# Patient Record
Sex: Female | Born: 1965 | Race: White | Hispanic: No | Marital: Single | Smoking: Former smoker
Health system: Southern US, Community
[De-identification: ages and names within clinical notes are randomized; demographics above are authoritative.]

## PROBLEM LIST (undated history)

## (undated) DIAGNOSIS — I639 Cerebral infarction, unspecified: Secondary | ICD-10-CM

## (undated) DIAGNOSIS — I509 Heart failure, unspecified: Secondary | ICD-10-CM

## (undated) DIAGNOSIS — J449 Chronic obstructive pulmonary disease, unspecified: Secondary | ICD-10-CM

## (undated) DIAGNOSIS — I10 Essential (primary) hypertension: Secondary | ICD-10-CM

## (undated) DIAGNOSIS — N189 Chronic kidney disease, unspecified: Secondary | ICD-10-CM

## (undated) DIAGNOSIS — E119 Type 2 diabetes mellitus without complications: Secondary | ICD-10-CM

## (undated) DIAGNOSIS — F0390 Unspecified dementia without behavioral disturbance: Secondary | ICD-10-CM

## (undated) DIAGNOSIS — D649 Anemia, unspecified: Secondary | ICD-10-CM

## (undated) DIAGNOSIS — R4701 Aphasia: Secondary | ICD-10-CM

---

## 2019-05-28 ENCOUNTER — Emergency Department (HOSPITAL_COMMUNITY): Payer: Medicaid Other

## 2019-05-28 ENCOUNTER — Encounter (HOSPITAL_COMMUNITY): Payer: Self-pay | Admitting: Student

## 2019-05-28 ENCOUNTER — Emergency Department (HOSPITAL_COMMUNITY)
Admission: EM | Admit: 2019-05-28 | Discharge: 2019-05-29 | Disposition: A | Discharge status: Home / Self Care | Payer: Medicaid Other | Attending: Emergency Medicine | Admitting: Emergency Medicine

## 2019-05-28 ENCOUNTER — Other Ambulatory Visit: Payer: Self-pay

## 2019-05-28 DIAGNOSIS — J449 Chronic obstructive pulmonary disease, unspecified: Secondary | ICD-10-CM | POA: Insufficient documentation

## 2019-05-28 DIAGNOSIS — E1122 Type 2 diabetes mellitus with diabetic chronic kidney disease: Secondary | ICD-10-CM | POA: Insufficient documentation

## 2019-05-28 DIAGNOSIS — N189 Chronic kidney disease, unspecified: Secondary | ICD-10-CM | POA: Insufficient documentation

## 2019-05-28 DIAGNOSIS — R519 Headache, unspecified: Secondary | ICD-10-CM | POA: Insufficient documentation

## 2019-05-28 DIAGNOSIS — I69351 Hemiplegia and hemiparesis following cerebral infarction affecting right dominant side: Secondary | ICD-10-CM | POA: Diagnosis not present

## 2019-05-28 DIAGNOSIS — Z7401 Bed confinement status: Secondary | ICD-10-CM | POA: Diagnosis not present

## 2019-05-28 DIAGNOSIS — I509 Heart failure, unspecified: Secondary | ICD-10-CM | POA: Diagnosis not present

## 2019-05-28 HISTORY — DX: Cerebral infarction, unspecified: I63.9

## 2019-05-28 HISTORY — DX: Heart failure, unspecified: I50.9

## 2019-05-28 HISTORY — DX: Chronic kidney disease, unspecified: N18.9

## 2019-05-28 HISTORY — DX: Type 2 diabetes mellitus without complications: E11.9

## 2019-05-28 HISTORY — DX: Chronic obstructive pulmonary disease, unspecified: J44.9

## 2019-05-28 HISTORY — DX: Essential (primary) hypertension: I10

## 2019-05-28 LAB — BASIC METABOLIC PANEL
Anion gap: 9 (ref 5–15)
BUN: 24 mg/dL — ABNORMAL HIGH (ref 6–20)
CO2: 25 mmol/L (ref 22–32)
Calcium: 8.8 mg/dL — ABNORMAL LOW (ref 8.9–10.3)
Chloride: 106 mmol/L (ref 98–111)
Creatinine, Ser: 1.16 mg/dL — ABNORMAL HIGH (ref 0.44–1.00)
GFR calc Af Amer: >60 mL/min (ref >60)
GFR calc non Af Amer: 53 mL/min — ABNORMAL LOW (ref >60)
Glucose, Bld: 131 mg/dL — ABNORMAL HIGH (ref 70–99)
Potassium: 5 mmol/L (ref 3.5–5.1)
Sodium: 140 mmol/L (ref 135–145)

## 2019-05-28 LAB — CBC
HCT: 44.5 % (ref 36.0–46.0)
Hemoglobin: 14.7 g/dL (ref 12.0–15.0)
MCH: 30.4 pg (ref 26.0–34.0)
MCHC: 33 g/dL (ref 30.0–36.0)
MCV: 91.9 fL (ref 80.0–100.0)
Platelets: 314 10*3/uL (ref 150–400)
RBC: 4.84 MIL/uL (ref 3.87–5.11)
RDW: 12.3 % (ref 11.5–15.5)
WBC: 9.6 10*3/uL (ref 4.0–10.5)
nRBC: 0 % (ref 0.0–0.2)

## 2019-05-28 LAB — TROPONIN I (HIGH SENSITIVITY)
Troponin I (High Sensitivity): 7 ng/L (ref <18)
Troponin I (High Sensitivity): 9 ng/L (ref <18)

## 2019-05-28 MED ORDER — ACETAMINOPHEN 325 MG PO TABS
650.0000 mg | ORAL_TABLET | Freq: Once | ORAL | Status: AC
  Administered 2019-05-28: 650 mg via ORAL
  Filled 2019-05-28: qty 2

## 2019-05-28 MED ORDER — DIPHENHYDRAMINE HCL 50 MG/ML IJ SOLN
12.5000 mg | Freq: Once | INTRAMUSCULAR | Status: AC
  Administered 2019-05-28: 12.5 mg via INTRAVENOUS
  Filled 2019-05-28: qty 1

## 2019-05-28 MED ORDER — PROCHLORPERAZINE EDISYLATE 10 MG/2ML IJ SOLN
10.0000 mg | Freq: Once | INTRAMUSCULAR | Status: AC
  Administered 2019-05-28: 23:00:00 10 mg via INTRAVENOUS
  Filled 2019-05-28: qty 2

## 2019-05-28 NOTE — ED Provider Notes (Signed)
[facility] EMERGENCY DEPARTMENT Provider Note   CSN: [id number] Arrival date & time: 05/28/19  1729     History Chief Complaint  Patient presents with   Headache    Julie Donovan is a 54 y.o. 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 Patient describes the headache as a tightness/pressure to the frontal region, gradual onset, steady progression, currently an 8/10 in severity, worse with bright lights, no alleviating factors. No intervention PTA. She states she has not necessarily had a headache like this in the past. Reports no acute change in her baseline R sided deficits s/p prior CVA. Denies visual disturbance, acute numbness/weakness, dizziness, syncope, nausea, vomiting, fever, or chills. Triage note documents chest pain with aspirin & NTG prior to arrival by EMS, however when I ask patient she denies any chest pain currently or any prior to arrival, she is alert & oriented.   HPI     No past medical history on file.  There are no problems to display for this patient.   History reviewed. No pertinent surgical history.   OB History   No obstetric history on file.     No family history on file.  Social History   Tobacco Use   Smoking status: Not on file  Substance Use Topics   Alcohol use: Not on file   Drug use: Not on file    Home Medications Prior to Admission medications   Not on File    Allergies    Patient has no allergy information on record.  Review of Systems   Review of Systems  Constitutional: Negative for chills and fever.  Respiratory: Negative for shortness of breath.   Cardiovascular: Negative for chest pain, palpitations and leg swelling.  Gastrointestinal: Negative for abdominal pain,  nausea and vomiting.  Genitourinary: Negative for dysuria.  Neurological: Positive for headaches. Negative for dizziness, seizures, syncope, facial asymmetry and numbness.       Positive for chronic right sided deficits s/p CVA.   All other systems reviewed and are negative.   Physical Exam Updated Vital Signs BP 128/78 (BP Location: Right Arm)    Pulse 79    Temp 98.1 F (36.7 C) (Oral)    Resp (!) 21    SpO2 99%   Physical Exam Vitals and nursing note reviewed.  Constitutional:      General: She is not in acute distress.    Appearance: She is well-developed. She is not toxic-appearing.  HENT:     Head: Normocephalic and atraumatic.  Eyes:     General:        Right eye: No discharge.        Left eye: No discharge.     Extraocular Movements: Extraocular movements intact.     Conjunctiva/sclera: Conjunctivae normal.     Pupils: Pupils are equal, round, and reactive to light.     Comments: No proptosis.   Cardiovascular:     Rate and Rhythm: Normal rate and regular rhythm.     Comments: 2+ symmetric radial pulses Pulmonary:     Effort: Pulmonary effort is normal. No respiratory distress.     Breath sounds: Normal breath sounds. No wheezing, rhonchi or rales.  Abdominal:     General: There is no distension.  Palpations: Abdomen is soft.     Tenderness: There is no abdominal tenderness.  Musculoskeletal:     Cervical back: Neck supple. No rigidity.  Skin:    General: Skin is warm and dry.     Findings: No rash.  Neurological:     Mental Status: She is alert.     Comments: Clear speech. No obvious facial droop. R sided deficits present which patient states are baseline. Intact finger to nose with LUE, unable to perform with RUE with her residual deficits.   Psychiatric:        Behavior: Behavior normal.    ED Results / Procedures / Treatments   Labs (all labs ordered are listed, but only abnormal results are displayed) Labs Reviewed  BASIC METABOLIC PANEL -  Abnormal; Notable for the following components:      Result Value   Glucose, Bld 131 (*)    BUN 24 (*)    Creatinine, Ser 1.16 (*)    Calcium 8.8 (*)    GFR calc non Af Amer 53 (*)    All other components within normal limits  CBC  TROPONIN I (HIGH SENSITIVITY)  TROPONIN I (HIGH SENSITIVITY)    EKG EKG Interpretation  Date/Time:  Monday May 28 2019 17:37:44 EDT Ventricular Rate:  84 PR Interval:  222 QRS Duration: 82 QT Interval:  410 QTC Calculation: 484 R Axis:   -15 Text Interpretation: Sinus rhythm with 1st degree A-V block Low voltage QRS Inferior infarct , age undetermined Cannot rule out Anterior infarct , age undetermined Abnormal ECG No old tracing to compare Confirmed by Deno Etienne [id number]) on 05/28/2019 10:07:44 PM   Radiology DG Chest 2 View  Result Date: 05/28/2019 CLINICAL DATA:  54 year old female with chest pain. EXAM: CHEST - 2 VIEW COMPARISON:  None FINDINGS: No focal consolidation, pleural effusion or pneumothorax. Mild cardiomegaly. No acute osseous pathology. IMPRESSION: No active cardiopulmonary disease. Electronically Signed   By: Anner Crete M.D.   On: 05/28/2019 23:35   CT Head Wo Contrast  Result Date: 05/28/2019 CLINICAL DATA:  54 year old female with headache. EXAM: CT HEAD WITHOUT CONTRAST TECHNIQUE: Contiguous axial images were obtained from the base of the skull through the vertex without intravenous contrast. COMPARISON:  None. FINDINGS: Brain: There is mild age-related atrophy and chronic microvascular ischemic changes. Small bilateral basal ganglia old lacunar infarcts. There is no acute intracranial hemorrhage. No mass effect or midline shift. No extra-axial fluid collection. Vascular: No hyperdense vessel or unexpected calcification. Skull: Normal. Negative for fracture or focal lesion. Sinuses/Orbits: No acute finding. Other: None IMPRESSION: 1. No acute intracranial pathology. 2. Mild age-related atrophy and chronic microvascular ischemic  changes. Electronically Signed   By: Anner Crete M.D.   On: 05/28/2019 23:12    Procedures Procedures (including critical care time)  Medications Ordered in ED Medications  prochlorperazine (COMPAZINE) injection 10 mg (10 mg Intravenous Given 05/28/19 2314)  diphenhydrAMINE (BENADRYL) injection 12.5 mg (12.5 mg Intravenous Given 05/28/19 2314)  acetaminophen (TYLENOL) tablet 650 mg (650 mg Oral Given 05/28/19 2314)    ED Course  I have reviewed the triage vital signs and the nursing notes.  Pertinent labs & imaging results that were available during my care of the patient were reviewed by me and considered in my medical decision making (see chart for details).    MDM Rules/Calculators/A&P                     Patient presents to the ED with  chief complaint of headache on my assessment, per triage chest pain, but patient denies this to me. Patient is nontoxic, vitals without significant abnormality. Benign physical exam, R sided deficits from prior CVA noted.   Additional history obtained:  Additional history obtained from review of nursing notes as well as rehab facility notes brought by patient (medical history, med list, allergies)  EKG: No STEMI.  Lab Tests:  Reviewed and interpreted labs, which included:  CBC: No anemia or leukocytosis.  BMP: Mild elevation of BUN/creatinine - no prior on record but has hx of CKD. Mild hypocalcemia.  Troponin: 7,9 No significant elevation.    Imaging Studies ordered:  Triage ordered CXR and I have ordered CT head wo contrast, I independently visualized and interpreted imaging which showed:  CXR: No active cardiopulmonary disease CT head wo: 1. No acute intracranial pathology. 2. Mild age-related atrophy and chronic microvascular ischemic changes.  ED Course:  22:10: Initial assessment of patient, complaining of 8/10 severity headache- no hx of prior headache that is similar therefore CT head wo contrast ordered, will trial migraine cocktail.    00:30: RE-EVAL: Patient woken from sleep, states she is feeling much better and is ready to go home.   Headache:  No acute neuro deficits today (baseline s/p ischemic CVA) low suspicion for acute stroke No acute bleed or obvious masses on head CT No sudden onset headache, low suspicion for  [facility].  No fever or nuchal rigidity, low suspicion for meningitis.  No visual disturbance or proptosis with diffuse frontal headache therefore low suspicion for acute angle closure glaucoma, dural venous sinus thrombosis, or temporal arteritis.  Chest pain: Initially reported, but denied to me, EKG without STEMI, troponins without significant elevation, doubt ACS.  Symmetric pulses, no widened mediastinum on chest x-ray, doubt dissection.  She is not hypoxic or tachycardic, doubt PE.  Chest x-ray without pneumonia, pneumothorax, or fluid overload.  Overall reassuring work-up in the emergency department, patient feeling improved, she overall appears appropriate for discharge at this time.  Recommended Tylenol as needed for discomfort. I discussed results, treatment plan, need for follow-up, and return precautions with the patient. Provided opportunity for questions, patient confirmed understanding and is in agreement with plan.   Findings and plan of care discussed with supervising physician Dr. Adela Lank who is in agreement.   Portions of this note were generated with Scientist, clinical (histocompatibility and immunogenetics). Dictation errors may occur despite best attempts at proofreading.  Final Clinical Impression(s) / ED Diagnoses Final diagnoses:  Acute nonintractable headache, unspecified headache type    Rx / DC Orders ED Discharge Orders    None       Cherly Anderson, PA-C 05/29/19 0114    Melene Plan, DO 05/29/19 1502

## 2019-05-28 NOTE — ED Notes (Signed)
Pt transported to xray 

## 2019-05-28 NOTE — ED Notes (Signed)
Pt. returned from XR. 

## 2019-05-28 NOTE — ED Notes (Signed)
Pt states that labs can't be drawn from her hands

## 2019-05-28 NOTE — ED Triage Notes (Addendum)
Pt from [facility] [facility] with ems for chest pain that started about 30 mins prior to EMS arrival, pt bedbound due to hx of stroke. Pt alert, oriented at baseline with confusion. VSS. 18G LAC Pt given 324 ASA and 1 nitro with some relief of pain

## 2019-05-29 NOTE — Discharge Instructions (Signed)
You were seen in the emergency department this evening for a headache, there was also some concern that you are having chest pain prior to arrival.  Your work-up in the ER was overall reassuring.  Your head CT and chest x-ray did not show any acute abnormalities.  Your labs were overall reassuring, your kidney function was mildly elevated, please have this rechecked by your primary care provider within 1 to 2 weeks.  You were given a migraine cocktail in the ER with improvement.  Please take Tylenol as needed per over-the-counter dosing to help with discomfort should it return.  Please follow-up with your primary care provider within 3 days for reevaluation.  Return to the ER for new or worsening symptoms or any other concerns.

## 2019-05-29 NOTE — ED Notes (Signed)
[facility] called to transfer home

## 2020-04-25 DIAGNOSIS — R6521 Severe sepsis with septic shock: Secondary | ICD-10-CM

## 2020-04-25 DIAGNOSIS — N1330 Unspecified hydronephrosis: Secondary | ICD-10-CM

## 2020-04-25 DIAGNOSIS — A419 Sepsis, unspecified organism: Secondary | ICD-10-CM

## 2020-04-25 HISTORY — DX: Sepsis, unspecified organism: A41.9

## 2020-04-25 HISTORY — DX: Unspecified hydronephrosis: N13.30

## 2020-04-25 HISTORY — DX: Severe sepsis with septic shock: R65.21

## 2020-05-10 ENCOUNTER — Other Ambulatory Visit: Payer: Self-pay

## 2020-05-10 ENCOUNTER — Inpatient Hospital Stay (HOSPITAL_COMMUNITY)
Admission: EM | Admit: 2020-05-10 | Discharge: 2020-05-22 | DRG: 853 | Disposition: A | Discharge status: Skilled Nursing Facility | Payer: Medicaid Other | Source: Skilled Nursing Facility | Attending: Internal Medicine | Admitting: Internal Medicine

## 2020-05-10 ENCOUNTER — Emergency Department (HOSPITAL_COMMUNITY): Payer: Medicaid Other

## 2020-05-10 ENCOUNTER — Encounter (HOSPITAL_COMMUNITY): Payer: Self-pay

## 2020-05-10 DIAGNOSIS — Z20822 Contact with and (suspected) exposure to covid-19: Secondary | ICD-10-CM | POA: Diagnosis present

## 2020-05-10 DIAGNOSIS — Z79899 Other long term (current) drug therapy: Secondary | ICD-10-CM

## 2020-05-10 DIAGNOSIS — E1165 Type 2 diabetes mellitus with hyperglycemia: Secondary | ICD-10-CM | POA: Diagnosis present

## 2020-05-10 DIAGNOSIS — A4159 Other Gram-negative sepsis: Principal | ICD-10-CM | POA: Diagnosis present

## 2020-05-10 DIAGNOSIS — E8809 Other disorders of plasma-protein metabolism, not elsewhere classified: Secondary | ICD-10-CM

## 2020-05-10 DIAGNOSIS — G9341 Metabolic encephalopathy: Secondary | ICD-10-CM | POA: Diagnosis not present

## 2020-05-10 DIAGNOSIS — D696 Thrombocytopenia, unspecified: Secondary | ICD-10-CM | POA: Diagnosis present

## 2020-05-10 DIAGNOSIS — I69351 Hemiplegia and hemiparesis following cerebral infarction affecting right dominant side: Secondary | ICD-10-CM

## 2020-05-10 DIAGNOSIS — I69320 Aphasia following cerebral infarction: Secondary | ICD-10-CM

## 2020-05-10 DIAGNOSIS — Z794 Long term (current) use of insulin: Secondary | ICD-10-CM

## 2020-05-10 DIAGNOSIS — R1312 Dysphagia, oropharyngeal phase: Secondary | ICD-10-CM | POA: Diagnosis not present

## 2020-05-10 DIAGNOSIS — R739 Hyperglycemia, unspecified: Secondary | ICD-10-CM

## 2020-05-10 DIAGNOSIS — E872 Acidosis, unspecified: Secondary | ICD-10-CM

## 2020-05-10 DIAGNOSIS — I313 Pericardial effusion (noninflammatory): Secondary | ICD-10-CM | POA: Diagnosis present

## 2020-05-10 DIAGNOSIS — N1831 Chronic kidney disease, stage 3a: Secondary | ICD-10-CM | POA: Diagnosis present

## 2020-05-10 DIAGNOSIS — F32A Depression, unspecified: Secondary | ICD-10-CM | POA: Diagnosis present

## 2020-05-10 DIAGNOSIS — J9811 Atelectasis: Secondary | ICD-10-CM | POA: Diagnosis present

## 2020-05-10 DIAGNOSIS — Z87891 Personal history of nicotine dependence: Secondary | ICD-10-CM

## 2020-05-10 DIAGNOSIS — Z6841 Body Mass Index (BMI) 40.0 and over, adult: Secondary | ICD-10-CM

## 2020-05-10 DIAGNOSIS — G9349 Other encephalopathy: Secondary | ICD-10-CM | POA: Diagnosis not present

## 2020-05-10 DIAGNOSIS — E118 Type 2 diabetes mellitus with unspecified complications: Secondary | ICD-10-CM

## 2020-05-10 DIAGNOSIS — J449 Chronic obstructive pulmonary disease, unspecified: Secondary | ICD-10-CM | POA: Diagnosis present

## 2020-05-10 DIAGNOSIS — Z419 Encounter for procedure for purposes other than remedying health state, unspecified: Secondary | ICD-10-CM

## 2020-05-10 DIAGNOSIS — J9601 Acute respiratory failure with hypoxia: Secondary | ICD-10-CM | POA: Diagnosis not present

## 2020-05-10 DIAGNOSIS — Z960 Presence of urogenital implants: Secondary | ICD-10-CM

## 2020-05-10 DIAGNOSIS — F0390 Unspecified dementia without behavioral disturbance: Secondary | ICD-10-CM | POA: Diagnosis present

## 2020-05-10 DIAGNOSIS — D6489 Other specified anemias: Secondary | ICD-10-CM | POA: Diagnosis present

## 2020-05-10 DIAGNOSIS — I5032 Chronic diastolic (congestive) heart failure: Secondary | ICD-10-CM | POA: Diagnosis present

## 2020-05-10 DIAGNOSIS — Z7982 Long term (current) use of aspirin: Secondary | ICD-10-CM

## 2020-05-10 DIAGNOSIS — E785 Hyperlipidemia, unspecified: Secondary | ICD-10-CM | POA: Diagnosis present

## 2020-05-10 DIAGNOSIS — N189 Chronic kidney disease, unspecified: Secondary | ICD-10-CM

## 2020-05-10 DIAGNOSIS — A419 Sepsis, unspecified organism: Secondary | ICD-10-CM | POA: Diagnosis present

## 2020-05-10 DIAGNOSIS — E1122 Type 2 diabetes mellitus with diabetic chronic kidney disease: Secondary | ICD-10-CM | POA: Diagnosis present

## 2020-05-10 DIAGNOSIS — N201 Calculus of ureter: Secondary | ICD-10-CM

## 2020-05-10 DIAGNOSIS — G40909 Epilepsy, unspecified, not intractable, without status epilepticus: Secondary | ICD-10-CM | POA: Diagnosis present

## 2020-05-10 DIAGNOSIS — I69391 Dysphagia following cerebral infarction: Secondary | ICD-10-CM

## 2020-05-10 DIAGNOSIS — R6521 Severe sepsis with septic shock: Secondary | ICD-10-CM | POA: Diagnosis present

## 2020-05-10 DIAGNOSIS — E876 Hypokalemia: Secondary | ICD-10-CM | POA: Diagnosis present

## 2020-05-10 DIAGNOSIS — Z91040 Latex allergy status: Secondary | ICD-10-CM

## 2020-05-10 DIAGNOSIS — I130 Hypertensive heart and chronic kidney disease with heart failure and stage 1 through stage 4 chronic kidney disease, or unspecified chronic kidney disease: Secondary | ICD-10-CM | POA: Diagnosis present

## 2020-05-10 DIAGNOSIS — R4182 Altered mental status, unspecified: Secondary | ICD-10-CM

## 2020-05-10 DIAGNOSIS — N132 Hydronephrosis with renal and ureteral calculous obstruction: Secondary | ICD-10-CM | POA: Diagnosis present

## 2020-05-10 DIAGNOSIS — R6520 Severe sepsis without septic shock: Secondary | ICD-10-CM

## 2020-05-10 DIAGNOSIS — D72829 Elevated white blood cell count, unspecified: Secondary | ICD-10-CM

## 2020-05-10 DIAGNOSIS — N136 Pyonephrosis: Secondary | ICD-10-CM | POA: Diagnosis present

## 2020-05-10 DIAGNOSIS — N179 Acute kidney failure, unspecified: Secondary | ICD-10-CM | POA: Diagnosis present

## 2020-05-10 DIAGNOSIS — E46 Unspecified protein-calorie malnutrition: Secondary | ICD-10-CM | POA: Diagnosis present

## 2020-05-10 LAB — CBC WITH DIFFERENTIAL/PLATELET
Abs Immature Granulocytes: 0.36 10*3/uL — ABNORMAL HIGH (ref 0.00–0.07)
Basophils Absolute: 0.1 10*3/uL (ref 0.0–0.1)
Basophils Relative: 0 %
Eosinophils Absolute: 0 10*3/uL (ref 0.0–0.5)
Eosinophils Relative: 0 %
HCT: 45.1 % (ref 36.0–46.0)
Hemoglobin: 15 g/dL (ref 12.0–15.0)
Immature Granulocytes: 2 %
Lymphocytes Relative: 4 %
Lymphs Abs: 0.8 10*3/uL (ref 0.7–4.0)
MCH: 30.7 pg (ref 26.0–34.0)
MCHC: 33.3 g/dL (ref 30.0–36.0)
MCV: 92.2 fL (ref 80.0–100.0)
Monocytes Absolute: 1.2 10*3/uL — ABNORMAL HIGH (ref 0.1–1.0)
Monocytes Relative: 6 %
Neutro Abs: 18.6 10*3/uL — ABNORMAL HIGH (ref 1.7–7.7)
Neutrophils Relative %: 88 %
Platelets: 237 10*3/uL (ref 150–400)
RBC: 4.89 MIL/uL (ref 3.87–5.11)
RDW: 12.8 % (ref 11.5–15.5)
WBC: 21 10*3/uL — ABNORMAL HIGH (ref 4.0–10.5)
nRBC: 0.1 % (ref 0.0–0.2)

## 2020-05-10 LAB — CBG MONITORING, ED: Glucose-Capillary: 135 mg/dL — ABNORMAL HIGH (ref 70–99)

## 2020-05-10 LAB — PROTIME-INR
INR: 1.4 — ABNORMAL HIGH (ref 0.8–1.2)
Prothrombin Time: 16.9 seconds — ABNORMAL HIGH (ref 11.4–15.2)

## 2020-05-10 LAB — COMPREHENSIVE METABOLIC PANEL
ALT: 37 U/L (ref 0–44)
AST: 29 U/L (ref 15–41)
Albumin: 3.1 g/dL — ABNORMAL LOW (ref 3.5–5.0)
Alkaline Phosphatase: 96 U/L (ref 38–126)
Anion gap: 11 (ref 5–15)
BUN: 32 mg/dL — ABNORMAL HIGH (ref 6–20)
CO2: 20 mmol/L — ABNORMAL LOW (ref 22–32)
Calcium: 8.5 mg/dL — ABNORMAL LOW (ref 8.9–10.3)
Chloride: 105 mmol/L (ref 98–111)
Creatinine, Ser: 2.5 mg/dL — ABNORMAL HIGH (ref 0.44–1.00)
GFR, Estimated: 22 mL/min — ABNORMAL LOW (ref >60)
Glucose, Bld: 145 mg/dL — ABNORMAL HIGH (ref 70–99)
Potassium: 3.7 mmol/L (ref 3.5–5.1)
Sodium: 136 mmol/L (ref 135–145)
Total Bilirubin: 0.5 mg/dL (ref 0.3–1.2)
Total Protein: 6.4 g/dL — ABNORMAL LOW (ref 6.5–8.1)

## 2020-05-10 LAB — RESP PANEL BY RT-PCR (FLU A&B, COVID) ARPGX2
Influenza A by PCR: NEGATIVE
Influenza B by PCR: NEGATIVE
SARS Coronavirus 2 by RT PCR: NEGATIVE

## 2020-05-10 LAB — LACTIC ACID, PLASMA: Lactic Acid, Venous: 2.5 mmol/L (ref 0.5–1.9)

## 2020-05-10 MED ORDER — VANCOMYCIN HCL 2000 MG/400ML IV SOLN
2000.0000 mg | Freq: Once | INTRAVENOUS | Status: AC
  Administered 2020-05-10: 2000 mg via INTRAVENOUS
  Filled 2020-05-10: qty 400

## 2020-05-10 MED ORDER — METRONIDAZOLE IN NACL 5-0.79 MG/ML-% IV SOLN
500.0000 mg | Freq: Once | INTRAVENOUS | Status: AC
  Administered 2020-05-10: 500 mg via INTRAVENOUS
  Filled 2020-05-10: qty 100

## 2020-05-10 MED ORDER — SODIUM CHLORIDE 0.9 % IV SOLN: 2.0000 g | INTRAVENOUS | Status: DC

## 2020-05-10 MED ORDER — MIDAZOLAM HCL 2 MG/2ML IJ SOLN
INTRAMUSCULAR | Status: AC
  Filled 2020-05-10: qty 2

## 2020-05-10 MED ORDER — VANCOMYCIN VARIABLE DOSE PER UNSTABLE RENAL FUNCTION (PHARMACIST DOSING): Status: DC

## 2020-05-10 MED ORDER — LACTATED RINGERS IV BOLUS (SEPSIS)
1000.0000 mL | Freq: Once | INTRAVENOUS | Status: AC
  Administered 2020-05-10: 1000 mL via INTRAVENOUS

## 2020-05-10 MED ORDER — FENTANYL CITRATE (PF) 250 MCG/5ML IJ SOLN
INTRAMUSCULAR | Status: AC
  Filled 2020-05-10: qty 5

## 2020-05-10 MED ORDER — VANCOMYCIN HCL 1000 MG/200ML IV SOLN
1000.0000 mg | Freq: Once | INTRAVENOUS | Status: DC
  Filled 2020-05-10: qty 200

## 2020-05-10 MED ORDER — PROPOFOL 10 MG/ML IV BOLUS
INTRAVENOUS | Status: AC
  Filled 2020-05-10: qty 20

## 2020-05-10 MED ORDER — LACTATED RINGERS IV SOLN: INTRAVENOUS | Status: AC

## 2020-05-10 MED ORDER — SODIUM CHLORIDE 0.9 % IV SOLN
2.0000 g | Freq: Once | INTRAVENOUS | Status: DC
  Filled 2020-05-10: qty 2

## 2020-05-10 NOTE — ED Provider Notes (Signed)
[facility] Note   CSN: [id number] Arrival date & time: 05/10/20  1853     History Chief Complaint  Patient presents with  . Altered Mental Status    Julie AcheBarbara Donovan is a 55 y.o. female with a past medical history of COPD, CKD, CHF, CVA, hypertension, DM 2, right-sided chronic deficits who presents today for evaluation of altered mental status.  Level 5 caveat altered mental status.  Patient reportedly has been having worsening mental status since yesterday.  On paper sent with her from the facility it appears that she had a 1 view KUB performed today due to vomiting dark material which showed concern for a possible early ileus. EMS reports that initially patient's blood pressure was in the 70s systolic.  She was given 500 cc IV fluids after which her blood pressure improved into the 90s.  She additionally was febrile with EMS and was reportedly given 650 of Tylenol. At baseline patient is alert and oriented to person, place, and time.  HPI     Past Medical History:  Diagnosis Date  . CHF (congestive heart failure) (HCC)   . CKD (chronic kidney disease)   . COPD (chronic obstructive pulmonary disease) (HCC)   . CVA (cerebral vascular accident) (HCC)   . Hypertension   . Type 2 diabetes mellitus (HCC)     There are no problems to display for this patient.   History reviewed. No pertinent surgical history.   OB History   No obstetric history on file.     No family history on file.  Social History   Tobacco Use  . Smoking status: Former Games developermoker  . Smokeless tobacco: Former Engineer, waterUser  Substance Use Topics  . Alcohol use: Not Currently  . Drug use: Not Currently    Home Medications Prior to Admission medications   Medication Sig Start Date End Date Taking? Authorizing Provider  acetaminophen (TYLENOL) 500 MG tablet Take 1,000 mg by mouth in the morning and at bedtime.   Yes [provider]  amLODipine (NORVASC)  10 MG tablet Take 10 mg by mouth daily.   Yes [provider]  ARIPiprazole (ABILIFY) 2 MG tablet Take 2 mg by mouth at bedtime.   Yes [provider]  aspirin 325 MG EC tablet Take 325 mg by mouth daily.   Yes [provider]  atorvastatin (LIPITOR) 80 MG tablet Take 80 mg by mouth at bedtime.   Yes [provider]  bisacodyl (DULCOLAX) 10 MG suppository Place 10 mg rectally as needed for moderate constipation.   Yes [provider]  calcium carbonate (TUMS - DOSED IN MG ELEMENTAL CALCIUM) 500 MG chewable tablet Chew 2 tablets by mouth 2 (two) times daily.   Yes [provider]  carvedilol (COREG) 12.5 MG tablet Take 12.5 mg by mouth 2 (two) times daily with a meal.   Yes [provider]  cholecalciferol (VITAMIN D3) 25 MCG (1000 UNIT) tablet Take 1,000 Units by mouth daily.   Yes [provider]  cyclobenzaprine (FLEXERIL) 10 MG tablet Take 10 mg by mouth at bedtime.   Yes [provider]  dicyclomine (BENTYL) 10 MG capsule Take 10 mg by mouth every 8 (eight) hours as needed for spasms.   Yes [provider]  FLUoxetine (PROZAC) 20 MG capsule Take 60 mg by mouth at bedtime.   Yes [provider]  hydrALAZINE (APRESOLINE) 25 MG tablet Take 75 mg by mouth 3 (three) times daily as needed (  for Hypertenstion (Hold for systolic less than 140 and diastolic less than 95)).   Yes [provider]  hyoscyamine (LEVSIN) 0.125 MG tablet Take 0.125 mg by mouth every 8 (eight) hours as needed (for IBS).   Yes [provider]  insulin glargine (LANTUS) 100 UNIT/ML injection Inject 41 Units into the skin daily.   Yes [provider]  insulin lispro (HUMALOG) 100 UNIT/ML injection Inject 2-12 Units into the skin 4 (four) times daily -  before meals and at bedtime. If 201-250=2 units 251-300=4 units 301-350=6 units 351-400=8 units 401-450=10 units 451-500=12 units If Glucose Reads High=14  units and recheck   Yes [provider]  levETIRAcetam (KEPPRA) 500 MG tablet Take 500 mg by mouth 2 (two) times daily.   Yes [provider]  linagliptin (TRADJENTA) 5 MG TABS tablet Take 5 mg by mouth daily.   Yes [provider]  lisinopril (ZESTRIL) 5 MG tablet Take 5 mg by mouth daily.   Yes [provider]  polyethylene glycol powder (GLYCOLAX/MIRALAX) 17 GM/SCOOP powder Take 17 g by mouth daily.   Yes [provider]  promethazine (PHENERGAN) 25 MG tablet Take 25 mg by mouth every 6 (six) hours as needed for nausea or vomiting.   Yes [provider]  senna-docusate (SENOKOT-S) 8.6-50 MG tablet Take 2 tablets by mouth 2 (two) times daily.   Yes [provider]  aspirin 81 MG chewable tablet Chew 81 mg by mouth daily. Patient not taking: Reported on 05/10/2020    [provider]  calcium-vitamin D (OSCAL WITH D) 500-200 MG-UNIT tablet Take 1 tablet by mouth daily with breakfast. Patient not taking: Reported on 05/10/2020    [provider]  vitamin B-12 (CYANOCOBALAMIN) 500 MCG tablet Take 500 mcg by mouth daily. Patient not taking: Reported on 05/10/2020    [provider]    Allergies    Latex  Review of Systems   Review of Systems  Unable to perform ROS: Mental status change    Physical Exam Updated Vital Signs BP 116/86   Pulse (!) 115   Temp 100.1 F (37.8 C) (Rectal)   Resp 20   Ht 5\' 4"  (1.626 m)   Wt 106.6 kg   SpO2 97%   BMI 40.34 kg/m   Physical Exam Vitals and nursing note reviewed.  Constitutional:      Appearance: She is obese. She is ill-appearing and diaphoretic.     Comments: Patient is cool to the touch and diaphoretic.  Her eyes are open and she tracks.    HENT:     Head: Normocephalic and atraumatic.  Eyes:     General: No scleral icterus.       Right eye: No discharge.        Left eye: No discharge.     Conjunctiva/sclera: Conjunctivae normal.   Cardiovascular:     Rate and Rhythm: Regular rhythm. Tachycardia present.  Pulmonary:     Effort: Pulmonary effort is normal. No respiratory distress.     Breath sounds: No stridor.  Abdominal:     General: There is no distension.     Tenderness: There is no abdominal tenderness. There is no guarding.  Musculoskeletal:        General: No deformity.     Cervical back: Normal range of motion and neck supple.  Skin:    General: Skin is cool and moist.     Coloration: Skin is pale.  Neurological:     GCS:  GCS eye subscore is 4. GCS verbal subscore is 2. GCS motor subscore is 6.     Motor: Abnormal muscle tone present.     Comments: Patient is awake.  She is able to follow commands including wiggling fingers and toes on the left side (chronic right-sided deficits from prior CVA) and is able to close her eyes when instructed to.  She quietly mumble something incomprehensible when asked questions.  Unable to clearly tell me her name.  Psychiatric:     Comments: AMS, unable to assess     ED Results / Procedures / Treatments   Labs (all labs ordered are listed, but only abnormal results are displayed) Labs Reviewed  COMPREHENSIVE METABOLIC PANEL - Abnormal; Notable for the following components:      Result Value   CO2 20 (*)    Glucose, Bld 145 (*)    BUN 32 (*)    Creatinine, Ser 2.50 (*)    Calcium 8.5 (*)    Total Protein 6.4 (*)    Albumin 3.1 (*)    GFR, Estimated 22 (*)    All other components within normal limits  LACTIC ACID, PLASMA - Abnormal; Notable for the following components:   Lactic Acid, Venous 2.5 (*)    All other components within normal limits  CBC WITH DIFFERENTIAL/PLATELET - Abnormal; Notable for the following components:   WBC 21.0 (*)    Neutro Abs 18.6 (*)    Monocytes Absolute 1.2 (*)    Abs Immature Granulocytes 0.36 (*)    All other components within normal limits  PROTIME-INR - Abnormal; Notable for the following components:   Prothrombin Time  16.9 (*)    INR 1.4 (*)    All other components within normal limits  CBG MONITORING, ED - Abnormal; Notable for the following components:   Glucose-Capillary 135 (*)    All other components within normal limits  RESP PANEL BY RT-PCR (FLU A&B, COVID) ARPGX2  CULTURE, BLOOD (ROUTINE X 2)  CULTURE, BLOOD (ROUTINE X 2)  URINE CULTURE  LACTIC ACID, PLASMA  URINALYSIS, ROUTINE W REFLEX MICROSCOPIC  I-STAT CHEM 8, ED    EKG None  Radiology CT Abdomen Pelvis Wo Contrast  Result Date: 05/10/2020 CLINICAL DATA:  Sepsis, nausea, vomiting, altered mental status EXAM: CT CHEST, ABDOMEN AND PELVIS WITHOUT CONTRAST TECHNIQUE: Multidetector CT imaging of the chest, abdomen and pelvis was performed following the standard protocol without IV contrast. COMPARISON:  None. FINDINGS: CT CHEST FINDINGS Cardiovascular: Cardiomegaly. Moderate pericardial effusion. Probable stents in the left anterior descending coronary artery. Aorta normal caliber. Mediastinum/Nodes: No mediastinal, hilar, or axillary adenopathy. Trachea and esophagus are unremarkable. Thyroid unremarkable. Lungs/Pleura: Bibasilar atelectasis. No effusions or other confluent opacities. Musculoskeletal: Chest wall soft tissues are unremarkable. No acute bony abnormality. CT ABDOMEN PELVIS FINDINGS Hepatobiliary: No focal liver abnormality is seen. Status post cholecystectomy. No biliary dilatation. Pancreas: No focal abnormality or ductal dilatation. Spleen: No focal abnormality.  Normal size. Adrenals/Urinary Tract: Adrenal glands are unremarkable. Numerous bilateral renal stones the large stone on the right layering dependently in the renal pelvis measuring 9 mm and the largest on the left layering dependently in the renal pelvis measuring 7 mm. Proximal right ureteral stone measures 8 mm. Mild right hydronephrosis. 2 mm distal left ureteral stone. Mild pelvicaliectasis on the left. Urinary bladder decompressed, unremarkable. Stomach/Bowel: Stomach,  large and small bowel grossly unremarkable. Vascular/Lymphatic: No evidence of aneurysm or adenopathy. Scattered aortoiliac atherosclerosis. Reproductive: Uterus and adnexa unremarkable.  No mass. Other: Trace  free fluid in the pelvis.  No free air. Musculoskeletal: No acute bony abnormality. IMPRESSION: Cardiomegaly.  Moderate pericardial effusion. Bibasilar atelectasis. Bilateral nephrolithiasis. Mild right hydronephrosis due to 8 mm proximal right ureteral stone. Mild left pelvicaliectasis due to 2 mm left UVJ stone. Electronically Signed   By: Charlett Nose M.D.   On: 05/10/2020 22:04   CT Head Wo Contrast  Result Date: 05/10/2020 CLINICAL DATA:  Altered mental status, sepsis EXAM: CT HEAD WITHOUT CONTRAST TECHNIQUE: Contiguous axial images were obtained from the base of the skull through the vertex without intravenous contrast. COMPARISON:  05/28/2019 FINDINGS: Brain: Bilateral basal ganglia and periventricular white matter lacunar infarcts, stable. There is atrophy and chronic small vessel disease changes. No acute intracranial abnormality. Specifically, no hemorrhage, hydrocephalus, mass lesion, acute infarction, or significant intracranial injury. Vascular: No hyperdense vessel or unexpected calcification. Skull: No acute calvarial abnormality. Sinuses/Orbits: Visualized paranasal sinuses and mastoids clear. Orbital soft tissues unremarkable. Other: None IMPRESSION: Bilateral lacunar infarcts as above, chronic. Atrophy, chronic microvascular disease. No acute intracranial abnormality. Electronically Signed   By: Charlett Nose M.D.   On: 05/10/2020 21:48   CT Chest Wo Contrast  Result Date: 05/10/2020 CLINICAL DATA:  Sepsis, nausea, vomiting, altered mental status EXAM: CT CHEST, ABDOMEN AND PELVIS WITHOUT CONTRAST TECHNIQUE: Multidetector CT imaging of the chest, abdomen and pelvis was performed following the standard protocol without IV contrast. COMPARISON:  None. FINDINGS: CT CHEST FINDINGS  Cardiovascular: Cardiomegaly. Moderate pericardial effusion. Probable stents in the left anterior descending coronary artery. Aorta normal caliber. Mediastinum/Nodes: No mediastinal, hilar, or axillary adenopathy. Trachea and esophagus are unremarkable. Thyroid unremarkable. Lungs/Pleura: Bibasilar atelectasis. No effusions or other confluent opacities. Musculoskeletal: Chest wall soft tissues are unremarkable. No acute bony abnormality. CT ABDOMEN PELVIS FINDINGS Hepatobiliary: No focal liver abnormality is seen. Status post cholecystectomy. No biliary dilatation. Pancreas: No focal abnormality or ductal dilatation. Spleen: No focal abnormality.  Normal size. Adrenals/Urinary Tract: Adrenal glands are unremarkable. Numerous bilateral renal stones the large stone on the right layering dependently in the renal pelvis measuring 9 mm and the largest on the left layering dependently in the renal pelvis measuring 7 mm. Proximal right ureteral stone measures 8 mm. Mild right hydronephrosis. 2 mm distal left ureteral stone. Mild pelvicaliectasis on the left. Urinary bladder decompressed, unremarkable. Stomach/Bowel: Stomach, large and small bowel grossly unremarkable. Vascular/Lymphatic: No evidence of aneurysm or adenopathy. Scattered aortoiliac atherosclerosis. Reproductive: Uterus and adnexa unremarkable.  No mass. Other: Trace free fluid in the pelvis.  No free air. Musculoskeletal: No acute bony abnormality. IMPRESSION: Cardiomegaly.  Moderate pericardial effusion. Bibasilar atelectasis. Bilateral nephrolithiasis. Mild right hydronephrosis due to 8 mm proximal right ureteral stone. Mild left pelvicaliectasis due to 2 mm left UVJ stone. Electronically Signed   By: Charlett Nose M.D.   On: 05/10/2020 22:04   DG Chest Portable 1 View  Result Date: 05/10/2020 CLINICAL DATA:  Sepsis EXAM: PORTABLE CHEST 1 VIEW COMPARISON:  05/28/2019 FINDINGS: Cardiomegaly. Low lung volumes with bibasilar atelectasis. No effusions or  overt edema. No acute bony abnormality. IMPRESSION: Low lung volumes.  Cardiomegaly, bibasilar atelectasis. Electronically Signed   By: Charlett Nose M.D.   On: 05/10/2020 20:00    Procedures .Critical Care Performed by: Cristina Gong, PA-C Authorized by: Cristina Gong, PA-C   Critical care provider statement:    Critical care time (minutes):  45   Critical care was time spent personally by me on the following activities:  Discussions with consultants, evaluation of patient's response to  treatment, examination of patient, ordering and performing treatments and interventions, ordering and review of laboratory studies, ordering and review of radiographic studies, pulse oximetry, re-evaluation of patient's condition, obtaining history from patient or surrogate and review of old charts     Medications Ordered in ED Medications  lactated ringers infusion ( Intravenous New Bag/Given 05/10/20 2202)  ceFEPIme (MAXIPIME) 2 g in sodium chloride 0.9 % 100 mL IVPB (has no administration in time range)  vancomycin (VANCOREADY) IVPB 2000 mg/400 mL (2,000 mg Intravenous Incomplete 05/10/20 2211)  vancomycin variable dose per unstable renal function (pharmacist dosing) (has no administration in time range)  ceFEPIme (MAXIPIME) 2 g in sodium chloride 0.9 % 100 mL IVPB (has no administration in time range)  lactated ringers bolus 1,000 mL (0 mLs Intravenous Stopped 05/10/20 2010)  metroNIDAZOLE (FLAGYL) IVPB 500 mg (0 mg Intravenous Stopped 05/10/20 2111)    ED Course  I have reviewed the triage vital signs and the nursing notes.  Pertinent labs & imaging results that were available during my care of the patient were reviewed by me and considered in my medical decision making (see chart for details).  Clinical Course as of 05/10/20 2343  Sat May 10, 2020  1932 Patient's mental status is already starting to improve.  Initially she could not tell me her name, now she is able to tell me her name  and that she is at the hospital. [EH]  1951 WBC(!): 21.0 [EH]  2021 I went to reevaluate patient, she had pulled out her IV.  Nursing was able to get a 22 in her left hand.  IV fluids infusing. [EH]  2240 CT Abdomen Pelvis Wo Contrast CT scan shows bilateral obstructing stones.  [EH]  2255 I spoke with Dr. Mena Goes of urology who requested patient be kept n.p.o. and admitted.  PCCM consult is placed. [EH]  2315 I spoke with Dr. Gaynell Face of PCCM, they do not feel like at this time patient requires ICU.  [EH]    Clinical Course User Index [EH] Norman Clay   MDM Rules/Calculators/A&P                         Patient is a 55 year old woman who presents today for evaluation of altered mental status since yesterday that has reportedly been worsening.  On initial exam she has decreased GCS however is protecting her airway.  Her mental status improved with IV fluids.  EKG was ordered on arrival.  She is called a code sepsis in the field which I confirmed on arrival.  She did not not receive 30 mL/kg of LR given that while in my care she was not hypotensive and her lactic was not over 4. CT head is obtained without acute abnormalities. CBC shows significant leukocytosis at 21 with neutrophils of 18.6.  CMP shows glucose is elevated at 145.  She has a AKI with her creatinine at 2.5 and GFR of 22, 11 months ago her creatinine was 1.16.  Her lactic acid is slightly elevated at 2.5.  CT chest, abdomen, and pelvis is obtained due to altered mental status and concern for sepsis as she was febrile, hypotensive, and tachycardic prior to arrival.  CT chest shows concern for a moderate pericardial effusion.  CT abdomen shows concern for bilateral ureteral stones, question full obstruction.  She has not produced urine   I spoke with Dr. Mena Goes of urology who will see the patient in consult, requested that she be  kept n.p.o.  I did speak with Dr. Gaynell Face PCCM who feels like at this point  patient can be safely managed by medicine.  I spoke with hosptialist who will see the patient for admission.  I did speak with the patient's healthcare power of attorney as listed on her documents from the SNF. The actual healthcare power of attorney document is not here as far as I am aware.  She is updated on plan and results.  The patient appears reasonably stabilized for admission considering the current resources, flow, and capabilities available in the ED at this time, and I doubt any other [facility] requiring further screening and/or treatment in the ED prior to admission assuming timely admission and bed placement.  Note: Portions of this report may have been transcribed using voice recognition software. Every effort was made to ensure accuracy; however, inadvertent computerized transcription errors may be present    Final Clinical Impression(s) / ED Diagnoses Final diagnoses:  Altered mental status, unspecified altered mental status type  Bilateral ureteral calculi  Sepsis with encephalopathy without septic shock, due to unspecified organism [facility])  AKI (acute kidney injury) [facility])    Rx / DC Orders ED Discharge Orders    None       Cristina Gong, PA-C 05/11/20 0024    Margarita Grizzle, MD 05/11/20 1445

## 2020-05-10 NOTE — Consult Note (Addendum)
Consult: bilateral ureteral stones, hydronephrosis, sepsis Requested by: Dr. Margarita Grizzle   History of Present Illness: 55 yo female presented from facility with worsening mental status since 05/09/2020. On paper sent with her from the facility it appears that she had a 1 view KUB performed due to vomiting dark material which showed concern for a possible early ileus. EMS reports that initially patient's blood pressure was in the 70s systolic. CT A/P 05/10/2020 revealed an 8 mm right proximal stone and a 2 mm left distal stone with R>L hydroureteronephrosis. She also had bilateral renal stones of about 9 mm. Cr 2.5 with prior Cr 1.16. Code sepsis initiated.   Past medical history of COPD, CKD, CHF, CVA, hypertension, DM 2, right-sided chronic deficits. Her POA is her sister Jeanine Luz at [phone].   Level 5 caveat altered mental status.   Past Medical History:  Diagnosis Date  . CHF (congestive heart failure) (HCC)   . CKD (chronic kidney disease)   . COPD (chronic obstructive pulmonary disease) (HCC)   . CVA (cerebral vascular accident) (HCC)   . Hypertension   . Type 2 diabetes mellitus (HCC)    History reviewed. No pertinent surgical history.  Home Medications:  (Not in a hospital admission)  Allergies:  Allergies  Allergen Reactions  . Latex Other (See Comments)    On MAR    History reviewed. No pertinent family history. Social History:  reports that she has quit smoking. She has quit using smokeless tobacco. She reports previous alcohol use. She reports previous drug use.  ROS: A complete review of systems was performed.  All systems are negative except for pertinent findings as noted. Review of Systems  Unable to perform ROS: Mental acuity     Physical Exam:  Vital signs in last 24 hours: Temp:  [99.3 F (37.4 C)-100.1 F (37.8 C)] 100.1 F (37.8 C) (04/16 2208) Pulse Rate:  [115-124] 115 (04/16 2000) Resp:  [20] 20 (04/16 2000) BP: (101-116)/(79-86) 116/86  (04/16 1945) SpO2:  [94 %-97 %] 97 % (04/16 2000) Weight:  [106.6 kg] 106.6 kg (04/16 2207) General:  Her eyes are open but she does not respond. Tepid.  HEENT: Normocephalic, atraumatic Cardiovascular: Tachycardic Lungs: Tachypneic but no distress, reg effort.  Abdomen: Soft, nontender, nondistended, no abdominal masses Extremities: + edema Neurologic: Grossly intact  Laboratory Data:  Results for orders placed or performed during the hospital encounter of 05/10/20 (from the past 24 hour(s))  Comprehensive metabolic panel     Status: Abnormal   Collection Time: 05/10/20  7:18 PM  Result Value Ref Range   Sodium 136 135 - 145 mmol/L   Potassium 3.7 3.5 - 5.1 mmol/L   Chloride 105 98 - 111 mmol/L   CO2 20 (L) 22 - 32 mmol/L   Glucose, Bld 145 (H) 70 - 99 mg/dL   BUN 32 (H) 6 - 20 mg/dL   Creatinine, Ser 4.17 (H) 0.44 - 1.00 mg/dL   Calcium 8.5 (L) 8.9 - 10.3 mg/dL   Total Protein 6.4 (L) 6.5 - 8.1 g/dL   Albumin 3.1 (L) 3.5 - 5.0 g/dL   AST 29 15 - 41 U/L   ALT 37 0 - 44 U/L   Alkaline Phosphatase 96 38 - 126 U/L   Total Bilirubin 0.5 0.3 - 1.2 mg/dL   GFR, Estimated 22 (L) >60 mL/min   Anion gap 11 5 - 15  Lactic acid, plasma     Status: Abnormal   Collection Time: 05/10/20  7:18 PM  Result Value Ref Range   Lactic Acid, Venous 2.5 (HH) 0.5 - 1.9 mmol/L  CBC with Differential     Status: Abnormal   Collection Time: 05/10/20  7:18 PM  Result Value Ref Range   WBC 21.0 (H) 4.0 - 10.5 K/uL   RBC 4.89 3.87 - 5.11 MIL/uL   Hemoglobin 15.0 12.0 - 15.0 g/dL   HCT 12.8 78.6 - 76.7 %   MCV 92.2 80.0 - 100.0 fL   MCH 30.7 26.0 - 34.0 pg   MCHC 33.3 30.0 - 36.0 g/dL   RDW 20.9 47.0 - 96.2 %   Platelets 237 150 - 400 K/uL   nRBC 0.1 0.0 - 0.2 %   Neutrophils Relative % 88 %   Neutro Abs 18.6 (H) 1.7 - 7.7 K/uL   Lymphocytes Relative 4 %   Lymphs Abs 0.8 0.7 - 4.0 K/uL   Monocytes Relative 6 %   Monocytes Absolute 1.2 (H) 0.1 - 1.0 K/uL   Eosinophils Relative 0 %    Eosinophils Absolute 0.0 0.0 - 0.5 K/uL   Basophils Relative 0 %   Basophils Absolute 0.1 0.0 - 0.1 K/uL   Immature Granulocytes 2 %   Abs Immature Granulocytes 0.36 (H) 0.00 - 0.07 K/uL  Protime-INR     Status: Abnormal   Collection Time: 05/10/20  7:18 PM  Result Value Ref Range   Prothrombin Time 16.9 (H) 11.4 - 15.2 seconds   INR 1.4 (H) 0.8 - 1.2  CBG monitoring, ED     Status: Abnormal   Collection Time: 05/10/20  7:29 PM  Result Value Ref Range   Glucose-Capillary 135 (H) 70 - 99 mg/dL  Resp Panel by RT-PCR (Flu A&B, Covid) Nasopharyngeal Swab     Status: None   Collection Time: 05/10/20  7:45 PM   Specimen: Nasopharyngeal Swab; Nasopharyngeal(NP) swabs in vial transport medium  Result Value Ref Range   SARS Coronavirus 2 by RT PCR NEGATIVE NEGATIVE   Influenza A by PCR NEGATIVE NEGATIVE   Influenza B by PCR NEGATIVE NEGATIVE  Urinalysis, Routine w reflex microscopic     Status: Abnormal   Collection Time: 05/10/20 11:41 PM  Result Value Ref Range   Color, Urine YELLOW YELLOW   APPearance CLOUDY (A) CLEAR   Specific Gravity, Urine 1.020 1.005 - 1.030   pH 7.5 5.0 - 8.0   Glucose, UA NEGATIVE NEGATIVE mg/dL   Hgb urine dipstick LARGE (A) NEGATIVE   Bilirubin Urine NEGATIVE NEGATIVE   Ketones, ur NEGATIVE NEGATIVE mg/dL   Protein, ur 836 (A) NEGATIVE mg/dL   Nitrite NEGATIVE NEGATIVE   Leukocytes,Ua SMALL (A) NEGATIVE  Urinalysis, Microscopic (reflex)     Status: Abnormal   Collection Time: 05/10/20 11:41 PM  Result Value Ref Range   RBC / HPF 21-50 0 - 5 RBC/hpf   WBC, UA 21-50 0 - 5 WBC/hpf   Bacteria, UA MANY (A) NONE SEEN   Squamous Epithelial / LPF 0-5 0 - 5   Recent Results (from the past 240 hour(s))  Resp Panel by RT-PCR (Flu A&B, Covid) Nasopharyngeal Swab     Status: None   Collection Time: 05/10/20  7:45 PM   Specimen: Nasopharyngeal Swab; Nasopharyngeal(NP) swabs in vial transport medium  Result Value Ref Range Status   SARS Coronavirus 2 by RT PCR  NEGATIVE NEGATIVE Final    Comment: (NOTE) SARS-CoV-2 target nucleic acids are NOT DETECTED.  The SARS-CoV-2 RNA is generally detectable in upper respiratory specimens during the acute phase of  infection. The lowest concentration of SARS-CoV-2 viral copies this assay can detect is 138 copies/mL. A negative result does not preclude SARS-Cov-2 infection and should not be used as the sole basis for treatment or other patient management decisions. A negative result may occur with  improper specimen collection/handling, submission of specimen other than nasopharyngeal swab, presence of viral mutation(s) within the areas targeted by this assay, and inadequate number of viral copies(<138 copies/mL). A negative result must be combined with clinical observations, patient history, and epidemiological information. The expected result is Negative.  Fact Sheet for Patients:  BloggerCourse.com  Fact Sheet for Healthcare Providers:  SeriousBroker.it  This test is no t yet approved or cleared by the [place] FDA and  has been authorized for detection and/or diagnosis of SARS-CoV-2 by FDA under an Emergency Use Authorization (EUA). This EUA will remain  in effect (meaning this test can be used) for the duration of the COVID-19 declaration under Section 564(b)(1) of the Act, 21 U.S.C.section 360bbb-3(b)(1), unless the authorization is terminated  or revoked sooner.       Influenza A by PCR NEGATIVE NEGATIVE Final   Influenza B by PCR NEGATIVE NEGATIVE Final    Comment: (NOTE) The Xpert Xpress SARS-CoV-2/FLU/RSV plus assay is intended as an aid in the diagnosis of influenza from Nasopharyngeal swab specimens and should not be used as a sole basis for treatment. Nasal washings and aspirates are unacceptable for Xpert Xpress SARS-CoV-2/FLU/RSV testing.  Fact Sheet for Patients: BloggerCourse.com  Fact Sheet for  Healthcare Providers: SeriousBroker.it  This test is not yet approved or cleared by the [place] FDA and has been authorized for detection and/or diagnosis of SARS-CoV-2 by FDA under an Emergency Use Authorization (EUA). This EUA will remain in effect (meaning this test can be used) for the duration of the COVID-19 declaration under Section 564(b)(1) of the Act, 21 U.S.C. section 360bbb-3(b)(1), unless the authorization is terminated or revoked.  Performed at [facility], [address], [place], [place] [address]    Creatinine: Recent Labs    05/10/20 1918  CREATININE 2.50*    Impression/Assessment:  Bilateral ureteral stones, bilateral renal stones, sepsis -   Plan:  Urgency bilateral ureteral stents and foley placement - I spoke to her sister Steward Drone [facility]) and we discussed the nature, potential benefits, risks and alternatives to cystoscopy, bilateral RGP, bilateral ureteral stent placement, including side effects of the proposed treatment, the likelihood of the patient achieving the goals of the procedure, and any potential problems that might occur during the procedure or recuperation. We discussed the rationale for stents and a staged approach to URS. All questions answered. She elects for her sister to proceed.   Jerilee Field 05/11/2020, 1:17 AM

## 2020-05-10 NOTE — ED Notes (Signed)
Jeanine Luz POA [phone] would like an update when possible

## 2020-05-10 NOTE — ED Notes (Signed)
Idabelle Mcpeters called [employer] her number is [phone]

## 2020-05-10 NOTE — Progress Notes (Signed)
Pharmacy Antibiotic Note  Julie Donovan is a 55 y.o. female admitted on 05/10/2020 with sepsis.  Pharmacy has been consulted for vancomycin and cefepime dosing.  Plan: Cefepime 2g IV every 24 hours Vancomycin 2000 mg IV x 1, then variable dosing due to unstable renal function Monitor renal function, Cx and clinical progression to narrow Vancomycin levels as needed     Temp (24hrs), Avg:99.3 F (37.4 C), Min:99.3 F (37.4 C), Max:99.3 F (37.4 C)  Recent Labs  Lab 05/10/20 1918  WBC 21.0*  CREATININE 2.50*    CrCl cannot be calculated (Unknown ideal weight.).    Allergies  Allergen Reactions  . Latex Other (See Comments)    On MAR    Daylene Posey, PharmD Clinical Pharmacist ED Pharmacist Phone # [phone] 05/10/2020 8:41 PM

## 2020-05-10 NOTE — ED Triage Notes (Signed)
From facility where staff unfamiliar with pt. States last night and into today she has become disoriented. baseliine oriented to place and person pt is diaphoretic and non verbal at this time

## 2020-05-10 NOTE — H&P (View-Only) (Signed)
Consult: bilateral ureteral stones, hydronephrosis, sepsis Requested by: Dr. Margarita Grizzle   History of Present Illness: 55 yo female presented from facility with worsening mental status since 05/09/2020. On paper sent with her from the facility it appears that she had a 1 view KUB performed due to vomiting dark material which showed concern for a possible early ileus. EMS reports that initially patient's blood pressure was in the 70s systolic. CT A/P 05/10/2020 revealed an 8 mm right proximal stone and a 2 mm left distal stone with R>L hydroureteronephrosis. She also had bilateral renal stones of about 9 mm. Cr 2.5 with prior Cr 1.16. Code sepsis initiated.   Past medical history of COPD, CKD, CHF, CVA, hypertension, DM 2, right-sided chronic deficits. Her POA is her sister Jeanine Luz at [phone].   Level 5 caveat altered mental status.   Past Medical History:  Diagnosis Date  . CHF (congestive heart failure) (HCC)   . CKD (chronic kidney disease)   . COPD (chronic obstructive pulmonary disease) (HCC)   . CVA (cerebral vascular accident) (HCC)   . Hypertension   . Type 2 diabetes mellitus (HCC)    History reviewed. No pertinent surgical history.  Home Medications:  (Not in a hospital admission)  Allergies:  Allergies  Allergen Reactions  . Latex Other (See Comments)    On MAR    History reviewed. No pertinent family history. Social History:  reports that she has quit smoking. She has quit using smokeless tobacco. She reports previous alcohol use. She reports previous drug use.  ROS: A complete review of systems was performed.  All systems are negative except for pertinent findings as noted. Review of Systems  Unable to perform ROS: Mental acuity     Physical Exam:  Vital signs in last 24 hours: Temp:  [99.3 F (37.4 C)-100.1 F (37.8 C)] 100.1 F (37.8 C) (04/16 2208) Pulse Rate:  [115-124] 115 (04/16 2000) Resp:  [20] 20 (04/16 2000) BP: (101-116)/(79-86) 116/86  (04/16 1945) SpO2:  [94 %-97 %] 97 % (04/16 2000) Weight:  [106.6 kg] 106.6 kg (04/16 2207) General:  Her eyes are open but she does not respond. Tepid.  HEENT: Normocephalic, atraumatic Cardiovascular: Tachycardic Lungs: Tachypneic but no distress, reg effort.  Abdomen: Soft, nontender, nondistended, no abdominal masses Extremities: + edema Neurologic: Grossly intact  Laboratory Data:  Results for orders placed or performed during the hospital encounter of 05/10/20 (from the past 24 hour(s))  Comprehensive metabolic panel     Status: Abnormal   Collection Time: 05/10/20  7:18 PM  Result Value Ref Range   Sodium 136 135 - 145 mmol/L   Potassium 3.7 3.5 - 5.1 mmol/L   Chloride 105 98 - 111 mmol/L   CO2 20 (L) 22 - 32 mmol/L   Glucose, Bld 145 (H) 70 - 99 mg/dL   BUN 32 (H) 6 - 20 mg/dL   Creatinine, Ser 4.17 (H) 0.44 - 1.00 mg/dL   Calcium 8.5 (L) 8.9 - 10.3 mg/dL   Total Protein 6.4 (L) 6.5 - 8.1 g/dL   Albumin 3.1 (L) 3.5 - 5.0 g/dL   AST 29 15 - 41 U/L   ALT 37 0 - 44 U/L   Alkaline Phosphatase 96 38 - 126 U/L   Total Bilirubin 0.5 0.3 - 1.2 mg/dL   GFR, Estimated 22 (L) >60 mL/min   Anion gap 11 5 - 15  Lactic acid, plasma     Status: Abnormal   Collection Time: 05/10/20  7:18 PM  Result Value Ref Range   Lactic Acid, Venous 2.5 (HH) 0.5 - 1.9 mmol/L  CBC with Differential     Status: Abnormal   Collection Time: 05/10/20  7:18 PM  Result Value Ref Range   WBC 21.0 (H) 4.0 - 10.5 K/uL   RBC 4.89 3.87 - 5.11 MIL/uL   Hemoglobin 15.0 12.0 - 15.0 g/dL   HCT 45.1 36.0 - 46.0 %   MCV 92.2 80.0 - 100.0 fL   MCH 30.7 26.0 - 34.0 pg   MCHC 33.3 30.0 - 36.0 g/dL   RDW 12.8 11.5 - 15.5 %   Platelets 237 150 - 400 K/uL   nRBC 0.1 0.0 - 0.2 %   Neutrophils Relative % 88 %   Neutro Abs 18.6 (H) 1.7 - 7.7 K/uL   Lymphocytes Relative 4 %   Lymphs Abs 0.8 0.7 - 4.0 K/uL   Monocytes Relative 6 %   Monocytes Absolute 1.2 (H) 0.1 - 1.0 K/uL   Eosinophils Relative 0 %    Eosinophils Absolute 0.0 0.0 - 0.5 K/uL   Basophils Relative 0 %   Basophils Absolute 0.1 0.0 - 0.1 K/uL   Immature Granulocytes 2 %   Abs Immature Granulocytes 0.36 (H) 0.00 - 0.07 K/uL  Protime-INR     Status: Abnormal   Collection Time: 05/10/20  7:18 PM  Result Value Ref Range   Prothrombin Time 16.9 (H) 11.4 - 15.2 seconds   INR 1.4 (H) 0.8 - 1.2  CBG monitoring, ED     Status: Abnormal   Collection Time: 05/10/20  7:29 PM  Result Value Ref Range   Glucose-Capillary 135 (H) 70 - 99 mg/dL  Resp Panel by RT-PCR (Flu A&B, Covid) Nasopharyngeal Swab     Status: None   Collection Time: 05/10/20  7:45 PM   Specimen: Nasopharyngeal Swab; Nasopharyngeal(NP) swabs in vial transport medium  Result Value Ref Range   SARS Coronavirus 2 by RT PCR NEGATIVE NEGATIVE   Influenza A by PCR NEGATIVE NEGATIVE   Influenza B by PCR NEGATIVE NEGATIVE  Urinalysis, Routine w reflex microscopic     Status: Abnormal   Collection Time: 05/10/20 11:41 PM  Result Value Ref Range   Color, Urine YELLOW YELLOW   APPearance CLOUDY (A) CLEAR   Specific Gravity, Urine 1.020 1.005 - 1.030   pH 7.5 5.0 - 8.0   Glucose, UA NEGATIVE NEGATIVE mg/dL   Hgb urine dipstick LARGE (A) NEGATIVE   Bilirubin Urine NEGATIVE NEGATIVE   Ketones, ur NEGATIVE NEGATIVE mg/dL   Protein, ur 100 (A) NEGATIVE mg/dL   Nitrite NEGATIVE NEGATIVE   Leukocytes,Ua SMALL (A) NEGATIVE  Urinalysis, Microscopic (reflex)     Status: Abnormal   Collection Time: 05/10/20 11:41 PM  Result Value Ref Range   RBC / HPF 21-50 0 - 5 RBC/hpf   WBC, UA 21-50 0 - 5 WBC/hpf   Bacteria, UA MANY (A) NONE SEEN   Squamous Epithelial / LPF 0-5 0 - 5   Recent Results (from the past 240 hour(s))  Resp Panel by RT-PCR (Flu A&B, Covid) Nasopharyngeal Swab     Status: None   Collection Time: 05/10/20  7:45 PM   Specimen: Nasopharyngeal Swab; Nasopharyngeal(NP) swabs in vial transport medium  Result Value Ref Range Status   SARS Coronavirus 2 by RT PCR  NEGATIVE NEGATIVE Final    Comment: (NOTE) SARS-CoV-2 target nucleic acids are NOT DETECTED.  The SARS-CoV-2 RNA is generally detectable in upper respiratory specimens during the acute phase of   infection. The lowest concentration of SARS-CoV-2 viral copies this assay can detect is 138 copies/mL. A negative result does not preclude SARS-Cov-2 infection and should not be used as the sole basis for treatment or other patient management decisions. A negative result may occur with  improper specimen collection/handling, submission of specimen other than nasopharyngeal swab, presence of viral mutation(s) within the areas targeted by this assay, and inadequate number of viral copies(<138 copies/mL). A negative result must be combined with clinical observations, patient history, and epidemiological information. The expected result is Negative.  Fact Sheet for Patients:  BloggerCourse.com  Fact Sheet for Healthcare Providers:  SeriousBroker.it  This test is no t yet approved or cleared by the [place] FDA and  has been authorized for detection and/or diagnosis of SARS-CoV-2 by FDA under an Emergency Use Authorization (EUA). This EUA will remain  in effect (meaning this test can be used) for the duration of the COVID-19 declaration under Section 564(b)(1) of the Act, 21 U.S.C.section 360bbb-3(b)(1), unless the authorization is terminated  or revoked sooner.       Influenza A by PCR NEGATIVE NEGATIVE Final   Influenza B by PCR NEGATIVE NEGATIVE Final    Comment: (NOTE) The Xpert Xpress SARS-CoV-2/FLU/RSV plus assay is intended as an aid in the diagnosis of influenza from Nasopharyngeal swab specimens and should not be used as a sole basis for treatment. Nasal washings and aspirates are unacceptable for Xpert Xpress SARS-CoV-2/FLU/RSV testing.  Fact Sheet for Patients: BloggerCourse.com  Fact Sheet for  Healthcare Providers: SeriousBroker.it  This test is not yet approved or cleared by the [place] FDA and has been authorized for detection and/or diagnosis of SARS-CoV-2 by FDA under an Emergency Use Authorization (EUA). This EUA will remain in effect (meaning this test can be used) for the duration of the COVID-19 declaration under Section 564(b)(1) of the Act, 21 U.S.C. section 360bbb-3(b)(1), unless the authorization is terminated or revoked.  Performed at [facility], [address], [place], [place] [address]    Creatinine: Recent Labs    05/10/20 1918  CREATININE 2.50*    Impression/Assessment:  Bilateral ureteral stones, bilateral renal stones, sepsis -   Plan:  Urgency bilateral ureteral stents and foley placement - I spoke to her sister Steward Drone [facility]) and we discussed the nature, potential benefits, risks and alternatives to cystoscopy, bilateral RGP, bilateral ureteral stent placement, including side effects of the proposed treatment, the likelihood of the patient achieving the goals of the procedure, and any potential problems that might occur during the procedure or recuperation. We discussed the rationale for stents and a staged approach to URS. All questions answered. She elects for her sister to proceed.   Jerilee Field 05/11/2020, 1:17 AM

## 2020-05-11 ENCOUNTER — Inpatient Hospital Stay (HOSPITAL_COMMUNITY): Payer: Medicaid Other

## 2020-05-11 ENCOUNTER — Encounter (HOSPITAL_COMMUNITY): Payer: Self-pay | Admitting: Internal Medicine

## 2020-05-11 ENCOUNTER — Inpatient Hospital Stay (HOSPITAL_COMMUNITY): Payer: Medicaid Other | Admitting: Certified Registered"

## 2020-05-11 ENCOUNTER — Encounter (HOSPITAL_COMMUNITY)
Admission: EM | Disposition: A | Discharge status: Skilled Nursing Facility | Payer: Self-pay | Source: Skilled Nursing Facility | Attending: Internal Medicine

## 2020-05-11 DIAGNOSIS — I69320 Aphasia following cerebral infarction: Secondary | ICD-10-CM | POA: Diagnosis not present

## 2020-05-11 DIAGNOSIS — Z960 Presence of urogenital implants: Secondary | ICD-10-CM | POA: Diagnosis not present

## 2020-05-11 DIAGNOSIS — E46 Unspecified protein-calorie malnutrition: Secondary | ICD-10-CM | POA: Diagnosis present

## 2020-05-11 DIAGNOSIS — B964 Proteus (mirabilis) (morganii) as the cause of diseases classified elsewhere: Secondary | ICD-10-CM | POA: Diagnosis not present

## 2020-05-11 DIAGNOSIS — D696 Thrombocytopenia, unspecified: Secondary | ICD-10-CM | POA: Diagnosis present

## 2020-05-11 DIAGNOSIS — N136 Pyonephrosis: Secondary | ICD-10-CM | POA: Diagnosis present

## 2020-05-11 DIAGNOSIS — N201 Calculus of ureter: Secondary | ICD-10-CM | POA: Diagnosis not present

## 2020-05-11 DIAGNOSIS — G9349 Other encephalopathy: Secondary | ICD-10-CM | POA: Diagnosis not present

## 2020-05-11 DIAGNOSIS — E785 Hyperlipidemia, unspecified: Secondary | ICD-10-CM | POA: Diagnosis present

## 2020-05-11 DIAGNOSIS — N179 Acute kidney failure, unspecified: Secondary | ICD-10-CM

## 2020-05-11 DIAGNOSIS — E8809 Other disorders of plasma-protein metabolism, not elsewhere classified: Secondary | ICD-10-CM | POA: Diagnosis not present

## 2020-05-11 DIAGNOSIS — R7881 Bacteremia: Secondary | ICD-10-CM | POA: Diagnosis not present

## 2020-05-11 DIAGNOSIS — E872 Acidosis, unspecified: Secondary | ICD-10-CM

## 2020-05-11 DIAGNOSIS — N200 Calculus of kidney: Secondary | ICD-10-CM | POA: Insufficient documentation

## 2020-05-11 DIAGNOSIS — A419 Sepsis, unspecified organism: Secondary | ICD-10-CM | POA: Diagnosis present

## 2020-05-11 DIAGNOSIS — I959 Hypotension, unspecified: Secondary | ICD-10-CM | POA: Diagnosis not present

## 2020-05-11 DIAGNOSIS — R609 Edema, unspecified: Secondary | ICD-10-CM | POA: Diagnosis not present

## 2020-05-11 DIAGNOSIS — Z6841 Body Mass Index (BMI) 40.0 and over, adult: Secondary | ICD-10-CM | POA: Diagnosis not present

## 2020-05-11 DIAGNOSIS — A4159 Other Gram-negative sepsis: Secondary | ICD-10-CM | POA: Diagnosis present

## 2020-05-11 DIAGNOSIS — I69351 Hemiplegia and hemiparesis following cerebral infarction affecting right dominant side: Secondary | ICD-10-CM | POA: Diagnosis not present

## 2020-05-11 DIAGNOSIS — E669 Obesity, unspecified: Secondary | ICD-10-CM | POA: Diagnosis not present

## 2020-05-11 DIAGNOSIS — Z739 Problem related to life management difficulty, unspecified: Secondary | ICD-10-CM | POA: Diagnosis not present

## 2020-05-11 DIAGNOSIS — R6520 Severe sepsis without septic shock: Secondary | ICD-10-CM | POA: Diagnosis not present

## 2020-05-11 DIAGNOSIS — R739 Hyperglycemia, unspecified: Secondary | ICD-10-CM

## 2020-05-11 DIAGNOSIS — R6521 Severe sepsis with septic shock: Secondary | ICD-10-CM | POA: Diagnosis present

## 2020-05-11 DIAGNOSIS — E1165 Type 2 diabetes mellitus with hyperglycemia: Secondary | ICD-10-CM | POA: Diagnosis present

## 2020-05-11 DIAGNOSIS — I130 Hypertensive heart and chronic kidney disease with heart failure and stage 1 through stage 4 chronic kidney disease, or unspecified chronic kidney disease: Secondary | ICD-10-CM | POA: Diagnosis present

## 2020-05-11 DIAGNOSIS — J9601 Acute respiratory failure with hypoxia: Secondary | ICD-10-CM | POA: Diagnosis not present

## 2020-05-11 DIAGNOSIS — N132 Hydronephrosis with renal and ureteral calculous obstruction: Secondary | ICD-10-CM | POA: Diagnosis present

## 2020-05-11 DIAGNOSIS — A498 Other bacterial infections of unspecified site: Secondary | ICD-10-CM | POA: Diagnosis not present

## 2020-05-11 DIAGNOSIS — I313 Pericardial effusion (noninflammatory): Secondary | ICD-10-CM | POA: Diagnosis present

## 2020-05-11 DIAGNOSIS — D72829 Elevated white blood cell count, unspecified: Secondary | ICD-10-CM | POA: Diagnosis not present

## 2020-05-11 DIAGNOSIS — G9341 Metabolic encephalopathy: Secondary | ICD-10-CM | POA: Diagnosis not present

## 2020-05-11 DIAGNOSIS — J9811 Atelectasis: Secondary | ICD-10-CM | POA: Diagnosis present

## 2020-05-11 DIAGNOSIS — I5032 Chronic diastolic (congestive) heart failure: Secondary | ICD-10-CM | POA: Diagnosis present

## 2020-05-11 DIAGNOSIS — Z20822 Contact with and (suspected) exposure to covid-19: Secondary | ICD-10-CM | POA: Diagnosis present

## 2020-05-11 DIAGNOSIS — E118 Type 2 diabetes mellitus with unspecified complications: Secondary | ICD-10-CM | POA: Diagnosis not present

## 2020-05-11 DIAGNOSIS — R4182 Altered mental status, unspecified: Secondary | ICD-10-CM

## 2020-05-11 DIAGNOSIS — E1122 Type 2 diabetes mellitus with diabetic chronic kidney disease: Secondary | ICD-10-CM | POA: Diagnosis present

## 2020-05-11 DIAGNOSIS — N189 Chronic kidney disease, unspecified: Secondary | ICD-10-CM | POA: Diagnosis not present

## 2020-05-11 DIAGNOSIS — I129 Hypertensive chronic kidney disease with stage 1 through stage 4 chronic kidney disease, or unspecified chronic kidney disease: Secondary | ICD-10-CM | POA: Diagnosis not present

## 2020-05-11 DIAGNOSIS — G9340 Encephalopathy, unspecified: Secondary | ICD-10-CM | POA: Diagnosis not present

## 2020-05-11 DIAGNOSIS — D6489 Other specified anemias: Secondary | ICD-10-CM | POA: Diagnosis present

## 2020-05-11 HISTORY — PX: CYSTOSCOPY W/ URETERAL STENT PLACEMENT: SHX1429

## 2020-05-11 LAB — CBC
HCT: 36.8 % (ref 36.0–46.0)
HCT: 36 % (ref 36.0–46.0)
Hemoglobin: 11.8 g/dL — ABNORMAL LOW (ref 12.0–15.0)
Hemoglobin: 11.4 g/dL — ABNORMAL LOW (ref 12.0–15.0)
MCH: 30.3 pg (ref 26.0–34.0)
MCH: 31.1 pg (ref 26.0–34.0)
MCHC: 32.1 g/dL (ref 30.0–36.0)
MCHC: 31.7 g/dL (ref 30.0–36.0)
MCV: 94.6 fL (ref 80.0–100.0)
MCV: 98.4 fL (ref 80.0–100.0)
Platelets: 127 10*3/uL — ABNORMAL LOW (ref 150–400)
Platelets: 110 10*3/uL — ABNORMAL LOW (ref 150–400)
RBC: 3.89 MIL/uL (ref 3.87–5.11)
RBC: 3.66 MIL/uL — ABNORMAL LOW (ref 3.87–5.11)
RDW: 13.2 % (ref 11.5–15.5)
RDW: 13.3 % (ref 11.5–15.5)
WBC: 17.6 10*3/uL — ABNORMAL HIGH (ref 4.0–10.5)
WBC: 22.5 10*3/uL — ABNORMAL HIGH (ref 4.0–10.5)
nRBC: 0 % (ref 0.0–0.2)
nRBC: 0 % (ref 0.0–0.2)

## 2020-05-11 LAB — BLOOD CULTURE ID PANEL (REFLEXED) - BCID2

## 2020-05-11 LAB — LACTIC ACID, PLASMA
Lactic Acid, Venous: 4.5 mmol/L (ref 0.5–1.9)
Lactic Acid, Venous: 4.7 mmol/L (ref 0.5–1.9)
Lactic Acid, Venous: 2.6 mmol/L (ref 0.5–1.9)

## 2020-05-11 LAB — POCT I-STAT 7, (LYTES, BLD GAS, ICA,H+H)
Acid-base deficit: 8 mmol/L — ABNORMAL HIGH (ref 0.0–2.0)
Acid-base deficit: 5 mmol/L — ABNORMAL HIGH (ref 0.0–2.0)
Bicarbonate: 18.3 mmol/L — ABNORMAL LOW (ref 20.0–28.0)
Bicarbonate: 17.4 mmol/L — ABNORMAL LOW (ref 20.0–28.0)
Calcium, Ion: 1.12 mmol/L — ABNORMAL LOW (ref 1.15–1.40)
Calcium, Ion: 1.11 mmol/L — ABNORMAL LOW (ref 1.15–1.40)
HCT: 34 % — ABNORMAL LOW (ref 36.0–46.0)
HCT: 33 % — ABNORMAL LOW (ref 36.0–46.0)
Hemoglobin: 11.6 g/dL — ABNORMAL LOW (ref 12.0–15.0)
Hemoglobin: 11.2 g/dL — ABNORMAL LOW (ref 12.0–15.0)
O2 Saturation: 100 %
O2 Saturation: 92 %
Patient temperature: 38.9
Patient temperature: 99.8
Potassium: 3.8 mmol/L (ref 3.5–5.1)
Potassium: 4.1 mmol/L (ref 3.5–5.1)
Sodium: 141 mmol/L (ref 135–145)
Sodium: 139 mmol/L (ref 135–145)
TCO2: 19 mmol/L — ABNORMAL LOW (ref 22–32)
TCO2: 18 mmol/L — ABNORMAL LOW (ref 22–32)
pCO2 arterial: 42.5 mmHg (ref 32.0–48.0)
pCO2 arterial: 26.6 mmHg — ABNORMAL LOW (ref 32.0–48.0)
pH, Arterial: 7.251 — ABNORMAL LOW (ref 7.350–7.450)
pH, Arterial: 7.427 (ref 7.350–7.450)
pO2, Arterial: 373 mmHg — ABNORMAL HIGH (ref 83.0–108.0)
pO2, Arterial: 62 mmHg — ABNORMAL LOW (ref 83.0–108.0)

## 2020-05-11 LAB — URINALYSIS, MICROSCOPIC (REFLEX)

## 2020-05-11 LAB — GLUCOSE, CAPILLARY
Glucose-Capillary: 92 mg/dL (ref 70–99)
Glucose-Capillary: 134 mg/dL — ABNORMAL HIGH (ref 70–99)
Glucose-Capillary: 122 mg/dL — ABNORMAL HIGH (ref 70–99)
Glucose-Capillary: 79 mg/dL (ref 70–99)
Glucose-Capillary: 88 mg/dL (ref 70–99)
Glucose-Capillary: 82 mg/dL (ref 70–99)

## 2020-05-11 LAB — URINALYSIS, ROUTINE W REFLEX MICROSCOPIC
Bilirubin Urine: NEGATIVE
Glucose, UA: NEGATIVE mg/dL
Ketones, ur: NEGATIVE mg/dL
Nitrite: NEGATIVE
Protein, ur: 100 mg/dL — AB
Specific Gravity, Urine: 1.02 (ref 1.005–1.030)
pH: 7.5 (ref 5.0–8.0)

## 2020-05-11 LAB — MAGNESIUM: Magnesium: 1.2 mg/dL — ABNORMAL LOW (ref 1.7–2.4)

## 2020-05-11 LAB — BASIC METABOLIC PANEL
Anion gap: 14 (ref 5–15)
Anion gap: 9 (ref 5–15)
BUN: 37 mg/dL — ABNORMAL HIGH (ref 6–20)
BUN: 32 mg/dL — ABNORMAL HIGH (ref 6–20)
CO2: 17 mmol/L — ABNORMAL LOW (ref 22–32)
CO2: 21 mmol/L — ABNORMAL LOW (ref 22–32)
Calcium: 7.8 mg/dL — ABNORMAL LOW (ref 8.9–10.3)
Calcium: 7.9 mg/dL — ABNORMAL LOW (ref 8.9–10.3)
Chloride: 108 mmol/L (ref 98–111)
Chloride: 111 mmol/L (ref 98–111)
Creatinine, Ser: 2.51 mg/dL — ABNORMAL HIGH (ref 0.44–1.00)
Creatinine, Ser: 1.67 mg/dL — ABNORMAL HIGH (ref 0.44–1.00)
GFR, Estimated: 22 mL/min — ABNORMAL LOW (ref >60)
GFR, Estimated: 36 mL/min — ABNORMAL LOW (ref >60)
Glucose, Bld: 129 mg/dL — ABNORMAL HIGH (ref 70–99)
Glucose, Bld: 114 mg/dL — ABNORMAL HIGH (ref 70–99)
Potassium: 3.1 mmol/L — ABNORMAL LOW (ref 3.5–5.1)
Potassium: 4.3 mmol/L (ref 3.5–5.1)
Sodium: 139 mmol/L (ref 135–145)
Sodium: 141 mmol/L (ref 135–145)

## 2020-05-11 LAB — APTT: aPTT: 34 seconds (ref 24–36)

## 2020-05-11 LAB — HIV ANTIBODY (ROUTINE TESTING W REFLEX): HIV Screen 4th Generation wRfx: NONREACTIVE

## 2020-05-11 LAB — MRSA PCR SCREENING: MRSA by PCR: NEGATIVE

## 2020-05-11 LAB — PROTIME-INR
INR: 1.8 — ABNORMAL HIGH (ref 0.8–1.2)
Prothrombin Time: 20.8 seconds — ABNORMAL HIGH (ref 11.4–15.2)

## 2020-05-11 LAB — PHOSPHORUS: Phosphorus: 3 mg/dL (ref 2.5–4.6)

## 2020-05-11 SURGERY — CYSTOSCOPY, WITH RETROGRADE PYELOGRAM AND URETERAL STENT INSERTION
Anesthesia: General | Laterality: Bilateral

## 2020-05-11 MED ORDER — LACTATED RINGERS IV BOLUS
1000.0000 mL | Freq: Once | INTRAVENOUS | Status: AC
  Administered 2020-05-11: 1000 mL via INTRAVENOUS

## 2020-05-11 MED ORDER — ACETAMINOPHEN 650 MG RE SUPP
650.0000 mg | RECTAL | Status: AC | PRN
  Administered 2020-05-11 – 2020-05-13 (×3): 650 mg via RECTAL
  Filled 2020-05-11 (×3): qty 1

## 2020-05-11 MED ORDER — MAGNESIUM SULFATE 2 GM/50ML IV SOLN
2.0000 g | Freq: Once | INTRAVENOUS | Status: AC
  Administered 2020-05-11: 2 g via INTRAVENOUS
  Filled 2020-05-11: qty 50

## 2020-05-11 MED ORDER — ACETAMINOPHEN 325 MG PO TABS: 650.0000 mg | ORAL_TABLET | Freq: Four times a day (QID) | ORAL | Status: DC | PRN

## 2020-05-11 MED ORDER — ETOMIDATE 2 MG/ML IV SOLN
INTRAVENOUS | Status: DC | PRN
  Administered 2020-05-11: 10 mg via INTRAVENOUS

## 2020-05-11 MED ORDER — CHLORHEXIDINE GLUCONATE CLOTH 2 % EX PADS
6.0000 | MEDICATED_PAD | Freq: Every day | CUTANEOUS | Status: DC
  Administered 2020-05-11 – 2020-05-22 (×12): 6 via TOPICAL

## 2020-05-11 MED ORDER — LACTATED RINGERS IV BOLUS
250.0000 mL | Freq: Once | INTRAVENOUS | Status: AC
  Administered 2020-05-12: 250 mL via INTRAVENOUS

## 2020-05-11 MED ORDER — CALCIUM GLUCONATE-NACL 2-0.675 GM/100ML-% IV SOLN
2.0000 g | Freq: Once | INTRAVENOUS | Status: AC
  Administered 2020-05-12: 2000 mg via INTRAVENOUS
  Filled 2020-05-11: qty 100

## 2020-05-11 MED ORDER — LACTATED RINGERS IV SOLN: INTRAVENOUS | Status: DC | PRN

## 2020-05-11 MED ORDER — NOREPINEPHRINE 4 MG/250ML-% IV SOLN
0.0000 ug/min | INTRAVENOUS | Status: DC
  Administered 2020-05-11: 5 ug/min via INTRAVENOUS
  Filled 2020-05-11 (×2): qty 250

## 2020-05-11 MED ORDER — PHENYLEPHRINE HCL-NACL 10-0.9 MG/250ML-% IV SOLN
INTRAVENOUS | Status: DC | PRN
  Administered 2020-05-11: 50 ug/min via INTRAVENOUS

## 2020-05-11 MED ORDER — NOREPINEPHRINE 4 MG/250ML-% IV SOLN
INTRAVENOUS | Status: DC | PRN
  Administered 2020-05-11: 3 ug/min via INTRAVENOUS

## 2020-05-11 MED ORDER — SUGAMMADEX SODIUM 200 MG/2ML IV SOLN
INTRAVENOUS | Status: DC | PRN
  Administered 2020-05-11: 400 mg via INTRAVENOUS

## 2020-05-11 MED ORDER — SODIUM CHLORIDE 0.9 % IV SOLN: INTRAVENOUS | Status: DC | PRN

## 2020-05-11 MED ORDER — SODIUM CHLORIDE 0.9 % IV SOLN
250.0000 mg | Freq: Two times a day (BID) | INTRAVENOUS | Status: DC
  Administered 2020-05-11 – 2020-05-12 (×2): 250 mg via INTRAVENOUS
  Filled 2020-05-11 (×3): qty 2.5

## 2020-05-11 MED ORDER — IOHEXOL 300 MG/ML  SOLN
INTRAMUSCULAR | Status: DC | PRN
  Administered 2020-05-11: 10 mL via URETHRAL

## 2020-05-11 MED ORDER — 0.9 % SODIUM CHLORIDE (POUR BTL) OPTIME
TOPICAL | Status: DC | PRN
  Administered 2020-05-11: 1000 mL

## 2020-05-11 MED ORDER — SODIUM CHLORIDE 0.9 % IV SOLN
2.0000 g | Freq: Once | INTRAVENOUS | Status: AC
  Administered 2020-05-11: 2 g via INTRAVENOUS
  Filled 2020-05-11 (×2): qty 2

## 2020-05-11 MED ORDER — FENTANYL CITRATE (PF) 100 MCG/2ML IJ SOLN
INTRAMUSCULAR | Status: DC | PRN
  Administered 2020-05-11: 50 ug via INTRAVENOUS

## 2020-05-11 MED ORDER — ONDANSETRON HCL 4 MG/2ML IJ SOLN
INTRAMUSCULAR | Status: DC | PRN
  Administered 2020-05-11: 4 mg via INTRAVENOUS

## 2020-05-11 MED ORDER — SODIUM CHLORIDE 0.9 % IV SOLN
2.0000 g | Freq: Two times a day (BID) | INTRAVENOUS | Status: DC
  Administered 2020-05-11 – 2020-05-12 (×2): 2 g via INTRAVENOUS
  Filled 2020-05-11 (×2): qty 2

## 2020-05-11 MED ORDER — PHENYLEPHRINE HCL (PRESSORS) 10 MG/ML IV SOLN
INTRAVENOUS | Status: DC | PRN
  Administered 2020-05-11: 80 ug via INTRAVENOUS
  Administered 2020-05-11: 120 ug via INTRAVENOUS

## 2020-05-11 MED ORDER — ALBUMIN HUMAN 5 % IV SOLN: INTRAVENOUS | Status: DC | PRN

## 2020-05-11 MED ORDER — ROCURONIUM BROMIDE 10 MG/ML (PF) SYRINGE
PREFILLED_SYRINGE | INTRAVENOUS | Status: DC | PRN
  Administered 2020-05-11: 50 mg via INTRAVENOUS

## 2020-05-11 MED ORDER — ACETAMINOPHEN 10 MG/ML IV SOLN
INTRAVENOUS | Status: AC
  Filled 2020-05-11: qty 100

## 2020-05-11 MED ORDER — STERILE WATER FOR IRRIGATION IR SOLN
Status: DC | PRN
  Administered 2020-05-11: 3000 mL

## 2020-05-11 MED ORDER — ACETAMINOPHEN 10 MG/ML IV SOLN
INTRAVENOUS | Status: DC | PRN
  Administered 2020-05-11: 1000 mg via INTRAVENOUS

## 2020-05-11 MED ORDER — HYDROMORPHONE HCL 1 MG/ML IJ SOLN
0.2500 mg | INTRAMUSCULAR | Status: DC | PRN
Start: 2020-05-11 — End: 2020-05-11

## 2020-05-11 MED ORDER — SUCCINYLCHOLINE CHLORIDE 200 MG/10ML IV SOSY
PREFILLED_SYRINGE | INTRAVENOUS | Status: DC | PRN
  Administered 2020-05-11: 120 mg via INTRAVENOUS

## 2020-05-11 MED ORDER — NOREPINEPHRINE 4 MG/250ML-% IV SOLN
INTRAVENOUS | Status: AC
  Filled 2020-05-11: qty 250

## 2020-05-11 MED ORDER — POTASSIUM CHLORIDE 10 MEQ/50ML IV SOLN
10.0000 meq | INTRAVENOUS | Status: AC
  Administered 2020-05-11 (×6): 10 meq via INTRAVENOUS
  Filled 2020-05-11 (×6): qty 50

## 2020-05-11 SURGICAL SUPPLY — 24 items
BAG URINE DRAIN 2000ML AR STRL (UROLOGICAL SUPPLIES) ×2 IMPLANT
BAG URO CATCHER STRL LF (MISCELLANEOUS) ×2 IMPLANT
CATH FOLEY 2WAY SLVR  5CC 16FR (CATHETERS)
CATH FOLEY 2WAY SLVR 5CC 16FR (CATHETERS) IMPLANT
CATH INTERMIT  6FR 70CM (CATHETERS) ×2 IMPLANT
CATH URET 5FR 28IN OPEN ENDED (CATHETERS) IMPLANT
GLOVE BIO SURGEON STRL SZ7.5 (GLOVE) ×2 IMPLANT
GOWN STRL REUS W/ TWL LRG LVL3 (GOWN DISPOSABLE) ×1 IMPLANT
GOWN STRL REUS W/ TWL XL LVL3 (GOWN DISPOSABLE) ×2 IMPLANT
GOWN STRL REUS W/TWL LRG LVL3 (GOWN DISPOSABLE) ×1
GOWN STRL REUS W/TWL XL LVL3 (GOWN DISPOSABLE) ×2
GUIDEWIRE ANG ZIPWIRE 038X150 (WIRE) IMPLANT
GUIDEWIRE STR DUAL SENSOR (WIRE) IMPLANT
KIT TURNOVER KIT B (KITS) ×2 IMPLANT
MANIFOLD NEPTUNE II (INSTRUMENTS) ×2 IMPLANT
NS IRRIG 1000ML POUR BTL (IV SOLUTION) ×2 IMPLANT
PACK CYSTO (CUSTOM PROCEDURE TRAY) ×2 IMPLANT
PAD ARMBOARD 7.5X6 YLW CONV (MISCELLANEOUS) ×4 IMPLANT
STENT URET 6FRX24 CONTOUR (STENTS) ×4 IMPLANT
STENT URET 6FRX26 CONTOUR (STENTS) IMPLANT
SYPHON OMNI JUG (MISCELLANEOUS) IMPLANT
TOWEL GREEN STERILE FF (TOWEL DISPOSABLE) ×2 IMPLANT
TUBE CONNECTING 12X1/4 (SUCTIONS) ×2 IMPLANT
UNDERPAD 30X36 HEAVY ABSORB (UNDERPADS AND DIAPERS) ×4 IMPLANT

## 2020-05-11 NOTE — Progress Notes (Signed)
eLink Physician-Brief Progress Note Patient Name: Julie Donovan DOB: 02-Nov-1965 MRN: [id number]   Date of Service  05/11/2020  HPI/Events of Note  Lactic acid 2.6, ionized calcium 1.11, PO2 on last gas was 2 but saturation is 100 % on current venturi mask.   eICU Interventions  Calcium gluconate 2 gm iv bolus x 1, LR 250  ml fluid bolus.        Julie Donovan 05/11/2020, 11:40 PM

## 2020-05-11 NOTE — Progress Notes (Signed)
eLink Physician-Brief Progress Note Patient Name: Julie Donovan DOB: Jan 27, 1965 MRN: [id number]   Date of Service  05/11/2020  HPI/Events of Note  Patient with weak cough and desaturation requiring switch from nasal cannula to Venti-mask with improvement in saturation from 83 % to 96 %. Patient needs oral Tylenol switched to suppository.  eICU Interventions  Tylenol switched from PO to PR.        Thomasene Lot Naelani Lafrance 05/11/2020, 9:25 PM

## 2020-05-11 NOTE — Consult Note (Signed)
NAME:  Julie Donovan, MRN:  [id number], DOB:  1965/06/15, LOS: 0 ADMISSION DATE:  05/10/2020, CONSULTATION DATE:  05/11/20 REFERRING MD:  Dr Thomes Dinning, CHIEF COMPLAINT:  shock  History of Present Illness:  55 yo with h/o COPD, HTN, DM2, cva and residual R sided weakness, reportedly non verbal at baseline who presented with ams found to have obstructing nephrolithiasis with bilateral hydronephrosis. Pt was taken to OR for stent placement and subsequently has become hypotensive for which ccm has been consulted to take to ICU for pressor support.   Pertinent  Medical History  R sided deficits Nonverbal baseline  Significant Hospital Events: Including procedures, antibiotic start and stop dates in addition to other pertinent events   . 4/17: bilateral ureteral stents . 4/16: flagyl/vanc/cefoxitin  Interim History / Subjective:  As above  Objective   Blood pressure 116/86, pulse (!) 115, temperature 100.1 F (37.8 C), temperature source Rectal, resp. rate 20, height 5\' 4"  (1.626 m), weight 106.6 kg, SpO2 97 %.        Intake/Output Summary (Last 24 hours) at 05/11/2020 0246 Last data filed at 05/11/2020 0245 Gross per 24 hour  Intake 2600 ml  Output --  Net 2600 ml   Filed Weights   05/10/20 2207  Weight: 106.6 kg    Examination: General: somnolent, post anesthesia, hirsut HENT: ncat, eomi, mmdry but pink Lungs: cta b Cardiovascular: rrr Abdomen: obese, nt nd  Extremities: no clubbing, cyanosis + edema in all extremities with R UE contraction Neuro: somnolent but does open eyes to verbal and tactile stim. Not following commands. (non verbal at baseline) GU: deferred  Labs/imaging that I havepersonally reviewed  (right click and "Reselect all SmartList Selections" daily)  WBC 21 Cr 2.5  Resolved Hospital Problem list     Assessment & Plan:  Septic shock 2/2 acute obstructing nephrolithiasis:  -s/p stent with urology -titrate vasopressors to map >65 -follow cx -cont  abx.  Bilateral hydronephrosis B/l obstructing nephrolithiasis -as above Arf:  -follow uop -follow indices -suspect will improve now after stenting Lactic acidosis:  -trend  Acute infectious encephalopathy:  -2/2 sepsis -cth negative -follow mental status -reportedly non verbal at baseline   T2dm:  -monitor  H/o cva:  -hronic R deficits.    Best practice (right click and "Reselect all SmartList Selections" daily)  Diet:  NPO Pain/Anxiety/Delirium protocol (if indicated): No VAP protocol (if indicated): Not indicated DVT prophylaxis: SCD GI prophylaxis: N/A Glucose control:  SSI Yes Central venous access:  Yes, and it is still needed Arterial line:  Yes, and it is still needed Foley:  Yes, and it is still needed Mobility:  bed rest  PT consulted: N/A Last date of multidisciplinary goals of care discussion [pending] Code Status:  full code Disposition: ICU  Labs   CBC: Recent Labs  Lab 05/10/20 1918 05/11/20 0149  WBC 21.0*  --   NEUTROABS 18.6*  --   HGB 15.0 11.6*  HCT 45.1 34.0*  MCV 92.2  --   PLT 237  --     Basic Metabolic Panel: Recent Labs  Lab 05/10/20 1918 05/11/20 0149  NA 136 141  K 3.7 3.8  CL 105  --   CO2 20*  --   GLUCOSE 145*  --   BUN 32*  --   CREATININE 2.50*  --   CALCIUM 8.5*  --    GFR: Estimated Creatinine Clearance: 30.3 mL/min (A) (by C-G formula based on SCr of 2.5 mg/dL (H)). Recent Labs  Lab  05/10/20 1918  WBC 21.0*  LATICACIDVEN 2.5*    Liver Function Tests: Recent Labs  Lab 05/10/20 1918  AST 29  ALT 37  ALKPHOS 96  BILITOT 0.5  PROT 6.4*  ALBUMIN 3.1*   No results for input(s): LIPASE, AMYLASE in the last 168 hours. No results for input(s): AMMONIA in the last 168 hours.  ABG    Component Value Date/Time   PHART 7.251 (L) 05/11/2020 0149   PCO2ART 42.5 05/11/2020 0149   PO2ART 373 (H) 05/11/2020 0149   HCO3 18.3 (L) 05/11/2020 0149   TCO2 19 (L) 05/11/2020 0149   ACIDBASEDEF 8.0 (H)  05/11/2020 0149   O2SAT 100.0 05/11/2020 0149     Coagulation Profile: Recent Labs  Lab 05/10/20 1918  INR 1.4*    Cardiac Enzymes: No results for input(s): CKTOTAL, CKMB, CKMBINDEX, TROPONINI in the last 168 hours.  HbA1C: No results found for: HGBA1C  CBG: Recent Labs  Lab 05/10/20 1929  GLUCAP 135*    Review of Systems:   Unobtainable due to non verbal status  Past Medical History:  She,  has a past medical history of CHF (congestive heart failure) (HCC), CKD (chronic kidney disease), COPD (chronic obstructive pulmonary disease) (HCC), CVA (cerebral vascular accident) (HCC), Hypertension, and Type 2 diabetes mellitus (HCC).   Surgical History:  History reviewed. No pertinent surgical history.   Social History:   reports that she has quit smoking. She has quit using smokeless tobacco. She reports previous alcohol use. She reports previous drug use.   Family History:  Her family history is not on file.   Allergies Allergies  Allergen Reactions  . Latex Other (See Comments)    On MAR     Home Medications  Prior to Admission medications   Medication Sig Start Date End Date Taking? Authorizing Provider  acetaminophen (TYLENOL) 500 MG tablet Take 1,000 mg by mouth in the morning and at bedtime.   Yes [provider]  amLODipine (NORVASC) 10 MG tablet Take 10 mg by mouth daily.   Yes [provider]  ARIPiprazole (ABILIFY) 2 MG tablet Take 2 mg by mouth at bedtime.   Yes [provider]  aspirin 325 MG EC tablet Take 325 mg by mouth daily.   Yes [provider]  atorvastatin (LIPITOR) 80 MG tablet Take 80 mg by mouth at bedtime.   Yes [provider]  bisacodyl (DULCOLAX) 10 MG suppository Place 10 mg rectally as needed for moderate constipation.   Yes [provider]  calcium carbonate (TUMS - DOSED IN MG ELEMENTAL CALCIUM) 500 MG chewable tablet Chew 2 tablets by mouth 2 (two) times daily.   Yes [provider]  carvedilol (COREG) 12.5 MG tablet Take 12.5 mg by mouth 2 (two) times daily with a meal.   Yes [provider]  cholecalciferol (VITAMIN D3) 25 MCG (1000 UNIT) tablet Take 1,000 Units by mouth daily.   Yes [provider]  cyclobenzaprine (FLEXERIL) 10 MG tablet Take 10 mg by mouth at bedtime.   Yes [provider]  dicyclomine (BENTYL) 10 MG capsule Take 10 mg by mouth every 8 (eight) hours as needed for spasms.   Yes [provider]  FLUoxetine (PROZAC) 20 MG capsule Take 60 mg by mouth at bedtime.   Yes [provider]  hydrALAZINE (APRESOLINE) 25 MG tablet Take 75 mg by mouth 3 (three) times daily as needed (for Hypertenstion (Hold for systolic less than 140 and diastolic less than 95)).  Yes [provider]  hyoscyamine (LEVSIN) 0.125 MG tablet Take 0.125 mg by mouth every 8 (eight) hours as needed (for IBS).   Yes [provider]  insulin glargine (LANTUS) 100 UNIT/ML injection Inject 41 Units into the skin daily.   Yes [provider]  insulin lispro (HUMALOG) 100 UNIT/ML injection Inject 2-12 Units into the skin 4 (four) times daily -  before meals and at bedtime. If 201-250=2 units 251-300=4 units 301-350=6 units 351-400=8 units 401-450=10 units 451-500=12 units If Glucose Reads High=14 units and recheck   Yes [provider]  levETIRAcetam (KEPPRA) 500 MG tablet Take 500 mg by mouth 2 (two) times daily.   Yes [provider]  linagliptin (TRADJENTA) 5 MG TABS tablet Take 5 mg by mouth daily.   Yes [provider]  lisinopril (ZESTRIL) 5 MG tablet Take 5 mg by mouth daily.   Yes [provider]  polyethylene glycol powder (GLYCOLAX/MIRALAX) 17 GM/SCOOP powder Take 17 g by mouth daily.   Yes [provider]  promethazine (PHENERGAN) 25 MG tablet Take 25 mg by mouth every 6 (six) hours as needed for nausea or vomiting.   Yes [provider]   senna-docusate (SENOKOT-S) 8.6-50 MG tablet Take 2 tablets by mouth 2 (two) times daily.   Yes [provider]  aspirin 81 MG chewable tablet Chew 81 mg by mouth daily. Patient not taking: Reported on 05/10/2020    [provider]  calcium-vitamin D (OSCAL WITH D) 500-200 MG-UNIT tablet Take 1 tablet by mouth daily with breakfast. Patient not taking: Reported on 05/10/2020    [provider]  vitamin B-12 (CYANOCOBALAMIN) 500 MCG tablet Take 500 mcg by mouth daily. Patient not taking: Reported on 05/10/2020    [provider]     Critical care time: The patient is critically ill with multiple organ systems failure and requires high complexity decision making for assessment and support, frequent evaluation and titration of therapies, application of advanced monitoring technologies and extensive interpretation of multiple databases.  Critical care time 35 mins. This represents my time independent of the NPs time taking care of the pt. This is excluding procedures.    Briant Sites DO [facility] 05/11/2020, 2:47 AM See Amion for pager If no response to pager, please call [phone] until 1900 After 1900 please call [facility] [phone]

## 2020-05-11 NOTE — Transfer of Care (Signed)
Immediate Anesthesia Transfer of Care Note  Patient: Julie Donovan  Procedure(s) Performed: CYSTOSCOPY WITH RETROGRADE PYELOGRAM/URETERAL STENT PLACEMENT (Bilateral )  Patient Location: PACU  Anesthesia Type:General  Level of Consciousness: responds to stimulation  Airway & Oxygen Therapy: Patient Spontanous Breathing and Patient connected to face mask oxygen  Post-op Assessment: Report given to RN and Post -op Vital signs reviewed and stable  Post vital signs: Reviewed and stable  Last Vitals:  Vitals Value Taken Time  BP 81/60 05/11/20 0247  Temp    Pulse 102 05/11/20 0248  Resp 24 05/11/20 0248  SpO2 98 % 05/11/20 0248  Vitals shown include unvalidated device data.  Last Pain:  Vitals:   05/10/20 2208  TempSrc: Rectal         Complications: No complications documented.

## 2020-05-11 NOTE — Op Note (Signed)
Preoperative diagnosis: Bilateral ureteral stones, bilateral renal stones, sepsis Postoperative diagnosis: Same  Procedure: Cystoscopy with bilateral retrograde pyelogram, bilateral ureteral stent placement, Foley catheter placement  Surgeon: Orlandis Sanden  Anesthesia: General  Indication for procedure: [name] is a 55 year old female with history of stroke who was noticed to have worsening mental status over the past couple of days.  She presented to emergency department and code sepsis was called.  CT scan of the abdomen and pelvis revealed an 8 mm right proximal stone and a 2 to 3 mm left distal stone with bilateral hydroureteronephrosis and bilateral renal stones of about 9 mm.  She was brought for urgent stent placement.  Findings: On cystoscopy the urethra and bladder were unremarkable.  No stone or foreign body in the bladder.  No mucosal lesions noted.  Right retrograde pyelogram-this outlined a single ureter single collecting system unit with a filling defect in the right proximal ureter consistent with the stone and proximal to this Hydro ureteral nephrosis of the pelvis and collecting system.  Left retrograde pyelogram-this outlined a single ureter single collecting system unit with a filling defect in the left distal ureter consistent with the stone and proximal hydroureteronephrosis.  Description of procedure: After consent was obtained patient brought to the operating room.  After adequate anesthesia she is placed in lithotomy position and prepped and draped in the usual sterile fashion.  A timeout was performed to confirm the patient and procedure.  We could not tell if she had been given the Maxipime based on the [employer] and the Pyxis was not allowing another 2 g to be dispensed.  We called down and talk to the ED nurse who confirmed the Maxipime was not given.  Therefore Maxipime 2 g was obtained and the infusion began.  The cystoscope was passed per urethra the bladder inspected.   The right ureteral orifice cannulated with 6 [place] open-ended catheter and retrograde injection of contrast was performed.  A straight Glidewire was advanced and coiled in the collecting system.  After wire placement copious purulent urine drained from the right side.  A 6 x 24 cm stent was advanced.  The wire was removed with a good coil seen in the right renal pelvis and a good coil in the bladder.  Attention was turned to the left side where the left UO was cannulated with a 6 [place] open-ended catheter left retrograde injection of contrast was performed.  The zip wire was advanced into the left collecting system effflux on this side was clear.  A 6 x 24 cm stent was advanced.  The wire was removed with a good coil seen in the upper pole infundibulum and a good coil in the bladder.  The stents appear to be in good position and the scope was removed.  A 16 French Silastic catheter, nonlatex was advanced into the bladder with the balloon inflated and seated at the bladder neck.  When palpating the balloon to ensure proper placement through the anterior vaginal wall a large stool ball was palpable through the posterior vaginal wall.  CRNA was kind enough to disimpact this large stool ball which should greatly help the patient.  The patient was then awakened and taken to the recovery room in guarded position.  Complications: None  Blood loss: Minimal  Specimens: None  Drains: Bilateral 6 x 24 cm ureteral stents, Foley catheter  Disposition: Patient guarded to PACU-I spoke to [employer] and went over the procedure, postop care and ultimate follow-up for ureteroscopy.

## 2020-05-11 NOTE — H&P (Addendum)
History and Physical  Julie Donovan MCN:[id number] DOB: 11-May-1965 DOA: 05/10/2020  Referring physician:  PCP: Raymondo Band, MD  Patient coming from: SNF  Chief Complaint: Altered mental status  HPI: Julie Donovan is a 55 y.o. female with medical history significant for COPD, CHF, CVA with right residual deficit, hypertension, T2DM, CKD 3A and morbid obesity who presents to the emergency department via EMS due to altered mental status.  Patient cannot provide history, history was obtained from ED physician and ED medical record.  Per report, patient started to have worsening of mental status since yesterday (patient was reported to be alert, oriented to person, place and time, though she is nonverbal at baseline).  EMS was activated and it was reported that patient's initial BP was in the 62H systolic, IV hydration 476 ml was given with improvement in SBP into the 90s, she was reported to be febrile and Tylenol 650 mg was given.  ED Course:  In the emergency department, patient was tachycardic, tachypneic, temperature was 100.2F, BP 116/86, O2 sat 97% on room air.  Work-up in the ED showed leukocytosis, BUN to creatinine 32/2.50 (was 1.16 about 11 months ago), CBG 145, lactic acid 2.5, albumin 3.1.  Influenza A, B and SARS coronavirus 2 was negative. CT chest, abdomen and pelvis without contrast showed moderate pericardial effusion, bilateral nephrolithiasis, Mild right hydronephrosis due to 8 mm proximal right ureteral stone. Mild left pelvicaliectasis due to 2 mm left UVJ stone.  CT without contrast showed no acute intracranial abnormality Chest x-ray showed cardiomegaly and bibasilar atelectasis. IV hydration per sepsis protocol was provided.  Patient was started on cefoxitin, metronidazole and vancomycin. Urologist was consulted and patient was emergently taken to the OR for bilateral ureteral stents and Foley placement.  Hospitalist was asked to admit patient for further evaluation and  management.  Review of Systems: This cannot be obtained at this time due to patient's altered mental status  Past Medical History:  Diagnosis Date  . CHF (congestive heart failure) ([place])   . CKD (chronic kidney disease)   . COPD (chronic obstructive pulmonary disease) ([place])   . CVA (cerebral vascular accident) ([place])   . Hypertension   . Type 2 diabetes mellitus ([place])    History reviewed. No pertinent surgical history.  Social History:  reports that she has quit smoking. She has quit using smokeless tobacco. She reports previous alcohol use. She reports previous drug use.   Allergies  Allergen Reactions  . Latex Other (See Comments)    On MAR    History reviewed. No pertinent family history.  Prior to Admission medications   Medication Sig Start Date End Date Taking? Authorizing Provider  acetaminophen (TYLENOL) 500 MG tablet Take 1,000 mg by mouth in the morning and at bedtime.   Yes [provider]  amLODipine (NORVASC) 10 MG tablet Take 10 mg by mouth daily.   Yes [provider]  ARIPiprazole (ABILIFY) 2 MG tablet Take 2 mg by mouth at bedtime.   Yes [provider]  aspirin 325 MG EC tablet Take 325 mg by mouth daily.   Yes [provider]  atorvastatin (LIPITOR) 80 MG tablet Take 80 mg by mouth at bedtime.   Yes [provider]  bisacodyl (DULCOLAX) 10 MG suppository Place 10 mg rectally as needed for moderate constipation.   Yes [provider]  calcium carbonate (TUMS - DOSED IN MG ELEMENTAL CALCIUM) 500 MG chewable tablet Chew 2 tablets by mouth 2 (two) times daily.  Yes [provider]  carvedilol (COREG) 12.5 MG tablet Take 12.5 mg by mouth 2 (two) times daily with a meal.   Yes [provider]  cholecalciferol (VITAMIN D3) 25 MCG (1000 UNIT) tablet Take 1,000 Units by mouth daily.   Yes [provider]  cyclobenzaprine (FLEXERIL) 10 MG tablet Take 10 mg by mouth at bedtime.   Yes  [provider]  dicyclomine (BENTYL) 10 MG capsule Take 10 mg by mouth every 8 (eight) hours as needed for spasms.   Yes [provider]  FLUoxetine (PROZAC) 20 MG capsule Take 60 mg by mouth at bedtime.   Yes [provider]  hydrALAZINE (APRESOLINE) 25 MG tablet Take 75 mg by mouth 3 (three) times daily as needed (for Hypertenstion (Hold for systolic less than 456 and diastolic less than 95)).   Yes [provider]  hyoscyamine (LEVSIN) 0.125 MG tablet Take 0.125 mg by mouth every 8 (eight) hours as needed (for IBS).   Yes [provider]  insulin glargine (LANTUS) 100 UNIT/ML injection Inject 41 Units into the skin daily.   Yes [provider]  insulin lispro (HUMALOG) 100 UNIT/ML injection Inject 2-12 Units into the skin 4 (four) times daily -  before meals and at bedtime. If 201-250=2 units 251-300=4 units 301-350=6 units 351-400=8 units 401-450=10 units 451-500=12 units If Glucose Reads High=14 units and recheck   Yes [provider]  levETIRAcetam (KEPPRA) 500 MG tablet Take 500 mg by mouth 2 (two) times daily.   Yes [provider]  linagliptin (TRADJENTA) 5 MG TABS tablet Take 5 mg by mouth daily.   Yes [provider]  lisinopril (ZESTRIL) 5 MG tablet Take 5 mg by mouth daily.   Yes [provider]  polyethylene glycol powder (GLYCOLAX/MIRALAX) 17 GM/SCOOP powder Take 17 g by mouth daily.   Yes [provider]  promethazine (PHENERGAN) 25 MG tablet Take 25 mg by mouth every 6 (six) hours as needed for nausea or vomiting.   Yes [provider]  senna-docusate (SENOKOT-S) 8.6-50 MG tablet Take 2 tablets by mouth 2 (two) times daily.   Yes [provider]  aspirin 81 MG chewable tablet Chew 81 mg by mouth daily. Patient not taking: Reported on 05/10/2020    [provider]  calcium-vitamin D (OSCAL WITH D) 500-200 MG-UNIT tablet Take 1 tablet by mouth daily with  breakfast. Patient not taking: Reported on 05/10/2020    [provider]  vitamin B-12 (CYANOCOBALAMIN) 500 MCG tablet Take 500 mcg by mouth daily. Patient not taking: Reported on 05/10/2020    [provider]    Physical Exam: BP (!) 80/51 (BP Location: Right Arm)   Pulse (!) 115   Temp (!) 97 F (36.1 C)   Resp 20   Ht _0  (1.626 m)   Wt 106.6 kg   SpO2 100%   BMI 40.34 kg/m   . General: 55 y.o. year-old female obese, ill appearing, but in no acute distress.   Marland Kitchen HEENT: NCAT, EOMI . Neck: Supple, trachea medial . Cardiovascular: Tachycardia.  Regular rate and rhythm with no rubs or gallops.  No thyromegaly or JVD noted.  2/4 pulses in all 4 extremities. Marland Kitchen Respiratory: Clear to auscultation with no wheezes or rales. Good inspiratory effort. . Abdomen: Soft nontender nondistended with normal bowel sounds x4 quadrants. . Muskuloskeletal: RUE contracted.   . Neuro: Patient was awake and able to follow simple commands . Skin: No ulcerative lesions noted or  rashes . Psychiatry: Mood is appropriate for condition and setting          Labs on Admission:  Basic Metabolic Panel: Recent Labs  Lab 05/10/20 1918 05/11/20 0149  NA 136 141  K 3.7 3.8  CL 105  --   CO2 20*  --   GLUCOSE 145*  --   BUN 32*  --   CREATININE 2.50*  --   CALCIUM 8.5*  --    Liver Function Tests: Recent Labs  Lab 05/10/20 1918  AST 29  ALT 37  ALKPHOS 96  BILITOT 0.5  PROT 6.4*  ALBUMIN 3.1*   No results for input(s): LIPASE, AMYLASE in the last 168 hours. No results for input(s): AMMONIA in the last 168 hours. CBC: Recent Labs  Lab 05/10/20 1918 05/11/20 0149  WBC 21.0*  --   NEUTROABS 18.6*  --   HGB 15.0 11.6*  HCT 45.1 34.0*  MCV 92.2  --   PLT 237  --    Cardiac Enzymes: No results for input(s): CKTOTAL, CKMB, CKMBINDEX, TROPONINI in the last 168 hours.  BNP (last 3 results) No results for input(s): BNP in the last 8760 hours.  ProBNP (last 3 results) No  results for input(s): PROBNP in the last 8760 hours.  CBG: Recent Labs  Lab 05/10/20 1929  GLUCAP 135*    Radiological Exams on Admission: CT Abdomen Pelvis Wo Contrast  Result Date: 05/10/2020 CLINICAL DATA:  Sepsis, nausea, vomiting, altered mental status EXAM: CT CHEST, ABDOMEN AND PELVIS WITHOUT CONTRAST TECHNIQUE: Multidetector CT imaging of the chest, abdomen and pelvis was performed following the standard protocol without IV contrast. COMPARISON:  None. FINDINGS: CT CHEST FINDINGS Cardiovascular: Cardiomegaly. Moderate pericardial effusion. Probable stents in the left anterior descending coronary artery. Aorta normal caliber. Mediastinum/Nodes: No mediastinal, hilar, or axillary adenopathy. Trachea and esophagus are unremarkable. Thyroid unremarkable. Lungs/Pleura: Bibasilar atelectasis. No effusions or other confluent opacities. Musculoskeletal: Chest wall soft tissues are unremarkable. No acute bony abnormality. CT ABDOMEN PELVIS FINDINGS Hepatobiliary: No focal liver abnormality is seen. Status post cholecystectomy. No biliary dilatation. Pancreas: No focal abnormality or ductal dilatation. Spleen: No focal abnormality.  Normal size. Adrenals/Urinary Tract: Adrenal glands are unremarkable. Numerous bilateral renal stones the large stone on the right layering dependently in the renal pelvis measuring 9 mm and the largest on the left layering dependently in the renal pelvis measuring 7 mm. Proximal right ureteral stone measures 8 mm. Mild right hydronephrosis. 2 mm distal left ureteral stone. Mild pelvicaliectasis on the left. Urinary bladder decompressed, unremarkable. Stomach/Bowel: Stomach, large and small bowel grossly unremarkable. Vascular/Lymphatic: No evidence of aneurysm or adenopathy. Scattered aortoiliac atherosclerosis. Reproductive: Uterus and adnexa unremarkable.  No mass. Other: Trace free fluid in the pelvis.  No free air. Musculoskeletal: No acute bony abnormality. IMPRESSION:  Cardiomegaly.  Moderate pericardial effusion. Bibasilar atelectasis. Bilateral nephrolithiasis. Mild right hydronephrosis due to 8 mm proximal right ureteral stone. Mild left pelvicaliectasis due to 2 mm left UVJ stone. Electronically Signed   By: Rolm Baptise M.D.   On: 05/10/2020 22:04   CT Head Wo Contrast  Result Date: 05/10/2020 CLINICAL DATA:  Altered mental status, sepsis EXAM: CT HEAD WITHOUT CONTRAST TECHNIQUE: Contiguous axial images were obtained from the base of the skull through the vertex without intravenous contrast. COMPARISON:  05/28/2019 FINDINGS: Brain: Bilateral basal ganglia and periventricular white matter lacunar infarcts, stable. There is atrophy and chronic small vessel disease changes. No acute intracranial abnormality. Specifically, no hemorrhage, hydrocephalus, mass lesion, acute infarction,  or significant intracranial injury. Vascular: No hyperdense vessel or unexpected calcification. Skull: No acute calvarial abnormality. Sinuses/Orbits: Visualized paranasal sinuses and mastoids clear. Orbital soft tissues unremarkable. Other: None IMPRESSION: Bilateral lacunar infarcts as above, chronic. Atrophy, chronic microvascular disease. No acute intracranial abnormality. Electronically Signed   By: Rolm Baptise M.D.   On: 05/10/2020 21:48   CT Chest Wo Contrast  Result Date: 05/10/2020 CLINICAL DATA:  Sepsis, nausea, vomiting, altered mental status EXAM: CT CHEST, ABDOMEN AND PELVIS WITHOUT CONTRAST TECHNIQUE: Multidetector CT imaging of the chest, abdomen and pelvis was performed following the standard protocol without IV contrast. COMPARISON:  None. FINDINGS: CT CHEST FINDINGS Cardiovascular: Cardiomegaly. Moderate pericardial effusion. Probable stents in the left anterior descending coronary artery. Aorta normal caliber. Mediastinum/Nodes: No mediastinal, hilar, or axillary adenopathy. Trachea and esophagus are unremarkable. Thyroid unremarkable. Lungs/Pleura: Bibasilar atelectasis.  No effusions or other confluent opacities. Musculoskeletal: Chest wall soft tissues are unremarkable. No acute bony abnormality. CT ABDOMEN PELVIS FINDINGS Hepatobiliary: No focal liver abnormality is seen. Status post cholecystectomy. No biliary dilatation. Pancreas: No focal abnormality or ductal dilatation. Spleen: No focal abnormality.  Normal size. Adrenals/Urinary Tract: Adrenal glands are unremarkable. Numerous bilateral renal stones the large stone on the right layering dependently in the renal pelvis measuring 9 mm and the largest on the left layering dependently in the renal pelvis measuring 7 mm. Proximal right ureteral stone measures 8 mm. Mild right hydronephrosis. 2 mm distal left ureteral stone. Mild pelvicaliectasis on the left. Urinary bladder decompressed, unremarkable. Stomach/Bowel: Stomach, large and small bowel grossly unremarkable. Vascular/Lymphatic: No evidence of aneurysm or adenopathy. Scattered aortoiliac atherosclerosis. Reproductive: Uterus and adnexa unremarkable.  No mass. Other: Trace free fluid in the pelvis.  No free air. Musculoskeletal: No acute bony abnormality. IMPRESSION: Cardiomegaly.  Moderate pericardial effusion. Bibasilar atelectasis. Bilateral nephrolithiasis. Mild right hydronephrosis due to 8 mm proximal right ureteral stone. Mild left pelvicaliectasis due to 2 mm left UVJ stone. Electronically Signed   By: Rolm Baptise M.D.   On: 05/10/2020 22:04   DG Chest Portable 1 View  Result Date: 05/10/2020 CLINICAL DATA:  Sepsis EXAM: PORTABLE CHEST 1 VIEW COMPARISON:  05/28/2019 FINDINGS: Cardiomegaly. Low lung volumes with bibasilar atelectasis. No effusions or overt edema. No acute bony abnormality. IMPRESSION: Low lung volumes.  Cardiomegaly, bibasilar atelectasis. Electronically Signed   By: Rolm Baptise M.D.   On: 05/10/2020 20:00    EKG: I independently viewed the EKG done and my findings are as followed: No EKG was done in the ED  Assessment/Plan Present on  Admission: . Sepsis ([place])  Active Problems:   Sepsis ([place])   Hydronephrosis with renal and ureteral calculus obstruction   Leukocytosis   Lactic acidosis   Hypoalbuminemia   Hyperglycemia   Acute kidney injury superimposed on CKD ([place])  1.Severe sepsis secondary to hydronephrosis with renal and ureteral calculus obstruction S/p cystoscopy with bilateral retrograde pyelogram, bilateral ureteral stent placement and Foley catheter placement POD 0 Patient met sepsis criteria with tachypnea, tachycardia and leukocytosis (WBC 21.0), source of infection is the kidney with bilateral renal and bilateral ureteral stones.  Lactic acid was 2.5. IV hydration per sepsis protocol was provided, patient was started on IV antibiotics with cefoxitin, Flagyl and vancomycin.  She was taken to the OR where bilateral stents were placed and Foley catheter also placed.  Unfortunately, patient's BP continues to remain soft despite IV hydration, and patient was started on IV Levophed (per RN).  Continue Tylenol as needed for fever PCCM was consulted (  Dr. Ruthann Cancer) and she graciously accepted the patient to ICU for further evaluation and management.  2.Leukocytosis in the setting of above Continue management as described above  3. Lactic acidosis Lactic acid 2.5, continue to trend lactic acid  4. Hypoalbuminemia possibly secondary to mild protein calorie malnutrition Protein supplement will be provided when patient is more stable  5. Hyperglycemia possibly secondary to T2DM Continue insulin sliding scale and hypoglycemia protocol  6. Acute kidney injury superimposed on CKD IV This is possibly secondary to #1, continue management as described in #1  DVT prophylaxis: SCDs  Code Status: Full code  Family Communication: None at bedside  Disposition Plan:  Patient is from:                        SNF Anticipated DC to:                   SNF  Anticipated DC date:               2-3 days Anticipated DC  barriers:           Patient is unstable to be discharged at this time due to sepsis secondary to bilateral renal and ureteral stones s/p bilateral stent placement POD 0    Consults called: PCCM  Admission status: Inpatient  Bernadette Hoit MD [facility]  05/11/2020, 2:59 AM

## 2020-05-11 NOTE — Anesthesia Procedure Notes (Signed)
Procedure Name: Intubation Date/Time: 05/11/2020 1:34 AM Performed by: Babs Bertin, CRNA Pre-anesthesia Checklist: Patient identified, Emergency Drugs available, Suction available and Patient being monitored Patient Re-evaluated:Patient Re-evaluated prior to induction Oxygen Delivery Method: Circle System Utilized Preoxygenation: Pre-oxygenation with 100% oxygen Induction Type: IV induction, Rapid sequence and Cricoid Pressure applied Laryngoscope Size: Mac and 3 Grade View: Grade I Tube type: Oral Tube size: 7.5 mm Number of attempts: 1 Airway Equipment and Method: Stylet and Oral airway Placement Confirmation: ETT inserted through vocal cords under direct vision,  positive ETCO2 and breath sounds checked- equal and bilateral Secured at: 21 cm Tube secured with: Tape Dental Injury: Teeth and Oropharynx as per pre-operative assessment

## 2020-05-11 NOTE — Progress Notes (Signed)
Central Line placed in [place].

## 2020-05-11 NOTE — Progress Notes (Signed)
PHARMACY - PHYSICIAN COMMUNICATION CRITICAL VALUE ALERT - BLOOD CULTURE IDENTIFICATION (BCID)  Julie Donovan is an 55 y.o. female who presented to [facility] on 05/10/2020 with a chief complaint of Sepsis, obstructing nephrolithiasis  Assessment:  Blood culture 4/4 growing GVR. BCID with proteus. WBC trending down. Tmax 100.4. LA 4.5. On Vancomycin and Cefepime and s/p OR cystoscopy w/ pyelogram and stent placement this AM.   Name of physician (or Provider) Contacted: Dr. Francine Graven  Current antibiotics: Vancomycin and Cefepime  Changes to prescribed antibiotics recommended: Continue for now -- Likely stop Vancomycin tomorrow if no further growth.   No results found for this or any previous visit.  Link Snuffer, PharmD, BCPS, BCCCP Clinical Pharmacist Please refer to [facility] for [facility] numbers 05/11/2020  12:04 PM

## 2020-05-11 NOTE — Progress Notes (Signed)
Urology Inpatient Progress Report  AKI (acute kidney injury) (HCC) [N17.9] Sepsis (HCC) [A41.9] Bilateral ureteral calculi [N20.1] Altered mental status, unspecified altered mental status type [R41.82] Sepsis with encephalopathy without septic shock, due to unspecified organism (HCC) [A41.9, R65.20, G93.40] Hydronephrosis with obstructing calculus [N13.2]  Procedure(s): CYSTOSCOPY WITH RETROGRADE PYELOGRAM/URETERAL STENT PLACEMENT  Day of Surgery   Intv/Subj: Off pressors, remains tachycardic Blood culture show Gram Variable Rod Mentally slow   Active Problems:   Sepsis (HCC)   Hydronephrosis with renal and ureteral calculus obstruction   Leukocytosis   Lactic acidosis   Hypoalbuminemia   Hyperglycemia   Acute kidney injury superimposed on CKD (HCC)   S/P cystoscopy with ureteral stent placement   Hydronephrosis with obstructing calculus  Current Facility-Administered Medications  Medication Dose Route Frequency Provider Last Rate Last Admin  . acetaminophen (TYLENOL) tablet 650 mg  650 mg Oral Q6H PRN Briant Sites, DO      . ceFEPIme (MAXIPIME) 2 g in sodium chloride 0.9 % 100 mL IVPB  2 g Intravenous Once Adefeso, Oladapo, DO      . ceFEPIme (MAXIPIME) 2 g in sodium chloride 0.9 % 100 mL IVPB  2 g Intravenous Q24H Adefeso, Oladapo, DO      . Chlorhexidine Gluconate Cloth 2 % PADS 6 each  6 each Topical Daily Melody Comas B, MD      . lactated ringers infusion   Intravenous Continuous Adefeso, Oladapo, DO 150 mL/hr at 05/11/20 1045 New Bag at 05/11/20 1045  . norepinephrine (LEVOPHED) 4mg  in premix infusion  0-60 mcg/min Intravenous Titrated , DO 18.75 mL/hr at 05/11/20 0939 5 mcg/min at 05/11/20 0939  . vancomycin variable dose per unstable renal function (pharmacist dosing)   Does not apply See admin instructions Adefeso, Oladapo, DO         Objective: Vital: Vitals:   05/11/20 0700 05/11/20 0800 05/11/20 0900 05/11/20 1000  BP: 101/68  102/65 95/60 111/65  Pulse: (!) 116 (!) 114 (!) 110 (!) 111  Resp: 17 (!) 34 (!) 30 (!) 26  Temp: (!) 100.4 F (38 C)     TempSrc: Axillary     SpO2: 94% 94% 94% 99%  Weight:      Height:       I/Os: I/O last 3 completed shifts: In: 2600 [I.V.:1400; IV Piggyback:1200] Out: 500 [Urine:500]  Physical Exam:  General: Patient is in moderate apparent distress Lungs: Normal respiratory effort, chest expands symmetrically.  No-rebreather face mask GI:  The abdomen is soft and nontender without mass. Foley: turbid  Ext: lower extremities symmetric  Lab Results: Recent Labs    05/10/20 1918 05/11/20 0149 05/11/20 0500  WBC 21.0*  --  17.6*  HGB 15.0 11.6* 11.8*  HCT 45.1 34.0* 36.8   Recent Labs    05/10/20 1918 05/11/20 0149 05/11/20 0500  NA 136 141 139  K 3.7 3.8 3.1*  CL 105  --  108  CO2 20*  --  17*  GLUCOSE 145*  --  129*  BUN 32*  --  37*  CREATININE 2.50*  --  2.51*  CALCIUM 8.5*  --  7.8*   Recent Labs    05/10/20 1918  INR 1.4*   No results for input(s): LABURIN in the last 72 hours. Results for orders placed or performed during the hospital encounter of 05/10/20  Culture, blood (Routine x 2)     Status: Abnormal (Preliminary result)   Collection Time: 05/10/20  7:20 PM   Specimen: BLOOD  Result Value Ref Range Status   Specimen Description BLOOD SITE NOT SPECIFIED  Final   Special Requests   Final    BOTTLES DRAWN AEROBIC AND ANAEROBIC Blood Culture results may not be optimal due to an inadequate volume of blood received in culture bottles   Culture  Setup Time (A)  Final    GRAM VARIABLE ROD IN BOTH AEROBIC AND ANAEROBIC BOTTLES Organism ID to follow Performed at [facility], [address], [place], [place] [address]    Culture PENDING  Incomplete   Report Status PENDING  Incomplete  Resp Panel by RT-PCR (Flu A&B, Covid) Nasopharyngeal Swab     Status: None   Collection Time: 05/10/20  7:45 PM   Specimen: Nasopharyngeal Swab;  Nasopharyngeal(NP) swabs in vial transport medium  Result Value Ref Range Status   SARS Coronavirus 2 by RT PCR NEGATIVE NEGATIVE Final    Comment: (NOTE) SARS-CoV-2 target nucleic acids are NOT DETECTED.  The SARS-CoV-2 RNA is generally detectable in upper respiratory specimens during the acute phase of infection. The lowest concentration of SARS-CoV-2 viral copies this assay can detect is 138 copies/mL. A negative result does not preclude SARS-Cov-2 infection and should not be used as the sole basis for treatment or other patient management decisions. A negative result may occur with  improper specimen collection/handling, submission of specimen other than nasopharyngeal swab, presence of viral mutation(s) within the areas targeted by this assay, and inadequate number of viral copies(<138 copies/mL). A negative result must be combined with clinical observations, patient history, and epidemiological information. The expected result is Negative.  Fact Sheet for Patients:  BloggerCourse.com  Fact Sheet for Healthcare Providers:  SeriousBroker.it  This test is no t yet approved or cleared by the [place] FDA and  has been authorized for detection and/or diagnosis of SARS-CoV-2 by FDA under an Emergency Use Authorization (EUA). This EUA will remain  in effect (meaning this test can be used) for the duration of the COVID-19 declaration under Section 564(b)(1) of the Act, 21 U.S.C.section 360bbb-3(b)(1), unless the authorization is terminated  or revoked sooner.       Influenza A by PCR NEGATIVE NEGATIVE Final   Influenza B by PCR NEGATIVE NEGATIVE Final    Comment: (NOTE) The Xpert Xpress SARS-CoV-2/FLU/RSV plus assay is intended as an aid in the diagnosis of influenza from Nasopharyngeal swab specimens and should not be used as a sole basis for treatment. Nasal washings and aspirates are unacceptable for Xpert Xpress  SARS-CoV-2/FLU/RSV testing.  Fact Sheet for Patients: BloggerCourse.com  Fact Sheet for Healthcare Providers: SeriousBroker.it  This test is not yet approved or cleared by the [place] FDA and has been authorized for detection and/or diagnosis of SARS-CoV-2 by FDA under an Emergency Use Authorization (EUA). This EUA will remain in effect (meaning this test can be used) for the duration of the COVID-19 declaration under Section 564(b)(1) of the Act, 21 U.S.C. section 360bbb-3(b)(1), unless the authorization is terminated or revoked.  Performed at [facility], [address], [place], [place] [address]   Culture, blood (Routine x 2)     Status: Abnormal (Preliminary result)   Collection Time: 05/10/20  9:06 PM   Specimen: BLOOD  Result Value Ref Range Status   Specimen Description BLOOD SITE NOT SPECIFIED  Final   Special Requests   Final    BOTTLES DRAWN AEROBIC AND ANAEROBIC Blood Culture results may not be optimal due to an inadequate volume of blood received in  culture bottles   Culture  Setup Time (A)  Final    GRAM VARIABLE ROD IN BOTH AEROBIC AND ANAEROBIC BOTTLES CRITICAL VALUE NOTED.  VALUE IS CONSISTENT WITH PREVIOUSLY REPORTED AND CALLED VALUE. Performed at [facility], [address] [place], [place] [address]    Culture PENDING  Incomplete   Report Status PENDING  Incomplete  Urine Culture     Status: None (Preliminary result)   Collection Time: 05/11/20  2:07 AM   Specimen: Urine, Cystoscope  Result Value Ref Range Status   Specimen Description CYSTOSCOPY URINE, CATHETERIZED  Final   Special Requests   Final    PATIENT ON FOLLOWING MAXAPIME Performed at [facility], [address], [place], [place] [address]    Culture PENDING  Incomplete   Report Status PENDING  Incomplete  MRSA PCR Screening     Status: None   Collection Time: 05/11/20  5:37 AM   Specimen: Nasal Mucosa;  Nasopharyngeal  Result Value Ref Range Status   MRSA by PCR NEGATIVE NEGATIVE Final    Comment:        The GeneXpert MRSA Assay (FDA approved for NASAL specimens only), is one component of a comprehensive MRSA colonization surveillance program. It is not intended to diagnose MRSA infection nor to guide or monitor treatment for MRSA infections. Performed at [facility], [address], [place], [place] [address]     Studies/Results: CT Abdomen Pelvis Wo Contrast  Result Date: 05/10/2020 CLINICAL DATA:  Sepsis, nausea, vomiting, altered mental status EXAM: CT CHEST, ABDOMEN AND PELVIS WITHOUT CONTRAST TECHNIQUE: Multidetector CT imaging of the chest, abdomen and pelvis was performed following the standard protocol without IV contrast. COMPARISON:  None. FINDINGS: CT CHEST FINDINGS Cardiovascular: Cardiomegaly. Moderate pericardial effusion. Probable stents in the left anterior descending coronary artery. Aorta normal caliber. Mediastinum/Nodes: No mediastinal, hilar, or axillary adenopathy. Trachea and esophagus are unremarkable. Thyroid unremarkable. Lungs/Pleura: Bibasilar atelectasis. No effusions or other confluent opacities. Musculoskeletal: Chest wall soft tissues are unremarkable. No acute bony abnormality. CT ABDOMEN PELVIS FINDINGS Hepatobiliary: No focal liver abnormality is seen. Status post cholecystectomy. No biliary dilatation. Pancreas: No focal abnormality or ductal dilatation. Spleen: No focal abnormality.  Normal size. Adrenals/Urinary Tract: Adrenal glands are unremarkable. Numerous bilateral renal stones the large stone on the right layering dependently in the renal pelvis measuring 9 mm and the largest on the left layering dependently in the renal pelvis measuring 7 mm. Proximal right ureteral stone measures 8 mm. Mild right hydronephrosis. 2 mm distal left ureteral stone. Mild pelvicaliectasis on the left. Urinary bladder decompressed, unremarkable. Stomach/Bowel:  Stomach, large and small bowel grossly unremarkable. Vascular/Lymphatic: No evidence of aneurysm or adenopathy. Scattered aortoiliac atherosclerosis. Reproductive: Uterus and adnexa unremarkable.  No mass. Other: Trace free fluid in the pelvis.  No free air. Musculoskeletal: No acute bony abnormality. IMPRESSION: Cardiomegaly.  Moderate pericardial effusion. Bibasilar atelectasis. Bilateral nephrolithiasis. Mild right hydronephrosis due to 8 mm proximal right ureteral stone. Mild left pelvicaliectasis due to 2 mm left UVJ stone. Electronically Signed   By: Charlett Nose M.D.   On: 05/10/2020 22:04   CT Head Wo Contrast  Result Date: 05/10/2020 CLINICAL DATA:  Altered mental status, sepsis EXAM: CT HEAD WITHOUT CONTRAST TECHNIQUE: Contiguous axial images were obtained from the base of the skull through the vertex without intravenous contrast. COMPARISON:  05/28/2019 FINDINGS: Brain: Bilateral basal ganglia and periventricular white matter lacunar infarcts, stable. There is atrophy and chronic small vessel disease changes. No  acute intracranial abnormality. Specifically, no hemorrhage, hydrocephalus, mass lesion, acute infarction, or significant intracranial injury. Vascular: No hyperdense vessel or unexpected calcification. Skull: No acute calvarial abnormality. Sinuses/Orbits: Visualized paranasal sinuses and mastoids clear. Orbital soft tissues unremarkable. Other: None IMPRESSION: Bilateral lacunar infarcts as above, chronic. Atrophy, chronic microvascular disease. No acute intracranial abnormality. Electronically Signed   By: Charlett NoseKevin  Dover M.D.   On: 05/10/2020 21:48   CT Chest Wo Contrast  Result Date: 05/10/2020 CLINICAL DATA:  Sepsis, nausea, vomiting, altered mental status EXAM: CT CHEST, ABDOMEN AND PELVIS WITHOUT CONTRAST TECHNIQUE: Multidetector CT imaging of the chest, abdomen and pelvis was performed following the standard protocol without IV contrast. COMPARISON:  None. FINDINGS: CT CHEST FINDINGS  Cardiovascular: Cardiomegaly. Moderate pericardial effusion. Probable stents in the left anterior descending coronary artery. Aorta normal caliber. Mediastinum/Nodes: No mediastinal, hilar, or axillary adenopathy. Trachea and esophagus are unremarkable. Thyroid unremarkable. Lungs/Pleura: Bibasilar atelectasis. No effusions or other confluent opacities. Musculoskeletal: Chest wall soft tissues are unremarkable. No acute bony abnormality. CT ABDOMEN PELVIS FINDINGS Hepatobiliary: No focal liver abnormality is seen. Status post cholecystectomy. No biliary dilatation. Pancreas: No focal abnormality or ductal dilatation. Spleen: No focal abnormality.  Normal size. Adrenals/Urinary Tract: Adrenal glands are unremarkable. Numerous bilateral renal stones the large stone on the right layering dependently in the renal pelvis measuring 9 mm and the largest on the left layering dependently in the renal pelvis measuring 7 mm. Proximal right ureteral stone measures 8 mm. Mild right hydronephrosis. 2 mm distal left ureteral stone. Mild pelvicaliectasis on the left. Urinary bladder decompressed, unremarkable. Stomach/Bowel: Stomach, large and small bowel grossly unremarkable. Vascular/Lymphatic: No evidence of aneurysm or adenopathy. Scattered aortoiliac atherosclerosis. Reproductive: Uterus and adnexa unremarkable.  No mass. Other: Trace free fluid in the pelvis.  No free air. Musculoskeletal: No acute bony abnormality. IMPRESSION: Cardiomegaly.  Moderate pericardial effusion. Bibasilar atelectasis. Bilateral nephrolithiasis. Mild right hydronephrosis due to 8 mm proximal right ureteral stone. Mild left pelvicaliectasis due to 2 mm left UVJ stone. Electronically Signed   By: Charlett NoseKevin  Dover M.D.   On: 05/10/2020 22:04   DG Cystogram  Result Date: 05/11/2020 CLINICAL DATA:  Hydronephrosis due to RIGHT ureteral stone. EXAM: Retrograde pyelogram/ureteral stent placement. COMPARISON:  CT abdomen dated 05/10/2020. FINDINGS: Three  fluoroscopic images are provided of a retrograde pyelogram. Final images show bilateral nephroureteral stents in place, grossly well positioned at the levels of the renal pelves. Fluoroscopy provided for 53 seconds. IMPRESSION: Intraoperative fluoroscopic images demonstrating bilateral nephroureteral stents in place, grossly well positioned at the levels of the renal pelves. Electronically Signed   By: Bary RichardStan  Maynard M.D.   On: 05/11/2020 07:05   DG Chest Portable 1 View  Result Date: 05/10/2020 CLINICAL DATA:  Sepsis EXAM: PORTABLE CHEST 1 VIEW COMPARISON:  05/28/2019 FINDINGS: Cardiomegaly. Low lung volumes with bibasilar atelectasis. No effusions or overt edema. No acute bony abnormality. IMPRESSION: Low lung volumes.  Cardiomegaly, bibasilar atelectasis. Electronically Signed   By: Charlett NoseKevin  Dover M.D.   On: 05/10/2020 20:00    Assessment: Procedure(s): CYSTOSCOPY WITH RETROGRADE PYELOGRAM/URETERAL STENT PLACEMENT, Day of Surgery  - urospesis, now off pressors, blood cultures showing Gram Variable Rods.  Plan: Will need f/u bilateral ureteroscopy in several weeks once she has improved from her infection and it has been completely treated.  We will contact her with a surgery date for Dr. Mena GoesEskridge to remove her stones.  Will see PRN at this point, please recontact Urology for additional questions/concerns.   Berniece SalinesBenjamin Mirta Mally, MD Urology 05/11/2020,  11:08 AM

## 2020-05-11 NOTE — Progress Notes (Signed)
Patient's spo2 decrased to 83% on 4L [place]. RT placed patient on 14L/55% Venturi Mask. Patient has very weak cough and unable to clear secretions. RT nasotracheal suctioned patient and obtained moderate amount of thick, white secretions. Pt's spo2 increased to 96%. CCM is aware. RT will continue to monitor as needed.

## 2020-05-11 NOTE — Anesthesia Postprocedure Evaluation (Signed)
Anesthesia Post Note  Patient: Antionette Luster  Procedure(s) Performed: CYSTOSCOPY WITH RETROGRADE PYELOGRAM/URETERAL STENT PLACEMENT (Bilateral )     Patient location during evaluation: PACU Anesthesia Type: General Level of consciousness: sedated Pain management: pain level controlled Vital Signs Assessment: post-procedure vital signs reviewed and stable Respiratory status: spontaneous breathing, nonlabored ventilation, respiratory function stable and patient connected to face mask oxygen Cardiovascular status: unstable and tachycardic (On Levophed. CCM consulted.) Postop Assessment: no apparent nausea or vomiting Anesthetic complications: no   No complications documented.  Last Vitals:  Vitals:   05/11/20 0250 05/11/20 0300  BP: (!) 81/60 98/63  Pulse: (!) 103 100  Resp: (!) 24 (!) 25  Temp:    SpO2: 100% 98%    Last Pain:  Vitals:   05/10/20 2208  TempSrc: Rectal                 Sayla Golonka,W. EDMOND

## 2020-05-11 NOTE — Anesthesia Procedure Notes (Signed)
Central Venous Catheter Insertion Performed by: Gaynelle Adu, MD, anesthesiologist Start/End4/17/2022 12:05 AM, 05/11/2020 12:20 AM Patient location: Pre-op. Preanesthetic checklist: patient identified, IV checked, site marked, risks and benefits discussed, surgical consent, monitors and equipment checked, pre-op evaluation, timeout performed and anesthesia consent Position: Trendelenburg Lidocaine 1% used for infiltration and patient sedated Hand hygiene performed , maximum sterile barriers used  and Seldinger technique used Catheter size: 8 Fr Total catheter length 16. Central line was placed.Double lumen Procedure performed using ultrasound guided technique. Ultrasound Notes:anatomy identified, needle tip was noted to be adjacent to the nerve/plexus identified, no ultrasound evidence of intravascular and/or intraneural injection and image(s) printed for medical record Attempts: 1 Following insertion, dressing applied, line sutured and Biopatch. Post procedure assessment: blood return through all ports  Patient tolerated the procedure well with no immediate complications.

## 2020-05-11 NOTE — Anesthesia Preprocedure Evaluation (Addendum)
Anesthesia Evaluation  Patient identified by MRN, date of birth, ID band Patient awake    Reviewed: Allergy & Precautions, H&P , NPO status , Patient's Chart, lab work & pertinent test results  Airway Mallampati: III  TM Distance: >3 FB Neck ROM: Full    Dental no notable dental hx. (+) Poor Dentition, Dental Advisory Given   Pulmonary COPD,  COPD inhaler, former smoker,    Pulmonary exam normal breath sounds clear to auscultation       Cardiovascular hypertension, Pt. on medications and Pt. on home beta blockers +CHF   Rhythm:Regular Rate:Tachycardia     Neuro/Psych CVA, Residual Symptoms negative psych ROS   GI/Hepatic negative GI ROS, Neg liver ROS,   Endo/Other  diabetes, Insulin DependentMorbid obesity  Renal/GU Renal disease  negative genitourinary   Musculoskeletal   Abdominal   Peds  Hematology negative hematology ROS (+)   Anesthesia Other Findings   Reproductive/Obstetrics negative OB ROS                            Anesthesia Physical Anesthesia Plan  ASA: III and emergent  Anesthesia Plan: General   Post-op Pain Management:    Induction: Intravenous, Rapid sequence and Cricoid pressure planned  PONV Risk Score and Plan: 4 or greater and Ondansetron, Dexamethasone and Midazolam  Airway Management Planned: Oral ETT  Additional Equipment: Arterial line, CVP and Ultrasound Guidance Line Placement  Intra-op Plan:   Post-operative Plan: Extubation in OR and Possible Post-op intubation/ventilation  Informed Consent: I have reviewed the patients History and Physical, chart, labs and discussed the procedure including the risks, benefits and alternatives for the proposed anesthesia with the patient or authorized representative who has indicated his/her understanding and acceptance.     Dental advisory given  Plan Discussed with: CRNA  Anesthesia Plan Comments:         Anesthesia Quick Evaluation

## 2020-05-11 NOTE — Progress Notes (Signed)
NAME:  Julie Donovan, MRN:  [id number], DOB:  06/22/1965, LOS: 0 ADMISSION DATE:  05/10/2020, CONSULTATION DATE:  05/11/20 REFERRING MD:  Dr Thomes Dinning, CHIEF COMPLAINT:  shock  History of Present Illness:  55 yo with h/o COPD, HTN, DM2, cva and residual R sided weakness, reportedly non verbal at baseline who presented with ams found to have obstructing nephrolithiasis with bilateral hydronephrosis. Pt was taken to OR for stent placement and subsequently has become hypotensive for which ccm has been consulted to take to ICU for pressor support.   Pertinent  Medical History  R sided deficits Nonverbal baseline  Significant Hospital Events: Including procedures, antibiotic start and stop dates in addition to other pertinent events   . 4/17: bilateral ureteral stents . 4/16: flagyl/vanc/cefoxitin  Interim History / Subjective:  No acute events since admission to the hospital. She was weaned down to 53mcg/min of levophed at the start of the shift and was weaned off once given a 1L bolus of LR.   Patient denies any pain.   Objective   Blood pressure 127/63, pulse 65, temperature 100 F (37.8 C), temperature source Axillary, resp. rate 13, height 5\' 4"  (1.626 m), weight 106.6 kg, SpO2 100 %.        Intake/Output Summary (Last 24 hours) at 05/11/2020 1411 Last data filed at 05/11/2020 1347 Gross per 24 hour  Intake 5162.45 ml  Output 775 ml  Net 4387.45 ml   Filed Weights   05/10/20 2207  Weight: 106.6 kg    Examination: General: mild distress, diaphoretic HENT: ncat, PERRL, dry mucous membranes Lungs: clear to auscultation. No wheezing or rhonchi. Cardiovascular: rrr, s1s2, no murmurs Abdomen: non-distended, non-tender, BS+ Extremities: + edema in all extremities with R UE contraction Neuro: residual right side deficits, non-verbal  Labs/imaging that I have personally reviewed  (right click and "Reselect all SmartList Selections" daily)  Lactic Acid 4.7 < 4.5  Mg 1.2 Phos 3   Cr 2.51  CT Head 4/16 Bilateral lacunar infarcts as above, chronic.  Resolved Hospital Problem list     Assessment & Plan:  Septic shock 2/2 acute obstructing nephrolithiasis and bacteremia B/l obstructing nephrolithiasis Bilateral hydronephrosis - s/p stents with urology - Vasopressors have been titrated off - continue fluid blouses as needed - blood cultures showing mixed organisms, enterobacterales/proteus - Continue Cefepime + vancomycin - cont abx.   Acute Kidney Injury  Lactic Acidosis - trend lactates - fluid boluses as above - trend BMP  Acute infectious encephalopathy:  -2/2 sepsis -cth negative -follow mental status -non verbal at baseline   T2dm:  -monitor  H/o cva:  -hronic R deficits.   Best practice (right click and "Reselect all SmartList Selections" daily)  Diet:  NPO Pain/Anxiety/Delirium protocol (if indicated): No VAP protocol (if indicated): Not indicated DVT prophylaxis: SCD GI prophylaxis: N/A Glucose control:  SSI Yes Central venous access:  Yes, and it is still needed Arterial line:  Yes, and it is still needed Foley:  Yes, and it is still needed Mobility:  bed rest  PT consulted: N/A Last date of multidisciplinary goals of care discussion [pending] Code Status:  full code Disposition: ICU  Labs   CBC: Recent Labs  Lab 05/10/20 1918 05/11/20 0149 05/11/20 0500 05/11/20 0713  WBC 21.0*  --  17.6* 22.5*  NEUTROABS 18.6*  --   --   --   HGB 15.0 11.6* 11.8* 11.4*  HCT 45.1 34.0* 36.8 36.0  MCV 92.2  --  94.6 98.4  PLT 237  --  127* 110*    Basic Metabolic Panel: Recent Labs  Lab 05/10/20 1918 05/11/20 0149 05/11/20 0500 05/11/20 0713  NA 136 141 139  --   K 3.7 3.8 3.1*  --   CL 105  --  108  --   CO2 20*  --  17*  --   GLUCOSE 145*  --  129*  --   BUN 32*  --  37*  --   CREATININE 2.50*  --  2.51*  --   CALCIUM 8.5*  --  7.8*  --   MG  --   --   --  1.2*  PHOS  --   --   --  3.0   GFR: Estimated Creatinine  Clearance: 30.2 mL/min (A) (by C-G formula based on SCr of 2.51 mg/dL (H)). Recent Labs  Lab 05/10/20 1918 05/11/20 0500 05/11/20 0605 05/11/20 0713 05/11/20 1143  WBC 21.0* 17.6*  --  22.5*  --   LATICACIDVEN 2.5*  --  4.5*  --  4.7*    Liver Function Tests: Recent Labs  Lab 05/10/20 1918  AST 29  ALT 37  ALKPHOS 96  BILITOT 0.5  PROT 6.4*  ALBUMIN 3.1*   No results for input(s): LIPASE, AMYLASE in the last 168 hours. No results for input(s): AMMONIA in the last 168 hours.  ABG    Component Value Date/Time   PHART 7.251 (L) 05/11/2020 0149   PCO2ART 42.5 05/11/2020 0149   PO2ART 373 (H) 05/11/2020 0149   HCO3 18.3 (L) 05/11/2020 0149   TCO2 19 (L) 05/11/2020 0149   ACIDBASEDEF 8.0 (H) 05/11/2020 0149   O2SAT 100.0 05/11/2020 0149     Coagulation Profile: Recent Labs  Lab 05/10/20 1918 05/11/20 0713  INR 1.4* 1.8*    Cardiac Enzymes: No results for input(s): CKTOTAL, CKMB, CKMBINDEX, TROPONINI in the last 168 hours.  HbA1C: No results found for: HGBA1C  CBG: Recent Labs  Lab 05/10/20 1929 05/11/20 0509 05/11/20 0738 05/11/20 1144  GLUCAP 135* 92 134* 122*    Critical care time: 45 minutes    Melody Comas, MD [facility] Office: [phone]   See Amion for personal pager PCCM on call pager [phone] until 7pm. Please call Elink 7p-7a. [phone]

## 2020-05-11 NOTE — Progress Notes (Signed)
Pharmacy Antibiotic Note  Jessikah Dicker is a 55 y.o. female admitted on 05/10/2020 with sepsis.  Pharmacy has been consulted for vancomycin and cefepime dosing.  Proteus bacteremia - cultures still pending.  SCr stable last 24 hrs at 2.51 with estimated CrCl ~30.  Will bump up Cefepime dose to q12 hours and watch SCr/UOP closely.   Plan: Change to Cefepime 2g IV every 12 hours Vancomycin  variable dosing due to unstable renal function >> Checking Vancomycin random level in AM to assess clearance Monitor renal function, Cx and clinical progression to narrow Vancomycin levels as needed  Height: 5\' 4"  (162.6 cm) Weight: 106.6 kg (235 lb) IBW/kg (Calculated) : 54.7  Temp (24hrs), Avg:99.4 F (37.4 C), Min:97 F (36.1 C), Max:100.4 F (38 C)  Recent Labs  Lab 05/10/20 1918 05/11/20 0500 05/11/20 0605 05/11/20 0713 05/11/20 1143  WBC 21.0* 17.6*  --  22.5*  --   CREATININE 2.50* 2.51*  --   --   --   LATICACIDVEN 2.5*  --  4.5*  --  4.7*    Estimated Creatinine Clearance: 30.2 mL/min (A) (by C-G formula based on SCr of 2.51 mg/dL (H)).    Allergies  Allergen Reactions  . Latex Other (See Comments)    On MAR    05/13/20, PharmD, BCPS, BCCCP Clinical Pharmacist Please refer to [facility] for [facility] Pharmacy numbers 05/11/2020 2:24 PM

## 2020-05-11 NOTE — Progress Notes (Signed)
Placed pt on 2L [place] per ABG results.

## 2020-05-12 ENCOUNTER — Encounter (HOSPITAL_COMMUNITY): Payer: Self-pay | Admitting: Urology

## 2020-05-12 ENCOUNTER — Other Ambulatory Visit: Payer: Self-pay | Admitting: Urology

## 2020-05-12 DIAGNOSIS — J449 Chronic obstructive pulmonary disease, unspecified: Secondary | ICD-10-CM

## 2020-05-12 DIAGNOSIS — N189 Chronic kidney disease, unspecified: Secondary | ICD-10-CM

## 2020-05-12 DIAGNOSIS — R739 Hyperglycemia, unspecified: Secondary | ICD-10-CM | POA: Diagnosis not present

## 2020-05-12 DIAGNOSIS — E872 Acidosis: Secondary | ICD-10-CM

## 2020-05-12 DIAGNOSIS — R6520 Severe sepsis without septic shock: Secondary | ICD-10-CM

## 2020-05-12 DIAGNOSIS — N179 Acute kidney failure, unspecified: Secondary | ICD-10-CM

## 2020-05-12 DIAGNOSIS — J9601 Acute respiratory failure with hypoxia: Secondary | ICD-10-CM

## 2020-05-12 DIAGNOSIS — E8809 Other disorders of plasma-protein metabolism, not elsewhere classified: Secondary | ICD-10-CM

## 2020-05-12 DIAGNOSIS — Z960 Presence of urogenital implants: Secondary | ICD-10-CM

## 2020-05-12 DIAGNOSIS — A419 Sepsis, unspecified organism: Secondary | ICD-10-CM

## 2020-05-12 DIAGNOSIS — B964 Proteus (mirabilis) (morganii) as the cause of diseases classified elsewhere: Secondary | ICD-10-CM

## 2020-05-12 DIAGNOSIS — I69320 Aphasia following cerebral infarction: Secondary | ICD-10-CM

## 2020-05-12 DIAGNOSIS — N132 Hydronephrosis with renal and ureteral calculous obstruction: Secondary | ICD-10-CM

## 2020-05-12 DIAGNOSIS — E669 Obesity, unspecified: Secondary | ICD-10-CM

## 2020-05-12 DIAGNOSIS — I129 Hypertensive chronic kidney disease with stage 1 through stage 4 chronic kidney disease, or unspecified chronic kidney disease: Secondary | ICD-10-CM

## 2020-05-12 LAB — CBC
HCT: 34.5 % — ABNORMAL LOW (ref 36.0–46.0)
Hemoglobin: 11.3 g/dL — ABNORMAL LOW (ref 12.0–15.0)
MCH: 31 pg (ref 26.0–34.0)
MCHC: 32.8 g/dL (ref 30.0–36.0)
MCV: 94.5 fL (ref 80.0–100.0)
Platelets: 80 10*3/uL — ABNORMAL LOW (ref 150–400)
RBC: 3.65 MIL/uL — ABNORMAL LOW (ref 3.87–5.11)
RDW: 13.6 % (ref 11.5–15.5)
WBC: 22.5 10*3/uL — ABNORMAL HIGH (ref 4.0–10.5)
nRBC: 0 % (ref 0.0–0.2)

## 2020-05-12 LAB — GLUCOSE, CAPILLARY
Glucose-Capillary: 105 mg/dL — ABNORMAL HIGH (ref 70–99)
Glucose-Capillary: 72 mg/dL (ref 70–99)
Glucose-Capillary: 72 mg/dL (ref 70–99)
Glucose-Capillary: 113 mg/dL — ABNORMAL HIGH (ref 70–99)
Glucose-Capillary: 57 mg/dL — ABNORMAL LOW (ref 70–99)
Glucose-Capillary: 107 mg/dL — ABNORMAL HIGH (ref 70–99)
Glucose-Capillary: 143 mg/dL — ABNORMAL HIGH (ref 70–99)
Glucose-Capillary: 159 mg/dL — ABNORMAL HIGH (ref 70–99)

## 2020-05-12 LAB — BASIC METABOLIC PANEL
Anion gap: 6 (ref 5–15)
BUN: 28 mg/dL — ABNORMAL HIGH (ref 6–20)
CO2: 22 mmol/L (ref 22–32)
Calcium: 8 mg/dL — ABNORMAL LOW (ref 8.9–10.3)
Chloride: 112 mmol/L — ABNORMAL HIGH (ref 98–111)
Creatinine, Ser: 1.48 mg/dL — ABNORMAL HIGH (ref 0.44–1.00)
GFR, Estimated: 42 mL/min — ABNORMAL LOW (ref >60)
Glucose, Bld: 87 mg/dL (ref 70–99)
Potassium: 3.9 mmol/L (ref 3.5–5.1)
Sodium: 140 mmol/L (ref 135–145)

## 2020-05-12 LAB — PHOSPHORUS: Phosphorus: 1.1 mg/dL — ABNORMAL LOW (ref 2.5–4.6)

## 2020-05-12 LAB — MAGNESIUM: Magnesium: 1.6 mg/dL — ABNORMAL LOW (ref 1.7–2.4)

## 2020-05-12 LAB — VANCOMYCIN, RANDOM: Vancomycin Rm: 13

## 2020-05-12 MED ORDER — SODIUM CHLORIDE 0.9% FLUSH
10.0000 mL | Freq: Two times a day (BID) | INTRAVENOUS | Status: DC
  Administered 2020-05-12 – 2020-05-18 (×14): 10 mL
  Administered 2020-05-19: 20 mL
  Administered 2020-05-20: 10 mL
  Administered 2020-05-20: 20 mL
  Administered 2020-05-21 – 2020-05-22 (×2): 10 mL

## 2020-05-12 MED ORDER — OSMOLITE 1.5 CAL PO LIQD
1000.0000 mL | ORAL | Status: DC
  Administered 2020-05-12 – 2020-05-13 (×2): 1000 mL
  Filled 2020-05-12 (×5): qty 1000

## 2020-05-12 MED ORDER — LEVETIRACETAM IN NACL 500 MG/100ML IV SOLN
500.0000 mg | Freq: Two times a day (BID) | INTRAVENOUS | Status: DC
  Administered 2020-05-12 – 2020-05-16 (×8): 500 mg via INTRAVENOUS
  Filled 2020-05-12 (×8): qty 100

## 2020-05-12 MED ORDER — HEPARIN SODIUM (PORCINE) 5000 UNIT/ML IJ SOLN
5000.0000 [IU] | Freq: Three times a day (TID) | INTRAMUSCULAR | Status: DC
  Administered 2020-05-12 – 2020-05-14 (×6): 5000 [IU] via SUBCUTANEOUS
  Filled 2020-05-12 (×6): qty 1

## 2020-05-12 MED ORDER — PROSOURCE TF PO LIQD
45.0000 mL | Freq: Every day | ORAL | Status: DC
  Administered 2020-05-12 – 2020-05-16 (×5): 45 mL
  Filled 2020-05-12 (×5): qty 45

## 2020-05-12 MED ORDER — SODIUM CHLORIDE 0.9% FLUSH: 10.0000 mL | INTRAVENOUS | Status: DC | PRN

## 2020-05-12 MED ORDER — DEXTROSE 50 % IV SOLN
1.0000 | Freq: Once | INTRAVENOUS | Status: AC
  Administered 2020-05-12: 50 mL via INTRAVENOUS

## 2020-05-12 MED ORDER — DEXTROSE 5 % IV SOLN: INTRAVENOUS | Status: DC

## 2020-05-12 MED ORDER — DEXTROSE 50 % IV SOLN
INTRAVENOUS | Status: AC
  Filled 2020-05-12: qty 50

## 2020-05-12 MED ORDER — SODIUM CHLORIDE 0.9 % IV SOLN
2.0000 g | INTRAVENOUS | Status: DC
  Administered 2020-05-12 – 2020-05-13 (×2): 2 g via INTRAVENOUS
  Filled 2020-05-12 (×2): qty 20

## 2020-05-12 NOTE — Consult Note (Signed)
Date of Admission:  05/10/2020          Reason for Consult: Proteus bacteremia w sepsis   Referring Provider: Melody Comas, MD   Assessment:  1. Proteus bacteremia due to 2. Urinary tract infection in the context of multiple stones including obstructive ones status post cystoscopy with bilateral retrograde pyelograms and bilateral ureteral stent placement 3. 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 COPD 5. Hypertension 6. Obesity  Plan:  1. Agree with ceftriaxone which she has been narrowed to presently 2. Repeat blood cultures 3. Follow-up on culture data and sensitivities and as she improves can hopefully change her to oral therapy to complete a treatment regimen  (Typically would  Just do  7 days in total.--though IF Urology are planning on further interventions in near future might continue with po a bit longer)   Active Problems:   Sepsis (HCC)   Hydronephrosis with renal and ureteral calculus obstruction   Leukocytosis   Lactic acidosis   Hypoalbuminemia   Hyperglycemia   Acute kidney injury superimposed on CKD (HCC)   S/P cystoscopy with ureteral stent placement   Hydronephrosis with obstructing calculus   Scheduled Meds: . Chlorhexidine Gluconate Cloth  6 each Topical Daily  . dextrose      . feeding supplement (PROSource TF)  45 mL Per Tube Daily  . heparin injection (subcutaneous)  5,000 Units Subcutaneous Q8H  . sodium chloride flush  10-40 mL Intracatheter Q12H   Continuous Infusions: . cefTRIAXone (ROCEPHIN)  IV Stopped (05/12/20 1456)  . dextrose 50 mL/hr at 05/12/20 1500  . feeding supplement (OSMOLITE 1.5 CAL) 1,000 mL (05/12/20 1344)  . levETIRAcetam    . norepinephrine (LEVOPHED) Adult infusion Stopped (05/11/20 1039)   PRN Meds:.acetaminophen, sodium chloride flush  HPI: Julie Donovan is a 55 y.o. female with history of stroke and right-sided weakness along with some apparent expressive aphasia presented to [facility] emergency room with confusion.  Apparently patient had worsening cognition over 24 hours prior to mission.  EMS arrived and found her blood pressure in the 70s.  She was fluid resuscitated brought the emergency department here she was tachycardic and tachypneic and febrile.  UA showed significant pyuria.  She had evidence of severe sepsis.  CT of the chest abdomen pelvis without contrast showed a moderate pericardial effusion bias basilar atelectasis with extensive bilateral nephrolithiasis obstructive uropathy and stones involving both ureters.  She was seen by urology emergently and they perform cystoscopy with retrograde pyelogram and placement of stents on each side.  She had worsening hypotension and was admitted to the ICU and has been taking care of by critical care.  She was initially on broader spectrum antibiotics but since then her blood cultures and urine cultures are all growing Proteus.  She has now been narrowed to ceftriaxone.  Unless urology is planning on aggressive approach towards her stones with further procedures in the near future I would limit her treatment course to 7 days of antimicrobial therapy which she hopefully can complete with oral antibiotics as she continues to improve.    Review of Systems: Review of Systems  Unable to perform ROS: Critical illness    Past Medical History:  Diagnosis Date  . CHF (congestive heart failure) (HCC)   . CKD (chronic kidney disease)   . COPD (chronic obstructive pulmonary disease) (HCC)   . CVA (cerebral vascular accident) (HCC)   . Hypertension   . Type 2 diabetes mellitus (HCC)  Social History   Tobacco Use  . Smoking status: Former Games developer  . Smokeless tobacco: Former Engineer, water Use Topics  . Alcohol use: Not Currently  . Drug use: Not Currently    History reviewed. No pertinent family history. Allergies  Allergen Reactions  . Latex Other (See Comments)    On MAR    OBJECTIVE: Blood pressure  127/60, pulse 91, temperature 98.6 F (37 C), temperature source Axillary, resp. rate (!) 22, height 5\' 4"  (1.626 m), weight 106.6 kg, SpO2 96 %.  Physical Exam Constitutional:      General: She is not in acute distress.    Appearance: Normal appearance. She is well-developed. She is obese. She is not ill-appearing or diaphoretic.  HENT:     Head: Normocephalic and atraumatic.     Right Ear: Hearing and external ear normal.     Left Ear: Hearing and external ear normal.     Nose: No nasal deformity or rhinorrhea.  Eyes:     General: No scleral icterus.    Conjunctiva/sclera: Conjunctivae normal.     Right eye: Right conjunctiva is not injected.     Left eye: Left conjunctiva is not injected.     Pupils: Pupils are equal, round, and reactive to light.  Neck:     Vascular: No JVD.  Cardiovascular:     Rate and Rhythm: Normal rate and regular rhythm.     Heart sounds: Normal heart sounds, S1 normal and S2 normal. No murmur heard. No friction rub.  Pulmonary:     Effort: No respiratory distress.     Breath sounds: No stridor. No wheezing or rhonchi.  Abdominal:     General: Bowel sounds are normal. There is no distension.     Palpations: Abdomen is soft. There is no mass.     Tenderness: There is no abdominal tenderness.     Hernia: No hernia is present.  Musculoskeletal:        General: Normal range of motion.     Right shoulder: Normal.     Left shoulder: Normal.     Cervical back: Normal range of motion and neck supple.     Right hip: Normal.     Left hip: Normal.     Right knee: Normal.     Left knee: Normal.  Lymphadenopathy:     Head:     Right side of head: No submandibular, preauricular or posterior auricular adenopathy.     Left side of head: No submandibular, preauricular or posterior auricular adenopathy.     Cervical: No cervical adenopathy.     Right cervical: No superficial or deep cervical adenopathy.    Left cervical: No superficial or deep cervical  adenopathy.  Skin:    General: Skin is warm and dry.     Coloration: Skin is not pale.     Findings: No abrasion, bruising, ecchymosis, erythema, lesion or rash.     Nails: There is no clubbing.  Neurological:     Mental Status: She is alert.  Psychiatric:        Attention and Perception: She is attentive.        Mood and Affect: Affect is flat.        Speech: Speech is delayed.        Behavior: Behavior is cooperative.        Cognition and Memory: Cognition is impaired. Memory is impaired.     Lab Results Lab Results  Component Value Date  WBC 22.5 (H) 05/12/2020   HGB 11.3 (L) 05/12/2020   HCT 34.5 (L) 05/12/2020   MCV 94.5 05/12/2020   PLT 80 (L) 05/12/2020    Lab Results  Component Value Date   CREATININE 1.48 (H) 05/12/2020   BUN 28 (H) 05/12/2020   NA 140 05/12/2020   K 3.9 05/12/2020   CL 112 (H) 05/12/2020   CO2 22 05/12/2020    Lab Results  Component Value Date   ALT 37 05/10/2020   AST 29 05/10/2020   ALKPHOS 96 05/10/2020   BILITOT 0.5 05/10/2020     Microbiology: Recent Results (from the past 240 hour(s))  Culture, blood (Routine x 2)     Status: Abnormal (Preliminary result)   Collection Time: 05/10/20  7:20 PM   Specimen: BLOOD  Result Value Ref Range Status   Specimen Description BLOOD SITE NOT SPECIFIED  Final   Special Requests   Final    BOTTLES DRAWN AEROBIC AND ANAEROBIC Blood Culture results may not be optimal due to an inadequate volume of blood received in culture bottles   Culture  Setup Time (A)  Final    GRAM VARIABLE ROD IN BOTH AEROBIC AND ANAEROBIC BOTTLES CRITICAL RESULT CALLED TO, READ BACK BY AND VERIFIED WITH: PHARMD JESSICA M. [phone] FCP    Culture (A)  Final    PROTEUS MIRABILIS SUSCEPTIBILITIES TO FOLLOW CULTURE REINCUBATED FOR BETTER GROWTH Performed at [facility], [address] [place], [place] [id number]    Report Status PENDING  Incomplete  Blood Culture ID Panel (Reflexed)     Status: Abnormal    Collection Time: 05/10/20  7:20 PM  Result Value Ref Range Status   Enterococcus faecalis NOT DETECTED NOT DETECTED Final   Enterococcus Faecium NOT DETECTED NOT DETECTED Final   Listeria monocytogenes NOT DETECTED NOT DETECTED Final   Staphylococcus species NOT DETECTED NOT DETECTED Final   Staphylococcus aureus (BCID) NOT DETECTED NOT DETECTED Final   Staphylococcus epidermidis NOT DETECTED NOT DETECTED Final   Staphylococcus lugdunensis NOT DETECTED NOT DETECTED Final   Streptococcus species NOT DETECTED NOT DETECTED Final   Streptococcus agalactiae NOT DETECTED NOT DETECTED Final   Streptococcus pneumoniae NOT DETECTED NOT DETECTED Final   Streptococcus pyogenes NOT DETECTED NOT DETECTED Final   A.calcoaceticus-baumannii NOT DETECTED NOT DETECTED Final   Bacteroides fragilis NOT DETECTED NOT DETECTED Final   Enterobacterales DETECTED (A) NOT DETECTED Final    Comment: Enterobacterales represent a large order of gram negative bacteria, not a single organism. CRITICAL RESULT CALLED TO, READ BACK BY AND VERIFIED WITH: PHARMD JESSICA M. 1207 [phone] FCP    Enterobacter cloacae complex NOT DETECTED NOT DETECTED Final   Escherichia coli NOT DETECTED NOT DETECTED Final   Klebsiella aerogenes NOT DETECTED NOT DETECTED Final   Klebsiella oxytoca NOT DETECTED NOT DETECTED Final   Klebsiella pneumoniae NOT DETECTED NOT DETECTED Final   Proteus species DETECTED (A) NOT DETECTED Final    Comment: CRITICAL RESULT CALLED TO, READ BACK BY AND VERIFIED WITH: PHARMD JESSICA M. 1207 [phone] FCP    Salmonella species NOT DETECTED NOT DETECTED Final   Serratia marcescens NOT DETECTED NOT DETECTED Final   Haemophilus influenzae NOT DETECTED NOT DETECTED Final   Neisseria meningitidis NOT DETECTED NOT DETECTED Final   Pseudomonas aeruginosa NOT DETECTED NOT DETECTED Final   Stenotrophomonas maltophilia NOT DETECTED NOT DETECTED Final   Candida albicans NOT DETECTED NOT DETECTED Final   Candida auris  NOT DETECTED NOT DETECTED Final  Candida glabrata NOT DETECTED NOT DETECTED Final   Candida krusei NOT DETECTED NOT DETECTED Final   Candida parapsilosis NOT DETECTED NOT DETECTED Final   Candida tropicalis NOT DETECTED NOT DETECTED Final   Cryptococcus neoformans/gattii NOT DETECTED NOT DETECTED Final   CTX-M ESBL NOT DETECTED NOT DETECTED Final   Carbapenem resistance IMP NOT DETECTED NOT DETECTED Final   Carbapenem resistance KPC NOT DETECTED NOT DETECTED Final   Carbapenem resistance NDM NOT DETECTED NOT DETECTED Final   Carbapenem resist OXA 48 LIKE NOT DETECTED NOT DETECTED Final   Carbapenem resistance VIM NOT DETECTED NOT DETECTED Final    Comment: Performed at [facility], [address] [place], [place] [address]  Resp Panel by RT-PCR (Flu A&B, Covid) Nasopharyngeal Swab     Status: None   Collection Time: 05/10/20  7:45 PM   Specimen: Nasopharyngeal Swab; Nasopharyngeal(NP) swabs in vial transport medium  Result Value Ref Range Status   SARS Coronavirus 2 by RT PCR NEGATIVE NEGATIVE Final    Comment: (NOTE) SARS-CoV-2 target nucleic acids are NOT DETECTED.  The SARS-CoV-2 RNA is generally detectable in upper respiratory specimens during the acute phase of infection. The lowest concentration of SARS-CoV-2 viral copies this assay can detect is 138 copies/mL. A negative result does not preclude SARS-Cov-2 infection and should not be used as the sole basis for treatment or other patient management decisions. A negative result may occur with  improper specimen collection/handling, submission of specimen other than nasopharyngeal swab, presence of viral mutation(s) within the areas targeted by this assay, and inadequate number of viral copies(<138 copies/mL). A negative result must be combined with clinical observations, patient history, and epidemiological information. The expected result is Negative.  Fact Sheet for Patients:   BloggerCourse.com  Fact Sheet for Healthcare Providers:  SeriousBroker.it  This test is no t yet approved or cleared by the [place] FDA and  has been authorized for detection and/or diagnosis of SARS-CoV-2 by FDA under an Emergency Use Authorization (EUA). This EUA will remain  in effect (meaning this test can be used) for the duration of the COVID-19 declaration under Section 564(b)(1) of the Act, 21 U.S.C.section 360bbb-3(b)(1), unless the authorization is terminated  or revoked sooner.       Influenza A by PCR NEGATIVE NEGATIVE Final   Influenza B by PCR NEGATIVE NEGATIVE Final    Comment: (NOTE) The Xpert Xpress SARS-CoV-2/FLU/RSV plus assay is intended as an aid in the diagnosis of influenza from Nasopharyngeal swab specimens and should not be used as a sole basis for treatment. Nasal washings and aspirates are unacceptable for Xpert Xpress SARS-CoV-2/FLU/RSV testing.  Fact Sheet for Patients: BloggerCourse.com  Fact Sheet for Healthcare Providers: SeriousBroker.it  This test is not yet approved or cleared by the [place] FDA and has been authorized for detection and/or diagnosis of SARS-CoV-2 by FDA under an Emergency Use Authorization (EUA). This EUA will remain in effect (meaning this test can be used) for the duration of the COVID-19 declaration under Section 564(b)(1) of the Act, 21 U.S.C. section 360bbb-3(b)(1), unless the authorization is terminated or revoked.  Performed at [facility], [address], [place], [place] [address]   Culture, blood (Routine x 2)     Status: Abnormal (Preliminary result)   Collection Time: 05/10/20  9:06 PM   Specimen: BLOOD  Result Value Ref Range Status   Specimen Description BLOOD SITE NOT SPECIFIED  Final   Special Requests   Final    BOTTLES  DRAWN AEROBIC AND ANAEROBIC Blood Culture results may not be  optimal due to an inadequate volume of blood received in culture bottles   Culture  Setup Time (A)  Final    GRAM VARIABLE ROD IN BOTH AEROBIC AND ANAEROBIC BOTTLES CRITICAL VALUE NOTED.  VALUE IS CONSISTENT WITH PREVIOUSLY REPORTED AND CALLED VALUE. Performed at [facility], [address] [place], [place] [address]    Culture PROTEUS MIRABILIS (A)  Final   Report Status PENDING  Incomplete  Urine Culture     Status: Abnormal (Preliminary result)   Collection Time: 05/11/20  2:07 AM   Specimen: Urine, Cystoscope  Result Value Ref Range Status   Specimen Description CYSTOSCOPY URINE, CATHETERIZED  Final   Special Requests PATIENT ON FOLLOWING MAXAPIME  Final   Culture (A)  Final    >=100,000 COLONIES/mL PROTEUS MIRABILIS SUSCEPTIBILITIES TO FOLLOW Performed at [facility], [address], [place], [place] [address]    Report Status PENDING  Incomplete  MRSA PCR Screening     Status: None   Collection Time: 05/11/20  5:37 AM   Specimen: Nasal Mucosa; Nasopharyngeal  Result Value Ref Range Status   MRSA by PCR NEGATIVE NEGATIVE Final    Comment:        The GeneXpert MRSA Assay (FDA approved for NASAL specimens only), is one component of a comprehensive MRSA colonization surveillance program. It is not intended to diagnose MRSA infection nor to guide or monitor treatment for MRSA infections. Performed at [facility], [address], [place], [place] [address]     Acey Lav, MD [facility] for Infectious Disease [facility] [phone] pager  05/12/2020, 4:08 PM

## 2020-05-12 NOTE — Progress Notes (Signed)
NAME:  Julie Donovan, MRN:  [id number], DOB:  Sep 28, 1965, LOS: 1 ADMISSION DATE:  05/10/2020, CONSULTATION DATE:  05/11/20 REFERRING MD:  Dr Thomes Dinning, CHIEF COMPLAINT:  shock  History of Present Illness:  55 yo with h/o COPD, HTN, DM2, cva and residual R sided weakness, reportedly non verbal at baseline who presented with ams found to have obstructing nephrolithiasis with bilateral hydronephrosis. Pt was taken to OR for stent placement and subsequently has become hypotensive for which ccm has been consulted to take to ICU for pressor support.   Pertinent  Medical History  R sided deficits Nonverbal baseline  Significant Hospital Events: Including procedures, antibiotic start and stop dates in addition to other pertinent events   . 4/17: bilateral ureteral stents . 4/16: flagyl/vanc/cefoxitin  Interim History / Subjective:  Weaned off levophed yesterday.   She was placed on venti mask overnight due to increased upper airway secretions that were cleared via NT suctioning.  Patient reports feeling better today.  Objective   Blood pressure 95/67, pulse 94, temperature 99.3 F (37.4 C), temperature source Axillary, resp. rate 19, height 5\' 4"  (1.626 m), weight 106.6 kg, SpO2 98 %.    FiO2 (%):  [55 %] 55 %   Intake/Output Summary (Last 24 hours) at 05/12/2020 0825 Last data filed at 05/12/2020 0600 Gross per 24 hour  Intake 4340.7 ml  Output 2275 ml  Net 2065.7 ml   Filed Weights   05/10/20 2207  Weight: 106.6 kg    Examination: General: no acute distress, venti mask in place HENT: ncat, PERRL, moist mucous membranes Lungs: course breath sounds. No wheezing or rhonchi. Cardiovascular: rrr, s1s2, no murmurs Abdomen: non-distended, non-tender, BS+ Extremities: + edema in all extremities with R UE contraction Neuro: residual right side deficits, non-verbal  Labs/imaging that I have personally reviewed  (right click and "Reselect all SmartList Selections" daily)  Lactic Acid  2.6 <4.7 < 4.5  Mg 1.2 Phos 3  Cr 1.48  CT Head 4/16 Bilateral lacunar infarcts as above, chronic.  Resolved Hospital Problem list   Septic Shock  Assessment & Plan:  Sepsis 2/2 acute obstructing nephrolithiasis and bacteremia B/l obstructing nephrolithiasis Bilateral hydronephrosis - s/p stents with urology - Vasopressors have been titrated off - continue fluid blouses as needed - blood cultures showing proteus - Narrow antibiotics to ceftriaxone  Acute Kidney Injury - improving Lactic Acidosis - improving - fluid boluses as above - trend BMP  Acute Hypoxemic Respiratory Failure In setting of septic shock and dysphagia - NT suctioning for upper airway secretions - Wean O2 as able back to nasal canula  Acute Encephalopathy - improved  -2/2 sepsis -cth negative -follow mental status -non verbal at baseline   T2dm:  -monitor  H/o cva:  -chronic R deficits.   Nutrition Core trak placed today, will start TF  Best practice (right click and "Reselect all SmartList Selections" daily)  Diet:  Tube Feed  Pain/Anxiety/Delirium protocol (if indicated): No VAP protocol (if indicated): Not indicated DVT prophylaxis: SCD GI prophylaxis: N/A Glucose control:  SSI Yes Central venous access:  Yes, and it is still needed Arterial line:  N/A Foley:  Yes, and it is still needed Mobility:  bed rest  PT consulted: N/A Last date of multidisciplinary goals of care discussion [pending] Code Status:  full code Disposition: ICU, will transfer to progressive care unit  Labs   CBC: Recent Labs  Lab 05/10/20 1918 05/11/20 0149 05/11/20 0500 05/11/20 0713 05/11/20 1644 05/12/20 0500  WBC 21.0*  --  17.6* 22.5*  --  22.5*  NEUTROABS 18.6*  --   --   --   --   --   HGB 15.0 11.6* 11.8* 11.4* 11.2* 11.3*  HCT 45.1 34.0* 36.8 36.0 33.0* 34.5*  MCV 92.2  --  94.6 98.4  --  94.5  PLT 237  --  127* 110*  --  80*    Basic Metabolic Panel: Recent Labs  Lab 05/10/20 1918  05/11/20 0149 05/11/20 0500 05/11/20 0713 05/11/20 1644 05/11/20 2200 05/12/20 0500  NA 136 141 139  --  139 141 140  K 3.7 3.8 3.1*  --  4.1 4.3 3.9  CL 105  --  108  --   --  111 112*  CO2 20*  --  17*  --   --  21* 22  GLUCOSE 145*  --  129*  --   --  114* 87  BUN 32*  --  37*  --   --  32* 28*  CREATININE 2.50*  --  2.51*  --   --  1.67* 1.48*  CALCIUM 8.5*  --  7.8*  --   --  7.9* 8.0*  MG  --   --   --  1.2*  --   --   --   PHOS  --   --   --  3.0  --   --   --    GFR: Estimated Creatinine Clearance: 51.2 mL/min (A) (by C-G formula based on SCr of 1.48 mg/dL (H)). Recent Labs  Lab 05/10/20 1918 05/11/20 0500 05/11/20 0605 05/11/20 0713 05/11/20 1143 05/11/20 2200 05/12/20 0500  WBC 21.0* 17.6*  --  22.5*  --   --  22.5*  LATICACIDVEN 2.5*  --  4.5*  --  4.7* 2.6*  --     Liver Function Tests: Recent Labs  Lab 05/10/20 1918  AST 29  ALT 37  ALKPHOS 96  BILITOT 0.5  PROT 6.4*  ALBUMIN 3.1*   No results for input(s): LIPASE, AMYLASE in the last 168 hours. No results for input(s): AMMONIA in the last 168 hours.  ABG    Component Value Date/Time   PHART 7.427 05/11/2020 1644   PCO2ART 26.6 (L) 05/11/2020 1644   PO2ART 62 (L) 05/11/2020 1644   HCO3 17.4 (L) 05/11/2020 1644   TCO2 18 (L) 05/11/2020 1644   ACIDBASEDEF 5.0 (H) 05/11/2020 1644   O2SAT 92.0 05/11/2020 1644     Coagulation Profile: Recent Labs  Lab 05/10/20 1918 05/11/20 0713  INR 1.4* 1.8*    Cardiac Enzymes: No results for input(s): CKTOTAL, CKMB, CKMBINDEX, TROPONINI in the last 168 hours.  HbA1C: No results found for: HGBA1C  CBG: Recent Labs  Lab 05/11/20 1144 05/11/20 1615 05/11/20 1941 05/11/20 2321 05/12/20 0315  GLUCAP 122* 79 88 82 72    Critical care time: 35 minutes    Melody Comas, MD [facility] Office: [phone]   See Amion for personal pager PCCM on call pager [phone] until 7pm. Please call Elink 7p-7a.  [phone]

## 2020-05-12 NOTE — Procedures (Signed)
Cortrak  Person Inserting Tube:  Jamare Vanatta, RD Tube Type:  Cortrak - 43 inches Tube Location:  Right nare Initial Placement:  Stomach Secured by: Bridle Technique Used to Measure Tube Placement:  Documented cm marking at nare/ corner of mouth Cortrak Secured At:  65 cm   No x-ray is required. RN may begin using tube.   If the tube becomes dislodged please keep the tube and contact the Cortrak team at [email or web address] ([email or web address]) for replacement.  If after hours and replacement cannot be delayed, place a NG tube and confirm placement with an abdominal x-ray.    Erick Oxendine RD, LDN Clinical Nutrition Pager listed in AMION    

## 2020-05-12 NOTE — Progress Notes (Signed)
Initial Nutrition Assessment  RD working remotely.  DOCUMENTATION CODES:   Obesity unspecified  INTERVENTION:   Initiate tube feeds via Cortrak: - Start Osmolite 1.5 @ 25 ml/hr and advance by 10 ml q 8 hours to goal rate of 55 ml/hr (1320 ml/day) - ProSource TF 45 ml daily  Tube feeding regimen at goal provides 2020 kcal, 94 grams of protein, and 1006 ml of H2O.   NUTRITION DIAGNOSIS:   Inadequate oral intake related to inability to eat as evidenced by NPO status.  GOAL:   Patient will meet greater than or equal to 90% of their needs  MONITOR:   Diet advancement,Labs,Weight trends,TF tolerance,I & O's  REASON FOR ASSESSMENT:   Consult Enteral/tube feeding initiation and management  ASSESSMENT:   55 year old female who presented to the ED on 4/26 with AMS. PMH of CHF, CKD stage 4a, COPD, CVA, HTN, T2DM, nonverbal at baseline. Pt admitted with sepsis 2/2 hydronephrosis with renal and ureteral calculus obstruction.   4/17 - s/p cystoscopy with bilateral retrograde pyelogram, bilateral ureteral stent placement, and Foley placement  Pt is currently NPO. Cortrak placed this morning, tip gastric. Consult received for tube feeding intiation and management.  Unable to obtain diet and weight history at this time. No weight history available in chart.  Pt with mild pitting generalized edema and mild pitting edema to BLE per nursing edema assessment.  Medications reviewed and include: IV abx, keppra IVF: D5 @ 50 ml/hr  Labs reviewed: BUN 28, creatinine 1.48, lactic acid 2.6, ionized calcium 1.11 CBG's: 72-122 x 24 hours  UOP: 2275 ml x 24 hours I/O's: 4.1 L since admit  NUTRITION - FOCUSED PHYSICAL EXAM:  Unable to complete at this time. RD working remotely.  Diet Order:   Diet Order            Diet NPO time specified  Diet effective now                 EDUCATION NEEDS:   No education needs have been identified at this time  Skin:  Skin Assessment: Skin  Integrity Issues: Incisions: perineum  Last BM:  no documented BM  Height:   Ht Readings from Last 1 Encounters:  05/10/20 5\' 4"  (1.626 m)    Weight:   Wt Readings from Last 1 Encounters:  05/10/20 106.6 kg    BMI:  Body mass index is 40.34 kg/m.  Estimated Nutritional Needs:   Kcal:  1850-2050  Protein:  90-110 grams  Fluid:  1.8-2.0 L    05/12/20, MS, RD, LDN Inpatient Clinical Dietitian Please see AMiON for contact information.

## 2020-05-13 ENCOUNTER — Inpatient Hospital Stay (HOSPITAL_COMMUNITY): Payer: Medicaid Other

## 2020-05-13 DIAGNOSIS — E8809 Other disorders of plasma-protein metabolism, not elsewhere classified: Secondary | ICD-10-CM | POA: Diagnosis not present

## 2020-05-13 DIAGNOSIS — E872 Acidosis: Secondary | ICD-10-CM | POA: Diagnosis not present

## 2020-05-13 DIAGNOSIS — I313 Pericardial effusion (noninflammatory): Secondary | ICD-10-CM | POA: Diagnosis not present

## 2020-05-13 DIAGNOSIS — A419 Sepsis, unspecified organism: Secondary | ICD-10-CM | POA: Diagnosis not present

## 2020-05-13 DIAGNOSIS — N132 Hydronephrosis with renal and ureteral calculous obstruction: Secondary | ICD-10-CM | POA: Diagnosis not present

## 2020-05-13 DIAGNOSIS — R739 Hyperglycemia, unspecified: Secondary | ICD-10-CM | POA: Diagnosis not present

## 2020-05-13 DIAGNOSIS — R7881 Bacteremia: Secondary | ICD-10-CM | POA: Diagnosis not present

## 2020-05-13 LAB — ECHOCARDIOGRAM COMPLETE BUBBLE STUDY
Area-P 1/2: 5.27 cm2
Calc EF: 56.4 %
S' Lateral: 2.8 cm
Single Plane A2C EF: 51.7 %
Single Plane A4C EF: 62.4 %

## 2020-05-13 LAB — URINE CULTURE: Culture: >=100000 — AB

## 2020-05-13 LAB — CULTURE, BLOOD (ROUTINE X 2)

## 2020-05-13 LAB — CBC
HCT: 33.6 % — ABNORMAL LOW (ref 36.0–46.0)
Hemoglobin: 11.2 g/dL — ABNORMAL LOW (ref 12.0–15.0)
MCH: 30.7 pg (ref 26.0–34.0)
MCHC: 33.3 g/dL (ref 30.0–36.0)
MCV: 92.1 fL (ref 80.0–100.0)
Platelets: 79 10*3/uL — ABNORMAL LOW (ref 150–400)
RBC: 3.65 MIL/uL — ABNORMAL LOW (ref 3.87–5.11)
RDW: 13.7 % (ref 11.5–15.5)
WBC: 23.2 10*3/uL — ABNORMAL HIGH (ref 4.0–10.5)
nRBC: 0 % (ref 0.0–0.2)

## 2020-05-13 LAB — BASIC METABOLIC PANEL
Anion gap: 8 (ref 5–15)
BUN: 24 mg/dL — ABNORMAL HIGH (ref 6–20)
CO2: 24 mmol/L (ref 22–32)
Calcium: 8.2 mg/dL — ABNORMAL LOW (ref 8.9–10.3)
Chloride: 109 mmol/L (ref 98–111)
Creatinine, Ser: 1.16 mg/dL — ABNORMAL HIGH (ref 0.44–1.00)
GFR, Estimated: 56 mL/min — ABNORMAL LOW (ref >60)
Glucose, Bld: 176 mg/dL — ABNORMAL HIGH (ref 70–99)
Potassium: 3.7 mmol/L (ref 3.5–5.1)
Sodium: 141 mmol/L (ref 135–145)

## 2020-05-13 LAB — GLUCOSE, CAPILLARY
Glucose-Capillary: 151 mg/dL — ABNORMAL HIGH (ref 70–99)
Glucose-Capillary: 172 mg/dL — ABNORMAL HIGH (ref 70–99)
Glucose-Capillary: 223 mg/dL — ABNORMAL HIGH (ref 70–99)
Glucose-Capillary: 242 mg/dL — ABNORMAL HIGH (ref 70–99)
Glucose-Capillary: 248 mg/dL — ABNORMAL HIGH (ref 70–99)
Glucose-Capillary: 241 mg/dL — ABNORMAL HIGH (ref 70–99)

## 2020-05-13 LAB — LACTIC ACID, PLASMA: Lactic Acid, Venous: 2.2 mmol/L (ref 0.5–1.9)

## 2020-05-13 LAB — HEPATIC FUNCTION PANEL
ALT: 29 U/L (ref 0–44)
AST: 34 U/L (ref 15–41)
Albumin: 2.3 g/dL — ABNORMAL LOW (ref 3.5–5.0)
Alkaline Phosphatase: 161 U/L — ABNORMAL HIGH (ref 38–126)
Bilirubin, Direct: 0.2 mg/dL (ref 0.0–0.2)
Indirect Bilirubin: 0.3 mg/dL (ref 0.3–0.9)
Total Bilirubin: 0.5 mg/dL (ref 0.3–1.2)
Total Protein: 5.3 g/dL — ABNORMAL LOW (ref 6.5–8.1)

## 2020-05-13 LAB — CK: Total CK: 155 U/L (ref 38–234)

## 2020-05-13 LAB — PHOSPHORUS
Phosphorus: <1 mg/dL — CL (ref 2.5–4.6)
Phosphorus: 2.4 mg/dL — ABNORMAL LOW (ref 2.5–4.6)

## 2020-05-13 LAB — BRAIN NATRIURETIC PEPTIDE: B Natriuretic Peptide: 495.3 pg/mL — ABNORMAL HIGH (ref 0.0–100.0)

## 2020-05-13 LAB — MAGNESIUM
Magnesium: 1.6 mg/dL — ABNORMAL LOW (ref 1.7–2.4)
Magnesium: 1.7 mg/dL (ref 1.7–2.4)

## 2020-05-13 MED ORDER — POTASSIUM PHOSPHATES 15 MMOLE/5ML IV SOLN
45.0000 mmol | Freq: Once | INTRAVENOUS | Status: AC
  Administered 2020-05-13: 45 mmol via INTRAVENOUS
  Filled 2020-05-13: qty 15

## 2020-05-13 MED ORDER — SODIUM CHLORIDE 0.9 % IV SOLN
2.0000 g | INTRAVENOUS | Status: DC
  Administered 2020-05-14 – 2020-05-15 (×6): 2 g via INTRAVENOUS
  Filled 2020-05-13 (×3): qty 2000
  Filled 2020-05-13: qty 2
  Filled 2020-05-13 (×5): qty 2000

## 2020-05-13 MED ORDER — MELATONIN 3 MG PO TABS
3.0000 mg | ORAL_TABLET | Freq: Every evening | ORAL | Status: DC | PRN
  Administered 2020-05-13: 3 mg via ORAL
  Filled 2020-05-13: qty 1

## 2020-05-13 MED ORDER — LACTATED RINGERS IV SOLN: INTRAVENOUS | Status: AC

## 2020-05-13 MED ORDER — ATORVASTATIN CALCIUM 40 MG PO TABS
40.0000 mg | ORAL_TABLET | Freq: Every day | ORAL | Status: DC
  Administered 2020-05-14 – 2020-05-15 (×2): 40 mg via ORAL
  Filled 2020-05-13 (×2): qty 1

## 2020-05-13 MED ORDER — POTASSIUM PHOSPHATES 15 MMOLE/5ML IV SOLN
20.0000 mmol | Freq: Once | INTRAVENOUS | Status: DC
  Filled 2020-05-13: qty 6.67

## 2020-05-13 MED ORDER — MAGNESIUM SULFATE 2 GM/50ML IV SOLN
2.0000 g | Freq: Once | INTRAVENOUS | Status: AC
  Administered 2020-05-13: 2 g via INTRAVENOUS
  Filled 2020-05-13: qty 50

## 2020-05-13 NOTE — Progress Notes (Addendum)
PROGRESS NOTE    Ersa Delaney  ZOX:[id number] DOB: 1965/10/30 DOA: 05/10/2020 PCP: Karna Dupes, MD   Chief Complaint  Patient presents with  . Altered Mental Status     Brief Narrative: 55 year old female with a PMH of Dementia who is nonverbal at baseline, Hypertension, Diabetes Mellitus Type 2, Left CVA with right sided deficits, Diastolic Heart Failure, CKD3, and Morbid Obesity (BMI 40 kg/m2) who presented to the ED on 4/17 with altered mental status who quickly became unstable. She was admitted to the ICU and managed for Acute Encephalopathy, Proteus Bacteremia secondary to UTI found to have bilateral hydronephrosis s/p cystoscopy and bilateral ureteral stent placement  Events: 4/17 Admitted to ICU for septic shock.  Bilateral Ureteral Stents placed. 4/17 Levophed was discontinued. 4/18 Transferred out of ICU  Subjective: Patient is somnolent. Responds to painful stimuli in all four extremities. Does not follow commands. She is nonverbal at baseline. Per family, patient is more out of it than she was yesterday. Also appears slightly on the dry side. There is no JVD on exam.   Assessment & Plan: Active Problems:   Sepsis (HCC)   Hydronephrosis with renal and ureteral calculus obstruction   Leukocytosis   Lactic acidosis   Hypoalbuminemia   Hyperglycemia   Acute kidney injury superimposed on CKD (HCC)   S/P cystoscopy with ureteral stent placement   Hydronephrosis with obstructing calculus  Acute Toxic Metabolic Encephalopathy - Acute Infection vs Neuro: - Continue antibiotics. - Patient is on Keppra but I do not have a history of seizures documented. - Check EEG. - Check MRI of brain in am.  Proteus Bacteremia secondary to Obstructive Uropathy s/p Bilateral  Ureteral Stent Placement: Cultures were sensitive to Cephalosporins. - On IV Ceftriaxone 1g QD.  This is Day 3 of antibiotics post stent placement. - Follow up on blood culture results. - Change  antibiotics to IV Ampicillin based on urine culture results, and de-escalate to PO Amoxicillin based on susceptibilties.  This will be continued until 5/10, when she will have laser lithotripsy.   - Infectious Disease and Urology are following, appreciate assistance and recommendations.  Leukocytosis: - WBC remains elevated at 23K. - 4/18 repeat Blood Cultures are pending. - Check urine, sputum, liver profile, and CXR.  Mild Lactic Acidosis: Lactate is 2.2 mmol/L.  HR is ~ 110 beats/min.  BNP is 495 pg/mL.  EF is normal.   - Start LR 125 CC/hr (106 kg) and reassess tomorrow. - Monitor peripheral edema.  History of CVA with right sided weakness and aphasia: Patient is nonverbal at baseline. - Perform Neurochecks q4h. - Hold off on antiplatelets until thrombocytopenia improves. - Start Atorvastatin 40 mg nightly.  Moderate Pericardial Effusion: - Check Echo.  Hypokalemia: - K 3.7 mmol/L, replaced.  Hypomagnesium: - Mg 1.6 mg/dL, replaced.  Hypophosphatemia: - Ph <1 mg/dL, replaced.  Acute Kidney Injury: - Creatinine improved from 1.9 to 1.1 mg/dL. - Continue fluids and repeat BMP in am.  Acute Thrombocytopenia: - Platelets decreased from 237 to 79K over the last 5 days. - Check HIT panel and hold Heparin.  Diabetes Mellitus Type 2: - Glucose was running low on 4/18 and patient was placed on Dextrose infusion. - Glucose is now stable. - Continue Lispro SSi with POC glucose q6h.  Chronic COPD, not in acute exacberbation: - Lung exam is stable. - Continue nebulizers PRN.  Essential Hypertension: - BP is stable. - Start low dose Lopressor tachycardia.  Moderate Protein Calore Malnutrition: - Albumin is  2.3 g/dL.  Morbid Obesity: BMI 40 kg/m2  Lines: Left IJV placed 4/17. Foley: Foley placed 4/17. Nutrition: Tube Feeds are running since 4/17.   Diet Order            Diet NPO time specified  Diet effective now                 Nutrition Problem: Inadequate  oral intake Etiology: inability to eat Signs/Symptoms: NPO status Interventions: Tube feeding,Prostat Patient's Body mass index is 40.34 kg/m.   DVT prophylaxis: heparin injection 5,000 Units Start: 05/12/20 1430 Place and maintain sequential compression device Start: 05/12/20 1117 SCDs Start: 05/11/20 0713 Code Status:   Code Status: Full Code  Family Communication: plan of care discussed with patient at bedside.  Status is: Inpatient  Remains inpatient appropriate because:Inpatient level of care appropriate due to severity of illness   Dispo: The patient is from: Home              Anticipated d/c is to: SNF              Patient currently is not medically stable to d/c.   Difficult to place patient No    Unresulted Labs (From admission, onward)          Start     Ordered   05/13/20 0715  Hepatic function panel  Add-on,   AD       Question:  Specimen collection method  Answer:  Unit=Unit collect   05/13/20 0714   05/13/20 0709  Heparin induced platelet Ab (HIT antibody)  Once,   R       Question:  Specimen collection method  Answer:  Unit=Unit collect   05/13/20 0708   05/13/20 0707  Lactic acid, plasma  Once,   R       Question:  Specimen collection method  Answer:  Unit=Unit collect   05/13/20 0707   05/13/20 0707  CK  Once,   R       Question:  Specimen collection method  Answer:  Unit=Unit collect   05/13/20 0707   05/13/20 0707  Brain natriuretic peptide  Once,   R       Question:  Specimen collection method  Answer:  Unit=Unit collect   05/13/20 0707   05/12/20 1700  Magnesium  (ICU Tube Feeding: PEPuP )  5A & 5P,   R (with TIMED occurrences)      05/12/20 1107   05/12/20 1700  Phosphorus  (ICU Tube Feeding: PEPuP )  5A & 5P,   R (with TIMED occurrences)      05/12/20 1107   05/12/20 0500  Basic metabolic panel  Daily,   R      05/11/20 1553   05/12/20 0500  CBC  Daily,   R      05/11/20 1553   05/11/20 0354  CBC  Daily,   R      05/11/20 0355   Signed and  Held  Comprehensive metabolic panel  Tomorrow morning,   R        Signed and Held          Medications reviewed:  Scheduled Meds: . Chlorhexidine Gluconate Cloth  6 each Topical Daily  . feeding supplement (PROSource TF)  45 mL Per Tube Daily  . heparin injection (subcutaneous)  5,000 Units Subcutaneous Q8H  . sodium chloride flush  10-40 mL Intracatheter Q12H   Continuous Infusions: . cefTRIAXone (ROCEPHIN)  IV Stopped (05/12/20 1456)  .  feeding supplement (OSMOLITE 1.5 CAL) 45 mL/hr at 05/13/20 0730  . levETIRAcetam Stopped (05/13/20 0542)  . magnesium sulfate bolus IVPB 2 g (05/13/20 0752)  . norepinephrine (LEVOPHED) Adult infusion Stopped (05/11/20 1039)  . potassium PHOSPHATE IVPB (in mmol)      Consultants:see note  Procedures:see note  Antimicrobials: Anti-infectives (From admission, onward)   Start     Dose/Rate Route Frequency Ordered Stop   05/12/20 1500  cefTRIAXone (ROCEPHIN) 2 g in sodium chloride 0.9 % 100 mL IVPB        2 g 200 mL/hr over 30 Minutes Intravenous Every 24 hours 05/12/20 1042     05/11/20 2100  ceFEPIme (MAXIPIME) 2 g in sodium chloride 0.9 % 100 mL IVPB  Status:  Discontinued        2 g 200 mL/hr over 30 Minutes Intravenous Every 24 hours 05/10/20 2046 05/11/20 1425   05/11/20 1515  ceFEPIme (MAXIPIME) 2 g in sodium chloride 0.9 % 100 mL IVPB  Status:  Discontinued        2 g 200 mL/hr over 30 Minutes Intravenous Every 12 hours 05/11/20 1425 05/12/20 1042   05/11/20 0200  cefOXitin (MEFOXIN) 2 g in sodium chloride 0.9 % 100 mL IVPB        2 g 200 mL/hr over 30 Minutes Intravenous  Once 05/11/20 0150 05/11/20 0232   05/10/20 2100  vancomycin (VANCOREADY) IVPB 2000 mg/400 mL        2,000 mg 200 mL/hr over 120 Minutes Intravenous  Once 05/10/20 2046 05/11/20 0011   05/10/20 2045  vancomycin variable dose per unstable renal function (pharmacist dosing)  Status:  Discontinued         Does not apply See admin instructions 05/10/20 2046 05/12/20  1042   05/10/20 1930  ceFEPIme (MAXIPIME) 2 g in sodium chloride 0.9 % 100 mL IVPB  Status:  Discontinued        2 g 200 mL/hr over 30 Minutes Intravenous  Once 05/10/20 1921 05/12/20 1042   05/10/20 1930  metroNIDAZOLE (FLAGYL) IVPB 500 mg        500 mg 100 mL/hr over 60 Minutes Intravenous  Once 05/10/20 1921 05/10/20 2111   05/10/20 1930  vancomycin (VANCOREADY) IVPB 1000 mg/200 mL  Status:  Discontinued        1,000 mg 200 mL/hr over 60 Minutes Intravenous  Once 05/10/20 1921 05/10/20 2046     Culture/Microbiology    Component Value Date/Time   SDES BLOOD LEFT ANTECUBITAL 05/12/2020 1055   SPECREQUEST  05/12/2020 1055    BOTTLES DRAWN AEROBIC AND ANAEROBIC Blood Culture adequate volume   CULT  05/12/2020 1055    NO GROWTH < 24 HOURS Performed at [facility], [address] [place], [place] [address]    REPTSTATUS PENDING 05/12/2020 1055    Other culture-see note  Objective: Vitals: Today's Vitals   05/13/20 0400 05/13/20 0500 05/13/20 0600 05/13/20 0700  BP: 123/69 136/76 132/73 136/76  Pulse: (!) 109 (!) 110 (!) 108 (!) 105  Resp: (!) 35 (!) 31 (!) 27 (!) 27  Temp:  99.3 F (37.4 C)    TempSrc:      SpO2: 92% 92% 91% 90%  Weight:      Height:      PainSc:        Intake/Output Summary (Last 24 hours) at 05/13/2020 0805 Last data filed at 05/13/2020 0600 Gross per 24 hour  Intake 1359.89 ml  Output 1255 ml  Net 104.89  ml   Filed Weights   05/10/20 2207  Weight: 106.6 kg   Weight change:   Intake/Output from previous day: 04/18 0701 - 04/19 0700 In: 1359.9 [I.V.:553.2; NG/GT:506.7; IV Piggyback:300] Out: 1555 [Urine:1525; Emesis/NG output:30] Intake/Output this shift: No intake/output data recorded. Filed Weights   05/10/20 2207  Weight: 106.6 kg    Examination: General exam: chronically ill appearing, obese, somnolent, opens eyes to stimuli, does not follow commands, nonverbal at baseline HEENT: NCAT, PERRL Respiratory system: CTAB, no  WRR Cardiovascular system: did not appreciate a murmur, mild sinus tachycardia, No JVD. Gastrointestinal system: Large body habitus, Abdomen soft, NT,ND, BS+. Nervous System: Chronic right sided deficits. Extremities:   (+) edema, distal peripheral pulses palpable.  Skin: No rashes,no icterus. MSK: Physical deconditioning  Data Reviewed: I have personally reviewed following labs and imaging studies CBC: Recent Labs  Lab 05/10/20 1918 05/11/20 0149 05/11/20 0500 05/11/20 0713 05/11/20 1644 05/12/20 0500 05/13/20 0500  WBC 21.0*  --  17.6* 22.5*  --  22.5* 23.2*  NEUTROABS 18.6*  --   --   --   --   --   --   HGB 15.0   < > 11.8* 11.4* 11.2* 11.3* 11.2*  HCT 45.1   < > 36.8 36.0 33.0* 34.5* 33.6*  MCV 92.2  --  94.6 98.4  --  94.5 92.1  PLT 237  --  127* 110*  --  80* 79*   < > = values in this interval not displayed.   Basic Metabolic Panel: Recent Labs  Lab 05/10/20 1918 05/11/20 0149 05/11/20 0500 05/11/20 0713 05/11/20 1644 05/11/20 2200 05/12/20 0500 05/12/20 2050 05/13/20 0500  NA 136   < > 139  --  139 141 140  --  141  K 3.7   < > 3.1*  --  4.1 4.3 3.9  --  3.7  CL 105  --  108  --   --  111 112*  --  109  CO2 20*  --  17*  --   --  21* 22  --  24  GLUCOSE 145*  --  129*  --   --  114* 87  --  176*  BUN 32*  --  37*  --   --  32* 28*  --  24*  CREATININE 2.50*  --  2.51*  --   --  1.67* 1.48*  --  1.16*  CALCIUM 8.5*  --  7.8*  --   --  7.9* 8.0*  --  8.2*  MG  --   --   --  1.2*  --   --   --  1.6* 1.6*  PHOS  --   --   --  3.0  --   --   --  1.1* <1.0*   < > = values in this interval not displayed.   Liver Function Tests: Recent Labs  Lab 05/10/20 1918  AST 29  ALT 37  ALKPHOS 96  BILITOT 0.5  PROT 6.4*  ALBUMIN 3.1*   Coagulation Profile: Recent Labs  Lab 05/10/20 1918 05/11/20 0713  INR 1.4* 1.8*   CBG: Recent Labs  Lab 05/12/20 1522 05/12/20 2039 05/12/20 2316 05/13/20 0302 05/13/20 0742  GLUCAP 107* 143* 159* 151* 172*    Sepsis Labs: Recent Labs  Lab 05/10/20 1918 05/11/20 0605 05/11/20 1143 05/11/20 2200  LATICACIDVEN 2.5* 4.5* 4.7* 2.6*    Recent Results (from the past 240 hour(s))  Culture, blood (Routine x 2)  Status: Abnormal (Preliminary result)   Collection Time: 05/10/20  7:20 PM   Specimen: BLOOD  Result Value Ref Range Status   Specimen Description BLOOD SITE NOT SPECIFIED  Final   Special Requests   Final    BOTTLES DRAWN AEROBIC AND ANAEROBIC Blood Culture results may not be optimal due to an inadequate volume of blood received in culture bottles   Culture  Setup Time (A)  Final    GRAM VARIABLE ROD IN BOTH AEROBIC AND ANAEROBIC BOTTLES CRITICAL RESULT CALLED TO, READ BACK BY AND VERIFIED WITH: PHARMD JESSICA M. [id number] FCP    Culture (A)  Final    PROTEUS MIRABILIS SUSCEPTIBILITIES TO FOLLOW CULTURE REINCUBATED FOR BETTER GROWTH Performed at [facility], [address], [place], [place] [address]    Report Status PENDING  Incomplete  Blood Culture ID Panel (Reflexed)     Status: Abnormal   Collection Time: 05/10/20  7:20 PM  Result Value Ref Range Status   Enterococcus faecalis NOT DETECTED NOT DETECTED Final   Enterococcus Faecium NOT DETECTED NOT DETECTED Final   Listeria monocytogenes NOT DETECTED NOT DETECTED Final   Staphylococcus species NOT DETECTED NOT DETECTED Final   Staphylococcus aureus (BCID) NOT DETECTED NOT DETECTED Final   Staphylococcus epidermidis NOT DETECTED NOT DETECTED Final   Staphylococcus lugdunensis NOT DETECTED NOT DETECTED Final   Streptococcus species NOT DETECTED NOT DETECTED Final   Streptococcus agalactiae NOT DETECTED NOT DETECTED Final   Streptococcus pneumoniae NOT DETECTED NOT DETECTED Final   Streptococcus pyogenes NOT DETECTED NOT DETECTED Final   A.calcoaceticus-baumannii NOT DETECTED NOT DETECTED Final   Bacteroides fragilis NOT DETECTED NOT DETECTED Final   Enterobacterales DETECTED (A) NOT DETECTED Final     Comment: Enterobacterales represent a large order of gram negative bacteria, not a single organism. CRITICAL RESULT CALLED TO, READ BACK BY AND VERIFIED WITH: PHARMD JESSICA M. [phone] FCP    Enterobacter cloacae complex NOT DETECTED NOT DETECTED Final   Escherichia coli NOT DETECTED NOT DETECTED Final   Klebsiella aerogenes NOT DETECTED NOT DETECTED Final   Klebsiella oxytoca NOT DETECTED NOT DETECTED Final   Klebsiella pneumoniae NOT DETECTED NOT DETECTED Final   Proteus species DETECTED (A) NOT DETECTED Final    Comment: CRITICAL RESULT CALLED TO, READ BACK BY AND VERIFIED WITH: PHARMD JESSICA M. [phone] FCP    Salmonella species NOT DETECTED NOT DETECTED Final   Serratia marcescens NOT DETECTED NOT DETECTED Final   Haemophilus influenzae NOT DETECTED NOT DETECTED Final   Neisseria meningitidis NOT DETECTED NOT DETECTED Final   Pseudomonas aeruginosa NOT DETECTED NOT DETECTED Final   Stenotrophomonas maltophilia NOT DETECTED NOT DETECTED Final   Candida albicans NOT DETECTED NOT DETECTED Final   Candida auris NOT DETECTED NOT DETECTED Final   Candida glabrata NOT DETECTED NOT DETECTED Final   Candida krusei NOT DETECTED NOT DETECTED Final   Candida parapsilosis NOT DETECTED NOT DETECTED Final   Candida tropicalis NOT DETECTED NOT DETECTED Final   Cryptococcus neoformans/gattii NOT DETECTED NOT DETECTED Final   CTX-M ESBL NOT DETECTED NOT DETECTED Final   Carbapenem resistance IMP NOT DETECTED NOT DETECTED Final   Carbapenem resistance KPC NOT DETECTED NOT DETECTED Final   Carbapenem resistance NDM NOT DETECTED NOT DETECTED Final   Carbapenem resist OXA 48 LIKE NOT DETECTED NOT DETECTED Final   Carbapenem resistance VIM NOT DETECTED NOT DETECTED Final    Comment: Performed at [facility], [address], [place], [place] [address]  Resp Panel by RT-PCR (Flu A&B, Covid) Nasopharyngeal Swab     Status: None   Collection Time: 05/10/20  7:45 PM   Specimen:  Nasopharyngeal Swab; Nasopharyngeal(NP) swabs in vial transport medium  Result Value Ref Range Status   SARS Coronavirus 2 by RT PCR NEGATIVE NEGATIVE Final    Comment: (NOTE) SARS-CoV-2 target nucleic acids are NOT DETECTED.  The SARS-CoV-2 RNA is generally detectable in upper respiratory specimens during the acute phase of infection. The lowest concentration of SARS-CoV-2 viral copies this assay can detect is 138 copies/mL. A negative result does not preclude SARS-Cov-2 infection and should not be used as the sole basis for treatment or other patient management decisions. A negative result may occur with  improper specimen collection/handling, submission of specimen other than nasopharyngeal swab, presence of viral mutation(s) within the areas targeted by this assay, and inadequate number of viral copies(<138 copies/mL). A negative result must be combined with clinical observations, patient history, and epidemiological information. The expected result is Negative.  Fact Sheet for Patients:  BloggerCourse.com  Fact Sheet for Healthcare Providers:  SeriousBroker.it  This test is no t yet approved or cleared by the [place] FDA and  has been authorized for detection and/or diagnosis of SARS-CoV-2 by FDA under an Emergency Use Authorization (EUA). This EUA will remain  in effect (meaning this test can be used) for the duration of the COVID-19 declaration under Section 564(b)(1) of the Act, 21 U.S.C.section 360bbb-3(b)(1), unless the authorization is terminated  or revoked sooner.       Influenza A by PCR NEGATIVE NEGATIVE Final   Influenza B by PCR NEGATIVE NEGATIVE Final    Comment: (NOTE) The Xpert Xpress SARS-CoV-2/FLU/RSV plus assay is intended as an aid in the diagnosis of influenza from Nasopharyngeal swab specimens and should not be used as a sole basis for treatment. Nasal washings and aspirates are unacceptable for  Xpert Xpress SARS-CoV-2/FLU/RSV testing.  Fact Sheet for Patients: BloggerCourse.com  Fact Sheet for Healthcare Providers: SeriousBroker.it  This test is not yet approved or cleared by the [place] FDA and has been authorized for detection and/or diagnosis of SARS-CoV-2 by FDA under an Emergency Use Authorization (EUA). This EUA will remain in effect (meaning this test can be used) for the duration of the COVID-19 declaration under Section 564(b)(1) of the Act, 21 U.S.C. section 360bbb-3(b)(1), unless the authorization is terminated or revoked.  Performed at [facility], [address], [place], [place] [address]   Culture, blood (Routine x 2)     Status: Abnormal (Preliminary result)   Collection Time: 05/10/20  9:06 PM   Specimen: BLOOD  Result Value Ref Range Status   Specimen Description BLOOD SITE NOT SPECIFIED  Final   Special Requests   Final    BOTTLES DRAWN AEROBIC AND ANAEROBIC Blood Culture results may not be optimal due to an inadequate volume of blood received in culture bottles   Culture  Setup Time (A)  Final    GRAM VARIABLE ROD IN BOTH AEROBIC AND ANAEROBIC BOTTLES CRITICAL VALUE NOTED.  VALUE IS CONSISTENT WITH PREVIOUSLY REPORTED AND CALLED VALUE. Performed at [facility], [address], [place], [place] [address]    Culture PROTEUS MIRABILIS (A)  Final   Report Status PENDING  Incomplete  Urine Culture     Status: Abnormal (Preliminary result)   Collection Time: 05/11/20  2:07 AM   Specimen: Urine, Cystoscope  Result Value Ref Range Status   Specimen Description CYSTOSCOPY URINE,  CATHETERIZED  Final   Special Requests PATIENT ON FOLLOWING MAXAPIME  Final   Culture (A)  Final    >=100,000 COLONIES/mL PROTEUS MIRABILIS SUSCEPTIBILITIES TO FOLLOW Performed at [facility], [address] [place], [place] [address]    Report Status PENDING  Incomplete  MRSA PCR Screening      Status: None   Collection Time: 05/11/20  5:37 AM   Specimen: Nasal Mucosa; Nasopharyngeal  Result Value Ref Range Status   MRSA by PCR NEGATIVE NEGATIVE Final    Comment:        The GeneXpert MRSA Assay (FDA approved for NASAL specimens only), is one component of a comprehensive MRSA colonization surveillance program. It is not intended to diagnose MRSA infection nor to guide or monitor treatment for MRSA infections. Performed at [facility], [address], [place], [place] [address]   Culture, blood (routine x 2)     Status: None (Preliminary result)   Collection Time: 05/12/20 10:50 AM   Specimen: BLOOD  Result Value Ref Range Status   Specimen Description BLOOD LEFT ANTECUBITAL  Final   Special Requests   Final    BOTTLES DRAWN AEROBIC AND ANAEROBIC Blood Culture adequate volume   Culture   Final    NO GROWTH < 24 HOURS Performed at [facility], [address], [place], [place] [address]    Report Status PENDING  Incomplete  Culture, blood (routine x 2)     Status: None (Preliminary result)   Collection Time: 05/12/20 10:55 AM   Specimen: BLOOD  Result Value Ref Range Status   Specimen Description BLOOD LEFT ANTECUBITAL  Final   Special Requests   Final    BOTTLES DRAWN AEROBIC AND ANAEROBIC Blood Culture adequate volume   Culture   Final    NO GROWTH < 24 HOURS Performed at [facility], [address] [place], [place] [address]    Report Status PENDING  Incomplete     Radiology Studies: No results found.   LOS: 2 days   Baldwin Jamaica, MD [facility]  05/13/2020, 8:05 AM

## 2020-05-13 NOTE — Progress Notes (Signed)
I'm tentatively scheduling patient for bilateral ureteroscopy and laser lithotripsy Jun 03, 2020. She does not need to stay in hospital. As far as antibiotics, I would recommend following Dr. Zenaida Niece Dam's recommendations for abx and duration and then keep her on a nightly (one dose) of the same abx through Jun 06, 2020. This will keep urine sterile with the stents as these patients are highly likely to get a repeat UTI with the stents in place and show up for the ureteroscopy with fever or get septic after the case (the ureteroscopes run pressurized water for visualization which stirs up any bacteria in the system). Thanks so much for your excellent care. Please secure chat me with any questions.

## 2020-05-13 NOTE — Progress Notes (Signed)
EEG Complete, Results pending

## 2020-05-13 NOTE — Progress Notes (Signed)
  Echocardiogram 2D Echocardiogram has been performed.  Janalyn Harder 05/13/2020, 11:33 AM

## 2020-05-13 NOTE — Progress Notes (Signed)
Patient transferred to room 5W20 with belongings. VSS at this time; Report given to Elisa.

## 2020-05-13 NOTE — Progress Notes (Signed)
CSW notes patients arrived to the hospital from [facility]. Will continue to follow.  Mysti Haley LCSW

## 2020-05-13 NOTE — Progress Notes (Addendum)
Patient transferred to 5W20 from 59M. Received report from Selena Batten, Charity fundraiser. Patient A&O to person and place, placed on tele, and skin assessed with two RNs. Moisture noted under breasts and small abrasion to left eyebrow. Will continue to monitor and treat per MD orders.

## 2020-05-13 NOTE — Progress Notes (Signed)
eLink Physician-Brief Progress Note Patient Name: Julie Donovan DOB: September 18, 1965 MRN: [id number]   Date of Service  05/13/2020  HPI/Events of Note  Received request for melatonin and discontinuation of D50 infusion Glucose stable now on TF Patient seen with eyes closed but somewhat restless  eICU Interventions  Melatonin prn ordered Discontinued D50 drip     Intervention Category Intermediate Interventions: Hyperglycemia - evaluation and treatment Minor Interventions: Routine modifications to care plan (e.g. PRN medications for pain, fever)  Julie Donovan Julie Donovan 05/13/2020, 1:34 AM

## 2020-05-13 NOTE — Progress Notes (Addendum)
Subjective: She asked for water  Antibiotics:  Anti-infectives (From admission, onward)   Start     Dose/Rate Route Frequency Ordered Stop   05/12/20 1500  cefTRIAXone (ROCEPHIN) 2 g in sodium chloride 0.9 % 100 mL IVPB        2 g 200 mL/hr over 30 Minutes Intravenous Every 24 hours 05/12/20 1042     05/11/20 2100  ceFEPIme (MAXIPIME) 2 g in sodium chloride 0.9 % 100 mL IVPB  Status:  Discontinued        2 g 200 mL/hr over 30 Minutes Intravenous Every 24 hours 05/10/20 2046 05/11/20 1425   05/11/20 1515  ceFEPIme (MAXIPIME) 2 g in sodium chloride 0.9 % 100 mL IVPB  Status:  Discontinued        2 g 200 mL/hr over 30 Minutes Intravenous Every 12 hours 05/11/20 1425 05/12/20 1042   05/11/20 0200  cefOXitin (MEFOXIN) 2 g in sodium chloride 0.9 % 100 mL IVPB        2 g 200 mL/hr over 30 Minutes Intravenous  Once 05/11/20 0150 05/11/20 0232   05/10/20 2100  vancomycin (VANCOREADY) IVPB 2000 mg/400 mL        2,000 mg 200 mL/hr over 120 Minutes Intravenous  Once 05/10/20 2046 05/11/20 0011   05/10/20 2045  vancomycin variable dose per unstable renal function (pharmacist dosing)  Status:  Discontinued         Does not apply See admin instructions 05/10/20 2046 05/12/20 1042   05/10/20 1930  ceFEPIme (MAXIPIME) 2 g in sodium chloride 0.9 % 100 mL IVPB  Status:  Discontinued        2 g 200 mL/hr over 30 Minutes Intravenous  Once 05/10/20 1921 05/12/20 1042   05/10/20 1930  metroNIDAZOLE (FLAGYL) IVPB 500 mg        500 mg 100 mL/hr over 60 Minutes Intravenous  Once 05/10/20 1921 05/10/20 2111   05/10/20 1930  vancomycin (VANCOREADY) IVPB 1000 mg/200 mL  Status:  Discontinued        1,000 mg 200 mL/hr over 60 Minutes Intravenous  Once 05/10/20 1921 05/10/20 2046      Medications: Scheduled Meds: . Chlorhexidine Gluconate Cloth  6 each Topical Daily  . feeding supplement (PROSource TF)  45 mL Per Tube Daily  . heparin injection (subcutaneous)  5,000 Units Subcutaneous Q8H  .  sodium chloride flush  10-40 mL Intracatheter Q12H   Continuous Infusions: . cefTRIAXone (ROCEPHIN)  IV Stopped (05/12/20 1456)  . feeding supplement (OSMOLITE 1.5 CAL) 45 mL/hr at 05/13/20 0730  . levETIRAcetam Stopped (05/13/20 0542)  . norepinephrine (LEVOPHED) Adult infusion Stopped (05/11/20 1039)  . potassium PHOSPHATE IVPB (in mmol) 86 mL/hr at 05/13/20 1100   PRN Meds:.acetaminophen, melatonin, sodium chloride flush    Objective: Weight change:   Intake/Output Summary (Last 24 hours) at 05/13/2020 1230 Last data filed at 05/13/2020 1100 Gross per 24 hour  Intake 1546.45 ml  Output 905 ml  Net 641.45 ml   Blood pressure 136/89, pulse (!) 104, temperature 99 F (37.2 C), temperature source Axillary, resp. rate (!) 21, height 5\' 4"  (1.626 m), weight 106.6 kg, SpO2 90 %. Temp:  [99 F (37.2 C)-99.4 F (37.4 C)] 99 F (37.2 C) (04/19 1208) Pulse Rate:  [84-110] 104 (04/19 1100) Resp:  [14-35] 21 (04/19 1100) BP: (96-146)/(50-92) 136/89 (04/19 1100) SpO2:  [90 %-100 %] 90 % (04/19 1100)  Physical Exam: Physical Exam Constitutional:  General: She is not in acute distress.    Appearance: She is well-developed. She is obese. She is not diaphoretic.  HENT:     Head: Normocephalic and atraumatic.     Right Ear: External ear normal.     Left Ear: External ear normal.     Mouth/Throat:     Pharynx: No oropharyngeal exudate.  Eyes:     General: No scleral icterus.    Conjunctiva/sclera: Conjunctivae normal.     Pupils: Pupils are equal, round, and reactive to light.  Cardiovascular:     Rate and Rhythm: Normal rate and regular rhythm.     Heart sounds: Normal heart sounds. No murmur heard. No friction rub. No gallop.   Pulmonary:     Effort: Pulmonary effort is normal. No respiratory distress.     Breath sounds: Normal breath sounds. No wheezing or rales.  Abdominal:     General: Bowel sounds are normal. There is no distension.     Palpations: Abdomen is soft.      Tenderness: There is no abdominal tenderness. There is no rebound.  Musculoskeletal:        General: No tenderness. Normal range of motion.  Lymphadenopathy:     Cervical: No cervical adenopathy.  Skin:    General: Skin is warm and dry.     Coloration: Skin is pale.     Findings: No erythema or rash.  Neurological:     Mental Status: She is alert.     Motor: No abnormal muscle tone.     Coordination: Coordination normal.  Psychiatric:        Mood and Affect: Affect is flat.        Speech: Speech is delayed.      CBC:    BMET Recent Labs    05/12/20 0500 05/13/20 0500  NA 140 141  K 3.9 3.7  CL 112* 109  CO2 22 24  GLUCOSE 87 176*  BUN 28* 24*  CREATININE 1.48* 1.16*  CALCIUM 8.0* 8.2*     Liver Panel  Recent Labs    05/10/20 1918 05/13/20 0746  PROT 6.4* 5.3*  ALBUMIN 3.1* 2.3*  AST 29 34  ALT 37 29  ALKPHOS 96 161*  BILITOT 0.5 0.5  BILIDIR  --  0.2  IBILI  --  0.3       Sedimentation Rate No results for input(s): ESRSEDRATE in the last 72 hours. C-Reactive Protein No results for input(s): CRP in the last 72 hours.  Micro Results: Recent Results (from the past 720 hour(s))  Culture, blood (Routine x 2)     Status: Abnormal (Preliminary result)   Collection Time: 05/10/20  7:20 PM   Specimen: BLOOD  Result Value Ref Range Status   Specimen Description BLOOD SITE NOT SPECIFIED  Final   Special Requests   Final    BOTTLES DRAWN AEROBIC AND ANAEROBIC Blood Culture results may not be optimal due to an inadequate volume of blood received in culture bottles   Culture  Setup Time (A)  Final    GRAM VARIABLE ROD IN BOTH AEROBIC AND ANAEROBIC BOTTLES CRITICAL RESULT CALLED TO, READ BACK BY AND VERIFIED WITH: PHARMD JESSICA M. [id number] FCP    Culture (A)  Final    PROTEUS MIRABILIS SUSCEPTIBILITIES TO FOLLOW CULTURE REINCUBATED FOR BETTER GROWTH Performed at [facility], [address], [place], [place] [address]    Report Status  PENDING  Incomplete  Blood Culture ID Panel (Reflexed)  Status: Abnormal   Collection Time: 05/10/20  7:20 PM  Result Value Ref Range Status   Enterococcus faecalis NOT DETECTED NOT DETECTED Final   Enterococcus Faecium NOT DETECTED NOT DETECTED Final   Listeria monocytogenes NOT DETECTED NOT DETECTED Final   Staphylococcus species NOT DETECTED NOT DETECTED Final   Staphylococcus aureus (BCID) NOT DETECTED NOT DETECTED Final   Staphylococcus epidermidis NOT DETECTED NOT DETECTED Final   Staphylococcus lugdunensis NOT DETECTED NOT DETECTED Final   Streptococcus species NOT DETECTED NOT DETECTED Final   Streptococcus agalactiae NOT DETECTED NOT DETECTED Final   Streptococcus pneumoniae NOT DETECTED NOT DETECTED Final   Streptococcus pyogenes NOT DETECTED NOT DETECTED Final   A.calcoaceticus-baumannii NOT DETECTED NOT DETECTED Final   Bacteroides fragilis NOT DETECTED NOT DETECTED Final   Enterobacterales DETECTED (A) NOT DETECTED Final    Comment: Enterobacterales represent a large order of gram negative bacteria, not a single organism. CRITICAL RESULT CALLED TO, READ BACK BY AND VERIFIED WITH: PHARMD JESSICA M. [id number] FCP    Enterobacter cloacae complex NOT DETECTED NOT DETECTED Final   Escherichia coli NOT DETECTED NOT DETECTED Final   Klebsiella aerogenes NOT DETECTED NOT DETECTED Final   Klebsiella oxytoca NOT DETECTED NOT DETECTED Final   Klebsiella pneumoniae NOT DETECTED NOT DETECTED Final   Proteus species DETECTED (A) NOT DETECTED Final    Comment: CRITICAL RESULT CALLED TO, READ BACK BY AND VERIFIED WITH: PHARMD JESSICA M. [phone] FCP    Salmonella species NOT DETECTED NOT DETECTED Final   Serratia marcescens NOT DETECTED NOT DETECTED Final   Haemophilus influenzae NOT DETECTED NOT DETECTED Final   Neisseria meningitidis NOT DETECTED NOT DETECTED Final   Pseudomonas aeruginosa NOT DETECTED NOT DETECTED Final   Stenotrophomonas maltophilia NOT DETECTED NOT  DETECTED Final   Candida albicans NOT DETECTED NOT DETECTED Final   Candida auris NOT DETECTED NOT DETECTED Final   Candida glabrata NOT DETECTED NOT DETECTED Final   Candida krusei NOT DETECTED NOT DETECTED Final   Candida parapsilosis NOT DETECTED NOT DETECTED Final   Candida tropicalis NOT DETECTED NOT DETECTED Final   Cryptococcus neoformans/gattii NOT DETECTED NOT DETECTED Final   CTX-M ESBL NOT DETECTED NOT DETECTED Final   Carbapenem resistance IMP NOT DETECTED NOT DETECTED Final   Carbapenem resistance KPC NOT DETECTED NOT DETECTED Final   Carbapenem resistance NDM NOT DETECTED NOT DETECTED Final   Carbapenem resist OXA 48 LIKE NOT DETECTED NOT DETECTED Final   Carbapenem resistance VIM NOT DETECTED NOT DETECTED Final    Comment: Performed at [facility], [address], [place], [place] [address]  Resp Panel by RT-PCR (Flu A&B, Covid) Nasopharyngeal Swab     Status: None   Collection Time: 05/10/20  7:45 PM   Specimen: Nasopharyngeal Swab; Nasopharyngeal(NP) swabs in vial transport medium  Result Value Ref Range Status   SARS Coronavirus 2 by RT PCR NEGATIVE NEGATIVE Final    Comment: (NOTE) SARS-CoV-2 target nucleic acids are NOT DETECTED.  The SARS-CoV-2 RNA is generally detectable in upper respiratory specimens during the acute phase of infection. The lowest concentration of SARS-CoV-2 viral copies this assay can detect is 138 copies/mL. A negative result does not preclude SARS-Cov-2 infection and should not be used as the sole basis for treatment or other patient management decisions. A negative result may occur with  improper specimen collection/handling, submission of specimen other than nasopharyngeal swab, presence of viral mutation(s) within the areas targeted by this assay, and inadequate number of viral copies(<138  copies/mL). A negative result must be combined with clinical observations, patient history, and epidemiological information. The expected  result is Negative.  Fact Sheet for Patients:  BloggerCourse.com  Fact Sheet for Healthcare Providers:  SeriousBroker.it  This test is no t yet approved or cleared by the [place] FDA and  has been authorized for detection and/or diagnosis of SARS-CoV-2 by FDA under an Emergency Use Authorization (EUA). This EUA will remain  in effect (meaning this test can be used) for the duration of the COVID-19 declaration under Section 564(b)(1) of the Act, 21 U.S.C.section 360bbb-3(b)(1), unless the authorization is terminated  or revoked sooner.       Influenza A by PCR NEGATIVE NEGATIVE Final   Influenza B by PCR NEGATIVE NEGATIVE Final    Comment: (NOTE) The Xpert Xpress SARS-CoV-2/FLU/RSV plus assay is intended as an aid in the diagnosis of influenza from Nasopharyngeal swab specimens and should not be used as a sole basis for treatment. Nasal washings and aspirates are unacceptable for Xpert Xpress SARS-CoV-2/FLU/RSV testing.  Fact Sheet for Patients: BloggerCourse.com  Fact Sheet for Healthcare Providers: SeriousBroker.it  This test is not yet approved or cleared by the [place] FDA and has been authorized for detection and/or diagnosis of SARS-CoV-2 by FDA under an Emergency Use Authorization (EUA). This EUA will remain in effect (meaning this test can be used) for the duration of the COVID-19 declaration under Section 564(b)(1) of the Act, 21 U.S.C. section 360bbb-3(b)(1), unless the authorization is terminated or revoked.  Performed at [facility], [address] [place], [place] [address]   Culture, blood (Routine x 2)     Status: Abnormal (Preliminary result)   Collection Time: 05/10/20  9:06 PM   Specimen: BLOOD  Result Value Ref Range Status   Specimen Description BLOOD SITE NOT SPECIFIED  Final   Special Requests   Final    BOTTLES DRAWN AEROBIC AND  ANAEROBIC Blood Culture results may not be optimal due to an inadequate volume of blood received in culture bottles   Culture  Setup Time (A)  Final    GRAM VARIABLE ROD IN BOTH AEROBIC AND ANAEROBIC BOTTLES CRITICAL VALUE NOTED.  VALUE IS CONSISTENT WITH PREVIOUSLY REPORTED AND CALLED VALUE. Performed at [facility], [address], [place], [place] [address]    Culture PROTEUS MIRABILIS (A)  Final   Report Status PENDING  Incomplete  Urine Culture     Status: Abnormal   Collection Time: 05/11/20  2:07 AM   Specimen: Urine, Cystoscope  Result Value Ref Range Status   Specimen Description CYSTOSCOPY URINE, CATHETERIZED  Final   Special Requests   Final    PATIENT ON FOLLOWING MAXAPIME Performed at [facility], [address], [place], [place] [address]    Culture >=100,000 COLONIES/mL PROTEUS MIRABILIS (A)  Final   Report Status 05/13/2020 FINAL  Final   Organism ID, Bacteria PROTEUS MIRABILIS (A)  Final      Susceptibility   Proteus mirabilis - MIC*    AMPICILLIN <=2 SENSITIVE Sensitive     CEFAZOLIN 8 SENSITIVE Sensitive     CEFEPIME <=0.12 SENSITIVE Sensitive     CEFTAZIDIME <=1 SENSITIVE Sensitive     CEFTRIAXONE <=0.25 SENSITIVE Sensitive     CIPROFLOXACIN >=4 RESISTANT Resistant     GENTAMICIN <=1 SENSITIVE Sensitive     IMIPENEM 2 SENSITIVE Sensitive     TRIMETH/SULFA <=20 SENSITIVE Sensitive     AMPICILLIN/SULBACTAM <=2 SENSITIVE Sensitive  PIP/TAZO <=4 SENSITIVE Sensitive     * >=100,000 COLONIES/mL PROTEUS MIRABILIS  MRSA PCR Screening     Status: None   Collection Time: 05/11/20  5:37 AM   Specimen: Nasal Mucosa; Nasopharyngeal  Result Value Ref Range Status   MRSA by PCR NEGATIVE NEGATIVE Final    Comment:        The GeneXpert MRSA Assay (FDA approved for NASAL specimens only), is one component of a comprehensive MRSA colonization surveillance program. It is not intended to diagnose MRSA infection nor to guide or monitor treatment for MRSA  infections. Performed at [facility], [address], [place], [place] [address]   Culture, blood (routine x 2)     Status: None (Preliminary result)   Collection Time: 05/12/20 10:50 AM   Specimen: BLOOD  Result Value Ref Range Status   Specimen Description BLOOD LEFT ANTECUBITAL  Final   Special Requests   Final    BOTTLES DRAWN AEROBIC AND ANAEROBIC Blood Culture adequate volume   Culture   Final    NO GROWTH < 24 HOURS Performed at [facility], [address] [place], [place] [address]    Report Status PENDING  Incomplete  Culture, blood (routine x 2)     Status: None (Preliminary result)   Collection Time: 05/12/20 10:55 AM   Specimen: BLOOD  Result Value Ref Range Status   Specimen Description BLOOD LEFT ANTECUBITAL  Final   Special Requests   Final    BOTTLES DRAWN AEROBIC AND ANAEROBIC Blood Culture adequate volume   Culture   Final    NO GROWTH < 24 HOURS Performed at [facility], [address], [place], [place] [address]    Report Status PENDING  Incomplete    Studies/Results: ECHOCARDIOGRAM COMPLETE BUBBLE STUDY  Result Date: 05/13/2020    ECHOCARDIOGRAM REPORT   Patient Name:   IASIA Nancarrow Date of Exam: 05/13/2020 Medical Rec #:  [id number]     Height:       64.0 in Accession #:    [id number]    Weight:       235.0 lb Date of Birth:  10/16/1965     BSA:          2.095 m Patient Age:    55 years      BP:           136/76 mmHg Patient Gender: F             HR:           104 bpm. Exam Location:  Inpatient Procedure: 2D Echo, Cardiac Doppler, Color Doppler and Saline Contrast Bubble            Study                                 MODIFIED REPORT:  This report was modified by Thurmon Fair MD on 05/13/2020 due to comment re:                              absence of tamponade.  Indications:     I31.3 Pericardial effusion (noninflammatory)  History:         Patient has no prior history of Echocardiogram examinations.                  CHF, COPD and Stroke,  Signs/Symptoms:Bacteremia  and Altered                  Mental Status; Risk Factors:Hypertension and Diabetes.                  Pericardial effusion.  Sonographer:     Sheralyn Boatman RDCS Referring Phys:  [id number] [place] Diagnosing Phys: Thurmon Fair MD  Sonographer Comments: Technically difficult study due to poor echo windows and patient is morbidly obese. Image acquisition challenging due to patient body habitus. Supine, could not turn. Patient moaning throughout test. Very difficult windows. IMPRESSIONS  1. Left ventricular ejection fraction, by estimation, is 60 to 65%. The left ventricle has normal function. The left ventricle has no regional wall motion abnormalities. Indeterminate diastolic filling due to E-A fusion.  2. Right ventricular systolic function is normal. The right ventricular size is normal.  3. Moderate pericardial effusion. The pericardial effusion is circumferential, but mostly posterior to the left ventricle.     There is no evidence of pericardial tamponade  4. The mitral valve is normal in structure. No evidence of mitral valve regurgitation. No evidence of mitral stenosis.  5. The aortic valve is normal in structure. Aortic valve regurgitation is not visualized. No aortic stenosis is present.  6. The inferior vena cava is normal in size with greater than 50% respiratory variability, suggesting right atrial pressure of 3 mmHg.  7. Agitated saline contrast bubble study was negative, with no evidence of any interatrial shunt. FINDINGS  Left Ventricle: Left ventricular ejection fraction, by estimation, is 60 to 65%. The left ventricle has normal function. The left ventricle has no regional wall motion abnormalities. The left ventricular internal cavity size was normal in size. There is  no left ventricular hypertrophy. Indeterminate diastolic filling due to E-A fusion. Right Ventricle: The right ventricular size is normal. No increase in right ventricular wall thickness. Right ventricular  systolic function is normal. Left Atrium: Left atrial size was normal in size. Right Atrium: Right atrial size was normal in size. Pericardium: There is no evidence of pericardial tamponade. A moderately sized pericardial effusion is present. The pericardial effusion is circumferential and posterior to the left ventricle. There is no evidence of cardiac tamponade. Mitral Valve: The mitral valve is normal in structure. No evidence of mitral valve regurgitation. No evidence of mitral valve stenosis. Tricuspid Valve: The tricuspid valve is normal in structure. Tricuspid valve regurgitation is not demonstrated. No evidence of tricuspid stenosis. Aortic Valve: The aortic valve is normal in structure. Aortic valve regurgitation is not visualized. No aortic stenosis is present. Pulmonic Valve: The pulmonic valve was normal in structure. Pulmonic valve regurgitation is not visualized. No evidence of pulmonic stenosis. Aorta: The aortic root is normal in size and structure. Venous: The inferior vena cava is normal in size with greater than 50% respiratory variability, suggesting right atrial pressure of 3 mmHg. IAS/Shunts: No atrial level shunt detected by color flow Doppler. Agitated saline contrast was given intravenously to evaluate for intracardiac shunting. Agitated saline contrast bubble study was negative, with no evidence of any interatrial shunt.  LEFT VENTRICLE PLAX 2D LVIDd:         4.50 cm     Diastology LVIDs:         2.80 cm     LV e' medial:    5.22 cm/s LV PW:         1.30 cm     LV E/e' medial:  20.7 LV IVS:  1.10 cm     LV e' lateral:   8.38 cm/s LVOT diam:     2.00 cm     LV E/e' lateral: 12.9 LV SV:         44 LV SV Index:   21 LVOT Area:     3.14 cm  LV Volumes (MOD) LV vol d, MOD A2C: 87.6 ml LV vol d, MOD A4C: 91.4 ml LV vol s, MOD A2C: 42.3 ml LV vol s, MOD A4C: 34.4 ml LV SV MOD A2C:     45.3 ml LV SV MOD A4C:     91.4 ml LV SV MOD BP:      50.1 ml RIGHT VENTRICLE             IVC RV S prime:      13.80 cm/s  IVC diam: 1.70 cm TAPSE (M-mode): 2.0 cm LEFT ATRIUM             Index       RIGHT ATRIUM           Index LA diam:        3.10 cm 1.48 cm/m  RA Area:     10.70 cm LA Vol (A2C):   53.6 ml 25.58 ml/m RA Volume:   19.70 ml  9.40 ml/m LA Vol (A4C):   20.7 ml 9.88 ml/m LA Biplane Vol: 35.3 ml 16.85 ml/m  AORTIC VALVE LVOT Vmax:   102.00 cm/s LVOT Vmean:  61.300 cm/s LVOT VTI:    0.139 m  AORTA Ao Root diam: 3.00 cm Ao Asc diam:  3.60 cm MITRAL VALVE MV Area (PHT): 5.27 cm     SHUNTS MV Decel Time: 144 msec     Systemic VTI:  0.14 m MV E velocity: 108.00 cm/s  Systemic Diam: 2.00 cm Mihai Croitoru MD Electronically signed by Thurmon Fair MD Signature Date/Time: 05/13/2020/11:37:41 AM    Final (Updated)       Assessment/Plan:  INTERVAL HISTORY: Urine cultures growing a fairly pansensitive organism   Active Problems:   Sepsis (HCC)   Hydronephrosis with renal and ureteral calculus obstruction   Leukocytosis   Lactic acidosis   Hypoalbuminemia   Hyperglycemia   Acute kidney injury superimposed on CKD (HCC)   S/P cystoscopy with ureteral stent placement   Hydronephrosis with obstructing calculus    Julie Donovan is a 55 y.o. female with history of stroke right-sided weakness aphasia who was admitted with encephalopathy and shock resuscitated found to have bilateral hydronephrosis status post cystoscopy with placement of stents by urology with worsening hypotension postoperatively.  She is growing Proteus mirabilis from urine and from her blood cultures sensitivities from urine showed the organism to be quite sensitive though we do not have formal sensitivities from blood cultures back yet.   If her blood cultures so same sensitivities his urine would change her over to IV ampicillin and then to amoxicillin  We can continue oral amoxicillin through her bland ureteroscope he and laser lithotripsy on Jun 03, 2020  I will make sure she also has a followup appt with me or one of  my partners between this hospitalization and then as well   LOS: 2 days   Acey Lav 05/13/2020, 12:30 PM

## 2020-05-14 ENCOUNTER — Other Ambulatory Visit: Payer: Self-pay | Admitting: Urology

## 2020-05-14 ENCOUNTER — Inpatient Hospital Stay (HOSPITAL_COMMUNITY): Payer: Medicaid Other

## 2020-05-14 DIAGNOSIS — D72829 Elevated white blood cell count, unspecified: Secondary | ICD-10-CM | POA: Diagnosis not present

## 2020-05-14 DIAGNOSIS — Z739 Problem related to life management difficulty, unspecified: Secondary | ICD-10-CM

## 2020-05-14 DIAGNOSIS — N132 Hydronephrosis with renal and ureteral calculous obstruction: Secondary | ICD-10-CM | POA: Diagnosis not present

## 2020-05-14 DIAGNOSIS — A419 Sepsis, unspecified organism: Secondary | ICD-10-CM | POA: Diagnosis not present

## 2020-05-14 DIAGNOSIS — N179 Acute kidney failure, unspecified: Secondary | ICD-10-CM | POA: Diagnosis not present

## 2020-05-14 DIAGNOSIS — R4182 Altered mental status, unspecified: Secondary | ICD-10-CM | POA: Diagnosis not present

## 2020-05-14 DIAGNOSIS — G9340 Encephalopathy, unspecified: Secondary | ICD-10-CM

## 2020-05-14 DIAGNOSIS — E872 Acidosis: Secondary | ICD-10-CM | POA: Diagnosis not present

## 2020-05-14 DIAGNOSIS — R739 Hyperglycemia, unspecified: Secondary | ICD-10-CM | POA: Diagnosis not present

## 2020-05-14 DIAGNOSIS — I959 Hypotension, unspecified: Secondary | ICD-10-CM

## 2020-05-14 HISTORY — DX: Encephalopathy, unspecified: G93.40

## 2020-05-14 LAB — CBC
HCT: 35.8 % — ABNORMAL LOW (ref 36.0–46.0)
Hemoglobin: 11.6 g/dL — ABNORMAL LOW (ref 12.0–15.0)
MCH: 30.3 pg (ref 26.0–34.0)
MCHC: 32.4 g/dL (ref 30.0–36.0)
MCV: 93.5 fL (ref 80.0–100.0)
Platelets: 77 10*3/uL — ABNORMAL LOW (ref 150–400)
RBC: 3.83 MIL/uL — ABNORMAL LOW (ref 3.87–5.11)
RDW: 14 % (ref 11.5–15.5)
WBC: 15.2 10*3/uL — ABNORMAL HIGH (ref 4.0–10.5)
nRBC: 0 % (ref 0.0–0.2)

## 2020-05-14 LAB — GLUCOSE, CAPILLARY
Glucose-Capillary: 262 mg/dL — ABNORMAL HIGH (ref 70–99)
Glucose-Capillary: 252 mg/dL — ABNORMAL HIGH (ref 70–99)
Glucose-Capillary: 250 mg/dL — ABNORMAL HIGH (ref 70–99)
Glucose-Capillary: 308 mg/dL — ABNORMAL HIGH (ref 70–99)
Glucose-Capillary: 330 mg/dL — ABNORMAL HIGH (ref 70–99)

## 2020-05-14 LAB — BASIC METABOLIC PANEL
Anion gap: 8 (ref 5–15)
BUN: 14 mg/dL (ref 6–20)
CO2: 25 mmol/L (ref 22–32)
Calcium: 7.8 mg/dL — ABNORMAL LOW (ref 8.9–10.3)
Chloride: 105 mmol/L (ref 98–111)
Creatinine, Ser: 0.85 mg/dL (ref 0.44–1.00)
GFR, Estimated: >60 mL/min (ref >60)
Glucose, Bld: 245 mg/dL — ABNORMAL HIGH (ref 70–99)
Potassium: 3.9 mmol/L (ref 3.5–5.1)
Sodium: 138 mmol/L (ref 135–145)

## 2020-05-14 LAB — LACTIC ACID, PLASMA: Lactic Acid, Venous: 2.3 mmol/L (ref 0.5–1.9)

## 2020-05-14 LAB — MAGNESIUM: Magnesium: 1.5 mg/dL — ABNORMAL LOW (ref 1.7–2.4)

## 2020-05-14 LAB — PHOSPHORUS: Phosphorus: 1.8 mg/dL — ABNORMAL LOW (ref 2.5–4.6)

## 2020-05-14 LAB — HEPARIN INDUCED PLATELET AB (HIT ANTIBODY): Heparin Induced Plt Ab: 0.083 OD (ref 0.000–0.400)

## 2020-05-14 MED ORDER — MORPHINE SULFATE (PF) 2 MG/ML IV SOLN: 2.0000 mg | INTRAVENOUS | Status: DC | PRN

## 2020-05-14 MED ORDER — INSULIN ASPART 100 UNIT/ML ~~LOC~~ SOLN
5.0000 [IU] | Freq: Three times a day (TID) | SUBCUTANEOUS | Status: DC
  Administered 2020-05-15 (×3): 5 [IU] via SUBCUTANEOUS

## 2020-05-14 MED ORDER — INSULIN ASPART 100 UNIT/ML ~~LOC~~ SOLN
0.0000 [IU] | Freq: Every day | SUBCUTANEOUS | Status: DC
  Administered 2020-05-14: 4 [IU] via SUBCUTANEOUS

## 2020-05-14 MED ORDER — INSULIN ASPART 100 UNIT/ML ~~LOC~~ SOLN
0.0000 [IU] | Freq: Three times a day (TID) | SUBCUTANEOUS | Status: DC
  Administered 2020-05-14: 7 [IU] via SUBCUTANEOUS
  Administered 2020-05-14: 3 [IU] via SUBCUTANEOUS
  Administered 2020-05-15: 5 [IU] via SUBCUTANEOUS
  Administered 2020-05-15: 9 [IU] via SUBCUTANEOUS
  Administered 2020-05-15: 3 [IU] via SUBCUTANEOUS

## 2020-05-14 MED ORDER — MORPHINE SULFATE (PF) 2 MG/ML IV SOLN: 1.0000 mg | INTRAVENOUS | Status: DC | PRN

## 2020-05-14 MED ORDER — LACTATED RINGERS IV SOLN: INTRAVENOUS | Status: AC

## 2020-05-14 MED ORDER — POTASSIUM PHOSPHATES 15 MMOLE/5ML IV SOLN
20.0000 mmol | Freq: Once | INTRAVENOUS | Status: AC
  Administered 2020-05-14: 20 mmol via INTRAVENOUS
  Filled 2020-05-14: qty 6.67

## 2020-05-14 MED ORDER — MAGNESIUM SULFATE 2 GM/50ML IV SOLN
2.0000 g | Freq: Once | INTRAVENOUS | Status: AC
  Administered 2020-05-14: 2 g via INTRAVENOUS
  Filled 2020-05-14: qty 50

## 2020-05-14 MED ORDER — INSULIN GLARGINE 100 UNIT/ML ~~LOC~~ SOLN
20.0000 [IU] | Freq: Every day | SUBCUTANEOUS | Status: DC
  Administered 2020-05-14 – 2020-05-16 (×3): 20 [IU] via SUBCUTANEOUS
  Filled 2020-05-14 (×4): qty 0.2

## 2020-05-14 MED ORDER — METOPROLOL TARTRATE 12.5 MG HALF TABLET
12.5000 mg | ORAL_TABLET | Freq: Two times a day (BID) | ORAL | Status: DC
  Administered 2020-05-14 – 2020-05-15 (×2): 12.5 mg via ORAL
  Filled 2020-05-14 (×2): qty 1

## 2020-05-14 MED ORDER — ALBUTEROL SULFATE (2.5 MG/3ML) 0.083% IN NEBU: 2.5000 mg | INHALATION_SOLUTION | RESPIRATORY_TRACT | Status: DC | PRN

## 2020-05-14 NOTE — Discharge Instructions (Signed)
Ureteral Stent Implantation, Care After This sheet gives you information about how to care for yourself after your procedure. Your health care provider may also give you more specific instructions. If you have problems or questions, contact Dr. Mena Goes, [facility], [phone].   Be sure to keep follow-up with Dr. Mena Goes to plan stent/stone removal, [facility], [phone]  What can I expect after the procedure? After the procedure, it is common to have:  Nausea.  Mild pain when you urinate. You may feel this pain in your lower back or lower abdomen. The pain should stop within a few minutes after you urinate. This may last for up to 1 week.  A small amount of blood in your urine for several days. Follow these instructions at home: Medicines  Take over-the-counter and prescription medicines only as told by your health care provider.  If you were prescribed an antibiotic medicine, take it as told by your health care provider. Do not stop taking the antibiotic even if you start to feel better.  Do not drive for 24 hours if you were given a sedative during your procedure.  Ask your health care provider if the medicine prescribed to you requires you to avoid driving or using heavy machinery. Activity  Rest as told by your health care provider.  Avoid sitting for a long time without moving. Get up to take short walks every 1-2 hours. This is important to improve blood flow and breathing. Ask for help if you feel weak or unsteady.  Return to your normal activities as told by your health care provider. Ask your health care provider what activities are safe for you. General instructions  Watch for any blood in your urine. Call your health care provider if the amount of blood in your urine increases.  If you have a catheter: ? Follow instructions from your health care provider about taking care of your catheter and collection bag. ? Do not take baths, swim, or use a hot  tub until your health care provider approves. Ask your health care provider if you may take showers. You may only be allowed to take sponge baths.  Drink enough fluid to keep your urine pale yellow.  Do not use any products that contain nicotine or tobacco, such as cigarettes, e-cigarettes, and chewing tobacco. These can delay healing after surgery. If you need help quitting, ask your health care provider.  Keep all follow-up visits as told by your health care provider. This is important.   Contact a health care provider if:  You have pain that gets worse or does not get better with medicine, especially pain when you urinate.  You have difficulty urinating.  You feel nauseous or you vomit repeatedly during a period of more than 2 days after the procedure. Get help right away if:  Your urine is dark red or has blood clots in it.  You are leaking urine (have incontinence).  The end of the stent comes out of your urethra.  You cannot urinate.  You have sudden, sharp, or severe pain in your abdomen or lower back.  You have a fever.  You have swelling or pain in your legs.  You have difficulty breathing. Summary  After the procedure, it is common to have mild pain when you urinate that goes away within a few minutes after you urinate. This may last for up to 1 week.  Watch for any blood in your urine. Call your health care provider if the amount of blood  in your urine increases.  Take over-the-counter and prescription medicines only as told by your health care provider.  Drink enough fluid to keep your urine pale yellow. This information is not intended to replace advice given to you by your health care provider. Make sure you discuss any questions you have with your health care provider. Document Revised: 10/18/2017 Document Reviewed: 10/19/2017 Elsevier Patient Education  2021 [employer].

## 2020-05-14 NOTE — Evaluation (Addendum)
Clinical/Bedside Swallow Evaluation Patient Details  Name: Julie Donovan MRN: [id number] Date of Birth: 1965-02-11  Today's Date: 05/14/2020 Time: SLP Start Time (ACUTE ONLY): 1528 SLP Stop Time (ACUTE ONLY): 1545 SLP Time Calculation (min) (ACUTE ONLY): 17.4 min  Past Medical History:  Past Medical History:  Diagnosis Date  . CHF (congestive heart failure) (HCC)   . CKD (chronic kidney disease)   . COPD (chronic obstructive pulmonary disease) (HCC)   . CVA (cerebral vascular accident) (HCC)   . Hypertension   . Type 2 diabetes mellitus (HCC)    Past Surgical History:  Past Surgical History:  Procedure Laterality Date  . CYSTOSCOPY W/ URETERAL STENT PLACEMENT Bilateral 05/11/2020   Procedure: CYSTOSCOPY WITH RETROGRADE PYELOGRAM/URETERAL STENT PLACEMENT;  Surgeon: Jerilee Field, MD;  Location: [facility] OR;  Service: Urology;  Laterality: Bilateral;   HPI:  Pt is a 55 y.o. female with medical history significant for COPD, CHF, CVA with right residual deficit, dementia, hypertension, T2DM, CKD 3A and morbid obesity who presented to the ED due to AMS. Pt found to have leukocytosis and obstructing nephrolithiasis with bilateral hydronephrosis. Pt was taken to OR for stent placement and subsequently has become hypotensive. Dx sepsis and UTI. EEG 4/20: mild to moderate diffuse encephalopathy, nonspecific etiology. No seizures or epileptiform discharges. Pt was made NPO on admission and Cortrak placed 4/18.   Assessment / Plan / Recommendation Clinical Impression  Pt was seen for bedside swallow evaluation and she denied a history of dysphagia, but her reliability as a historian is questioned due to dementia. Verbal output was limited and vocal intensity reduced, but she was able to respond to questions and make verbal requests. Pt's level of alertness was adequate for p.o. trials, but it waned quickly once stimuli were removed. Oral mechanism exam was limited due to pt's difficulty following  commands; however, oral motor strength and ROM appeared grossly WFL. Dentition was reduced. She demonstrated reduced lingual manipulation of boluses, signs of aspiration with thin liquids, and multiple swallows (up to five) with puree boluses, suggesting possible pharyngeal residue and/or piecemeal deglutition. It is recommended that her NPO status be maintained with allowance of ice chips following oral care. SLP will follow to assess improvement in swallow function with any further improvement in mentation and to determine the necessity of an instrumental assessment. SLP Visit Diagnosis: Dysphagia, unspecified (R13.10)    Aspiration Risk  Moderate aspiration risk    Diet Recommendation Ice chips PRN after oral care;NPO   Medication Administration: Via alternative means    Other  Recommendations Oral Care Recommendations: Oral care QID;Staff/trained caregiver to provide oral care   Follow up Recommendations  (TBD)      Frequency and Duration min 2x/week  2 weeks       Prognosis Prognosis for Safe Diet Advancement: Good Barriers to Reach Goals: Cognitive deficits      Swallow Study   General Date of Onset: 05/13/20 HPI: Pt is a 55 y.o. female with medical history significant for COPD, CHF, CVA with right residual deficit, dementia, hypertension, T2DM, CKD 3A and morbid obesity who presented to the ED due to AMS. Pt found to have leukocytosis and obstructing nephrolithiasis with bilateral hydronephrosis. Pt was taken to OR for stent placement and subsequently has become hypotensive. Dx sepsis and UTI. EEG 4/20: mild to moderate diffuse encephalopathy, nonspecific etiology. No seizures or epileptiform discharges. Pt was made NPO on admission and Cortrak placed 4/18. Type of Study: Bedside Swallow Evaluation Previous Swallow Assessment: none Diet Prior to  this Study: NPO;NG Tube Temperature Spikes Noted: No Respiratory Status: Room air History of Recent Intubation:  No Behavior/Cognition: Alert;Cooperative;Confused Oral Cavity Assessment: Dried secretions Oral Care Completed by SLP: No Oral Cavity - Dentition: Adequate natural dentition Vision: Functional for self-feeding Self-Feeding Abilities: Needs assist Patient Positioning: Upright in bed;Postural control adequate for testing Baseline Vocal Quality: Low vocal intensity Volitional Cough: Weak    Oral/Motor/Sensory Function Overall Oral Motor/Sensory Function:  (difficult to assess)   Ice Chips Ice chips: Impaired Presentation: Spoon Oral Phase Impairments: Reduced lingual movement/coordination Pharyngeal Phase Impairments: Cough - Delayed   Thin Liquid Thin Liquid: Impaired Presentation: Cup;Spoon;Straw Pharyngeal  Phase Impairments: Throat Clearing - Immediate;Throat Clearing - Delayed;Multiple swallows;Suspected delayed Swallow (with thin liquids via cup and straw)   Nectar Thick Nectar Thick Liquid: Not tested   Honey Thick Honey Thick Liquid: Not tested   Puree Puree: Impaired Presentation: Spoon Pharyngeal Phase Impairments: Multiple swallows (five noted)   Solid     Solid: Not tested     Julie Donovan I. Julie Clock, MS, CCC-SLP Acute Rehabilitation Services Office number [phone] Pager [phone]  Julie Donovan 05/14/2020,3:49 PM

## 2020-05-14 NOTE — Procedures (Addendum)
Patient Name: Julie Donovan  MRN: [id number]  Epilepsy Attending: Charlsie Quest  Referring Physician/Provider: Dr Baldwin [place] Date: 05/13/2020 Duration: 28.18 mins  Patient history: 55 year old female with altered mental status.  EEG to evaluate for seizures.  Level of alertness: Awake, asleep  AEDs during EEG study: Keppra  Technical aspects: This EEG study was done with scalp electrodes positioned according to the 10-20 International system of electrode placement. Electrical activity was acquired at a sampling rate of 500Hz  and reviewed with a high frequency filter of 70Hz  and a low frequency filter of 1Hz . EEG data were recorded continuously and digitally stored.   Description: The posterior dominant rhythm consists of 8-9 Hz activity of moderate voltage (25-35 uV) seen predominantly in posterior head regions, symmetric and reactive to eye opening and eye closing. Sleep was characterized by vertex waves, maximal frontocentral region. EEG showed continuous generalized 3 to 6 Hz theta-delta slowing.  Photic driving was not seen during photic stimulation.  Hyperventilation was not performed.     ABNORMALITY - Continuous slow, generalized  IMPRESSION: This study is suggestive of mild to moderate diffuse encephalopathy, nonspecific etiology. No seizures or epileptiform discharges were seen throughout the recording.  Julie Donovan 

## 2020-05-14 NOTE — Progress Notes (Addendum)
PROGRESS NOTE    Julie Donovan  WUJ:[id number] DOB: 02/14/1965 DOA: 05/10/2020 PCP: Karna Dupes, MD   Chief Complaint  Patient presents with  . Altered Mental Status     Brief Narrative: 55 year old female with a PMH of Dementia who is nonverbal at baseline, Hypertension, Diabetes Mellitus Type 2, Left CVA with right sided deficits, Diastolic Heart Failure, CKD3, and Morbid Obesity (BMI 40 kg/m2) who presented to the ED on 4/17 with altered mental status who quickly became unstable. She was admitted to the ICU and managed for Acute Encephalopathy and Septic Shock due to Proteus Bacteremia secondary to UTI found to have bilateral hydronephrosis s/p bilateral ureteral stent placement  Events: 4/17 Admitted to ICU for septic shock.  Bilateral Ureteral Stents placed. 4/17 Levophed was discontinued. 4/18 Transferred out of ICU  Subjective: Patient is drowsy. Neurological status is poor. Responds to painful stimuli in all four extremities. Does not follow commands. She is nonverbal at baseline. She is unable to pass her swallow evaluation. MRI and EEG ordered for today came back negative for any acute abnormality. Patient also appears slightly on the dry side. There is no JVD on exam.   Assessment & Plan: Active Problems:   Sepsis (HCC)   Hydronephrosis with renal and ureteral calculus obstruction   Leukocytosis   Lactic acidosis   Hypoalbuminemia   Hyperglycemia   Acute kidney injury superimposed on CKD (HCC)   S/P cystoscopy with ureteral stent placement   Hydronephrosis with obstructing calculus  Acute Toxic Metabolic Encephalopathy - Acute Infection vs Neurological: - Continue antibiotics per Infectious Disease. - Continue home med Keppra 500 mg BID. - EEG showed no epileptiform activity.  - MRI showed no acute infarct or other abnormality.  Proteus Bacteremia secondary to Obstructive Uropathy s/p Bilateral  Ureteral Stent Placement: Cultures were sensitive to  Cephalosporins. - On IV Ampicillin.  This is Day 4/24 of antibiotics post stent placement.  End date is 5/10 when she will have laser lithotripsy at Urology office outpatient. - Blood Cultures grew Proteus sensitive to Cephalosporins and resistant to Cipro. - De-escalate antibiotics to PO Amoxicillin on discharge.  - Infectious Disease and Urology are following, appreciate assistance and recommendations.  Leukocytosis, improved: - WBC decreased from 23K to 15K. - 4/18 repeat Blood Cultures are NG.  Mild Lactic Acidosis: Lactate is 2.3 mmol/L.  HR is ~ 100 beats/min.  BNP is 495 pg/mL.  EF is normal.   - Start LR 150 CC/hr (106 kg) x 10 hours and reassess tomorrow. - Monitor peripheral edema.  History of CVA with right sided weakness and aphasia: Patient is nonverbal at baseline. - Hold off on antiplatelets until thrombocytopenia improves.  Perform Neurochecks q4h. - Continue Atorvastatin 40 mg nightly.  Moderate Pericardial Effusion: - Echo shows no tamponade.  Hypokalemia: - K 3.9 mmol/L, replaced.  Hypomagnesium: - Mg 1.5 mg/dL, replaced.  Hypophosphatemia: - Ph 1.8 mg/dL, replaced.  Acute Kidney Injury, resolved: - Creatinine improved from 1.9 to 0.85 mg/dL. - Continue fluids and repeat BMP in am.  Acute Thrombocytopenia: - Platelets decreased from 237 to 77K over the last 5 days. - Check HIT panel and hold Heparin.  Diabetes Mellitus Type 2: - Glucose was running low on 4/18 and patient was placed on Dextrose infusion.  Glucose is now running high.   - Start Lantus 20 units nightly.   - Start Pre-Meal 5 units TID. - Continue Aspart SSi with POC glucose q6h.  Chronic COPD, not in acute exacberbation: -  Lung exam is stable. - Continue nebulizers PRN.  Essential Hypertension: - BP is stable. - Continue Lopressor 12.5 mg BID.  Moderate Protein Calore Malnutrition: - Albumin is 2.3 g/dL.  Morbid Obesity: BMI 40 kg/m2  Oropharyngeal Dysphagia: - SLP recommends  NPO status. - Continue Tube Feeds. - If does not improve over the next few days, we will need to begin discussion regarding PEG tube.  Physical Deconditioning: - PT is consulted, evaluation is pending.  Lines: Left IJV placed 4/17. Foley: Foley placed 4/17. Nutrition: Tube Feeds are running since 4/17.   Diet Order            Diet NPO time specified  Diet effective now                 Nutrition Problem: Inadequate oral intake Etiology: inability to eat Signs/Symptoms: NPO status Interventions: Tube feeding,Prostat Patient's Body mass index is 42.69 kg/m.   DVT prophylaxis: Place and maintain sequential compression device Start: 05/12/20 1117 SCDs Start: 05/11/20 0713 Code Status:   Code Status: Full Code  Family Communication: unable to reach family.  Status is: Inpatient  Remains inpatient appropriate because:Inpatient level of care appropriate due to severity of illness   Dispo: The patient is from: Home              Anticipated d/c is to: SNF              Patient currently is not medically stable to d/c.   Difficult to place patient No    Unresulted Labs (From admission, onward)          Start     Ordered   05/12/20 0500  Basic metabolic panel  Daily,   R      05/11/20 1553   05/12/20 0500  CBC  Daily,   R      05/11/20 1553   Signed and Held  Comprehensive metabolic panel  Tomorrow morning,   R        Signed and Held          Medications reviewed:  Scheduled Meds: . atorvastatin  40 mg Oral Daily  . Chlorhexidine Gluconate Cloth  6 each Topical Daily  . feeding supplement (PROSource TF)  45 mL Per Tube Daily  . insulin aspart  0-5 Units Subcutaneous QHS  . insulin aspart  0-9 Units Subcutaneous TID WC  . sodium chloride flush  10-40 mL Intracatheter Q12H   Continuous Infusions: . ampicillin (OMNIPEN) IV 2 g (05/14/20 1645)  . feeding supplement (OSMOLITE 1.5 CAL) 1,000 mL (05/13/20 1750)  . lactated ringers 150 mL/hr at 05/14/20 1647  .  levETIRAcetam 500 mg (05/14/20 1749)    Consultants:see note  Procedures:see note  Antimicrobials: Anti-infectives (From admission, onward)   Start     Dose/Rate Route Frequency Ordered Stop   05/14/20 1200  ampicillin (OMNIPEN) 2 g in sodium chloride 0.9 % 100 mL IVPB        2 g 300 mL/hr over 20 Minutes Intravenous Every 4 hours 05/13/20 1403     05/12/20 1500  cefTRIAXone (ROCEPHIN) 2 g in sodium chloride 0.9 % 100 mL IVPB  Status:  Discontinued        2 g 200 mL/hr over 30 Minutes Intravenous Every 24 hours 05/12/20 1042 05/13/20 1403   05/11/20 2100  ceFEPIme (MAXIPIME) 2 g in sodium chloride 0.9 % 100 mL IVPB  Status:  Discontinued        2 g 200 mL/hr over  30 Minutes Intravenous Every 24 hours 05/10/20 2046 05/11/20 1425   05/11/20 1515  ceFEPIme (MAXIPIME) 2 g in sodium chloride 0.9 % 100 mL IVPB  Status:  Discontinued        2 g 200 mL/hr over 30 Minutes Intravenous Every 12 hours 05/11/20 1425 05/12/20 1042   05/11/20 0200  cefOXitin (MEFOXIN) 2 g in sodium chloride 0.9 % 100 mL IVPB        2 g 200 mL/hr over 30 Minutes Intravenous  Once 05/11/20 0150 05/11/20 0232   05/10/20 2100  vancomycin (VANCOREADY) IVPB 2000 mg/400 mL        2,000 mg 200 mL/hr over 120 Minutes Intravenous  Once 05/10/20 2046 05/11/20 0011   05/10/20 2045  vancomycin variable dose per unstable renal function (pharmacist dosing)  Status:  Discontinued         Does not apply See admin instructions 05/10/20 2046 05/12/20 1042   05/10/20 1930  ceFEPIme (MAXIPIME) 2 g in sodium chloride 0.9 % 100 mL IVPB  Status:  Discontinued        2 g 200 mL/hr over 30 Minutes Intravenous  Once 05/10/20 1921 05/12/20 1042   05/10/20 1930  metroNIDAZOLE (FLAGYL) IVPB 500 mg        500 mg 100 mL/hr over 60 Minutes Intravenous  Once 05/10/20 1921 05/10/20 2111   05/10/20 1930  vancomycin (VANCOREADY) IVPB 1000 mg/200 mL  Status:  Discontinued        1,000 mg 200 mL/hr over 60 Minutes Intravenous  Once 05/10/20 1921  05/10/20 2046     Culture/Microbiology    Component Value Date/Time   SDES BLOOD LEFT ANTECUBITAL 05/12/2020 1055   SPECREQUEST  05/12/2020 1055    BOTTLES DRAWN AEROBIC AND ANAEROBIC Blood Culture adequate volume   CULT  05/12/2020 1055    NO GROWTH 2 DAYS Performed at [facility], [address], [place], [place] [address]    REPTSTATUS PENDING 05/12/2020 1055    Other culture-see note  Objective: Vitals: Today's Vitals   05/14/20 0739 05/14/20 0900 05/14/20 1156 05/14/20 1617  BP: 137/70  133/90 132/87  Pulse: 93  96 99  Resp: (!) 24  (!) 25 (!) 25  Temp: 98.9 F (37.2 C)  98.8 F (37.1 C) 98.9 F (37.2 C)  TempSrc: Oral  Axillary Axillary  SpO2: 93%  97% 95%  Weight:      Height:      PainSc:  6       Intake/Output Summary (Last 24 hours) at 05/14/2020 1816 Last data filed at 05/14/2020 1800 Gross per 24 hour  Intake 1418.5 ml  Output 5650 ml  Net -4231.5 ml   Filed Weights   05/10/20 2207 05/14/20 0500  Weight: 106.6 kg 112.8 kg   Weight change:   Intake/Output from previous day: 04/19 0701 - 04/20 0700 In: 1981.7 [I.V.:663; NG/GT:653.8; IV Piggyback:665] Out: 3650 [Urine:3650] Intake/Output this shift: Total I/O In: 0  Out: 2750 [Urine:2750] Filed Weights   05/10/20 2207 05/14/20 0500  Weight: 106.6 kg 112.8 kg    Examination: General exam: chronically ill appearing, obese, somnolent, opens eyes to stimuli, does not follow commands, nonverbal at baseline HEENT: NCAT, PERRL Respiratory system: CTAB, no WRR Cardiovascular system: did not appreciate a murmur, mild sinus tachycardia, No JVD. Gastrointestinal system: Large body habitus, Abdomen soft, NT,ND, BS+. Nervous System: Chronic right sided deficits. Extremities:   (+) edema, distal peripheral pulses palpable.  Skin: No rashes,no icterus. MSK: Physical deconditioning  Data Reviewed: I have personally reviewed following labs and imaging studies CBC: Recent Labs  Lab 05/10/20 1918  05/11/20 0149 05/11/20 0500 05/11/20 0713 05/11/20 1644 05/12/20 0500 05/13/20 0500 05/14/20 0233  WBC 21.0*  --  17.6* 22.5*  --  22.5* 23.2* 15.2*  NEUTROABS 18.6*  --   --   --   --   --   --   --   HGB 15.0   < > 11.8* 11.4* 11.2* 11.3* 11.2* 11.6*  HCT 45.1   < > 36.8 36.0 33.0* 34.5* 33.6* 35.8*  MCV 92.2  --  94.6 98.4  --  94.5 92.1 93.5  PLT 237  --  127* 110*  --  80* 79* 77*   < > = values in this interval not displayed.   Basic Metabolic Panel: Recent Labs  Lab 05/11/20 0500 05/11/20 0713 05/11/20 1644 05/11/20 2200 05/12/20 0500 05/12/20 2050 05/13/20 0500 05/13/20 1739 05/14/20 0233  NA 139  --  139 141 140  --  141  --  138  K 3.1*  --  4.1 4.3 3.9  --  3.7  --  3.9  CL 108  --   --  111 112*  --  109  --  105  CO2 17*  --   --  21* 22  --  24  --  25  GLUCOSE 129*  --   --  114* 87  --  176*  --  245*  BUN 37*  --   --  32* 28*  --  24*  --  14  CREATININE 2.51*  --   --  1.67* 1.48*  --  1.16*  --  0.85  CALCIUM 7.8*  --   --  7.9* 8.0*  --  8.2*  --  7.8*  MG  --  1.2*  --   --   --  1.6* 1.6* 1.7 1.5*  PHOS  --  3.0  --   --   --  1.1* <1.0* 2.4* 1.8*   Liver Function Tests: Recent Labs  Lab 05/10/20 1918 05/13/20 0746  AST 29 34  ALT 37 29  ALKPHOS 96 161*  BILITOT 0.5 0.5  PROT 6.4* 5.3*  ALBUMIN 3.1* 2.3*   Coagulation Profile: Recent Labs  Lab 05/10/20 1918 05/11/20 0713  INR 1.4* 1.8*   CBG: Recent Labs  Lab 05/13/20 2329 05/14/20 0455 05/14/20 0741 05/14/20 1149 05/14/20 1614  GLUCAP 241* 262* 252* 250* 308*   Sepsis Labs: Recent Labs  Lab 05/11/20 1143 05/11/20 2200 05/13/20 0707 05/14/20 1229  LATICACIDVEN 4.7* 2.6* 2.2* 2.3*    Recent Results (from the past 240 hour(s))  Culture, blood (Routine x 2)     Status: Abnormal   Collection Time: 05/10/20  7:20 PM   Specimen: BLOOD  Result Value Ref Range Status   Specimen Description BLOOD SITE NOT SPECIFIED  Final   Special Requests   Final    BOTTLES DRAWN  AEROBIC AND ANAEROBIC Blood Culture results may not be optimal due to an inadequate volume of blood received in culture bottles   Culture  Setup Time (A)  Final    GRAM VARIABLE ROD IN BOTH AEROBIC AND ANAEROBIC BOTTLES CRITICAL RESULT CALLED TO, READ BACK BY AND VERIFIED WITH: PHARMD JESSICA M. [id number] FCP Performed at [facility], [address], [place], [place] [address]    Culture PROTEUS MIRABILIS (A)  Final   Report Status 05/13/2020 FINAL  Final  Organism ID, Bacteria PROTEUS MIRABILIS  Final      Susceptibility   Proteus mirabilis - MIC*    AMPICILLIN <=2 SENSITIVE Sensitive     CEFAZOLIN 8 SENSITIVE Sensitive     CEFEPIME <=0.12 SENSITIVE Sensitive     CEFTAZIDIME <=1 SENSITIVE Sensitive     CEFTRIAXONE <=0.25 SENSITIVE Sensitive     CIPROFLOXACIN >=4 RESISTANT Resistant     GENTAMICIN <=1 SENSITIVE Sensitive     IMIPENEM 2 SENSITIVE Sensitive     TRIMETH/SULFA <=20 SENSITIVE Sensitive     AMPICILLIN/SULBACTAM <=2 SENSITIVE Sensitive     PIP/TAZO <=4 SENSITIVE Sensitive     * PROTEUS MIRABILIS  Blood Culture ID Panel (Reflexed)     Status: Abnormal   Collection Time: 05/10/20  7:20 PM  Result Value Ref Range Status   Enterococcus faecalis NOT DETECTED NOT DETECTED Final   Enterococcus Faecium NOT DETECTED NOT DETECTED Final   Listeria monocytogenes NOT DETECTED NOT DETECTED Final   Staphylococcus species NOT DETECTED NOT DETECTED Final   Staphylococcus aureus (BCID) NOT DETECTED NOT DETECTED Final   Staphylococcus epidermidis NOT DETECTED NOT DETECTED Final   Staphylococcus lugdunensis NOT DETECTED NOT DETECTED Final   Streptococcus species NOT DETECTED NOT DETECTED Final   Streptococcus agalactiae NOT DETECTED NOT DETECTED Final   Streptococcus pneumoniae NOT DETECTED NOT DETECTED Final   Streptococcus pyogenes NOT DETECTED NOT DETECTED Final   A.calcoaceticus-baumannii NOT DETECTED NOT DETECTED Final   Bacteroides fragilis NOT DETECTED NOT DETECTED Final    Enterobacterales DETECTED (A) NOT DETECTED Final    Comment: Enterobacterales represent a large order of gram negative bacteria, not a single organism. CRITICAL RESULT CALLED TO, READ BACK BY AND VERIFIED WITH: PHARMD JESSICA M. [id number] FCP    Enterobacter cloacae complex NOT DETECTED NOT DETECTED Final   Escherichia coli NOT DETECTED NOT DETECTED Final   Klebsiella aerogenes NOT DETECTED NOT DETECTED Final   Klebsiella oxytoca NOT DETECTED NOT DETECTED Final   Klebsiella pneumoniae NOT DETECTED NOT DETECTED Final   Proteus species DETECTED (A) NOT DETECTED Final    Comment: CRITICAL RESULT CALLED TO, READ BACK BY AND VERIFIED WITH: PHARMD JESSICA M. [id number] FCP    Salmonella species NOT DETECTED NOT DETECTED Final   Serratia marcescens NOT DETECTED NOT DETECTED Final   Haemophilus influenzae NOT DETECTED NOT DETECTED Final   Neisseria meningitidis NOT DETECTED NOT DETECTED Final   Pseudomonas aeruginosa NOT DETECTED NOT DETECTED Final   Stenotrophomonas maltophilia NOT DETECTED NOT DETECTED Final   Candida albicans NOT DETECTED NOT DETECTED Final   Candida auris NOT DETECTED NOT DETECTED Final   Candida glabrata NOT DETECTED NOT DETECTED Final   Candida krusei NOT DETECTED NOT DETECTED Final   Candida parapsilosis NOT DETECTED NOT DETECTED Final   Candida tropicalis NOT DETECTED NOT DETECTED Final   Cryptococcus neoformans/gattii NOT DETECTED NOT DETECTED Final   CTX-M ESBL NOT DETECTED NOT DETECTED Final   Carbapenem resistance IMP NOT DETECTED NOT DETECTED Final   Carbapenem resistance KPC NOT DETECTED NOT DETECTED Final   Carbapenem resistance NDM NOT DETECTED NOT DETECTED Final   Carbapenem resist OXA 48 LIKE NOT DETECTED NOT DETECTED Final   Carbapenem resistance VIM NOT DETECTED NOT DETECTED Final    Comment: Performed at [facility], [address], [place], [place] [address]  Resp Panel by RT-PCR (Flu A&B, Covid) Nasopharyngeal Swab     Status: None    Collection Time: 05/10/20  7:45 PM   Specimen: Nasopharyngeal Swab;  Nasopharyngeal(NP) swabs in vial transport medium  Result Value Ref Range Status   SARS Coronavirus 2 by RT PCR NEGATIVE NEGATIVE Final    Comment: (NOTE) SARS-CoV-2 target nucleic acids are NOT DETECTED.  The SARS-CoV-2 RNA is generally detectable in upper respiratory specimens during the acute phase of infection. The lowest concentration of SARS-CoV-2 viral copies this assay can detect is 138 copies/mL. A negative result does not preclude SARS-Cov-2 infection and should not be used as the sole basis for treatment or other patient management decisions. A negative result may occur with  improper specimen collection/handling, submission of specimen other than nasopharyngeal swab, presence of viral mutation(s) within the areas targeted by this assay, and inadequate number of viral copies(<138 copies/mL). A negative result must be combined with clinical observations, patient history, and epidemiological information. The expected result is Negative.  Fact Sheet for Patients:  BloggerCourse.com  Fact Sheet for Healthcare Providers:  SeriousBroker.it  This test is no t yet approved or cleared by the [place] FDA and  has been authorized for detection and/or diagnosis of SARS-CoV-2 by FDA under an Emergency Use Authorization (EUA). This EUA will remain  in effect (meaning this test can be used) for the duration of the COVID-19 declaration under Section 564(b)(1) of the Act, 21 U.S.C.section 360bbb-3(b)(1), unless the authorization is terminated  or revoked sooner.       Influenza A by PCR NEGATIVE NEGATIVE Final   Influenza B by PCR NEGATIVE NEGATIVE Final    Comment: (NOTE) The Xpert Xpress SARS-CoV-2/FLU/RSV plus assay is intended as an aid in the diagnosis of influenza from Nasopharyngeal swab specimens and should not be used as a sole basis for treatment.  Nasal washings and aspirates are unacceptable for Xpert Xpress SARS-CoV-2/FLU/RSV testing.  Fact Sheet for Patients: BloggerCourse.com  Fact Sheet for Healthcare Providers: SeriousBroker.it  This test is not yet approved or cleared by the [place] FDA and has been authorized for detection and/or diagnosis of SARS-CoV-2 by FDA under an Emergency Use Authorization (EUA). This EUA will remain in effect (meaning this test can be used) for the duration of the COVID-19 declaration under Section 564(b)(1) of the Act, 21 U.S.C. section 360bbb-3(b)(1), unless the authorization is terminated or revoked.  Performed at [facility], [address] [place], [place] [address]   Culture, blood (Routine x 2)     Status: Abnormal   Collection Time: 05/10/20  9:06 PM   Specimen: BLOOD  Result Value Ref Range Status   Specimen Description BLOOD SITE NOT SPECIFIED  Final   Special Requests   Final    BOTTLES DRAWN AEROBIC AND ANAEROBIC Blood Culture results may not be optimal due to an inadequate volume of blood received in culture bottles   Culture  Setup Time (A)  Final    GRAM VARIABLE ROD IN BOTH AEROBIC AND ANAEROBIC BOTTLES CRITICAL VALUE NOTED.  VALUE IS CONSISTENT WITH PREVIOUSLY REPORTED AND CALLED VALUE.    Culture (A)  Final    PROTEUS MIRABILIS SUSCEPTIBILITIES PERFORMED ON PREVIOUS CULTURE WITHIN THE LAST 5 DAYS. Performed at [facility], [address] [place], [place] [address]    Report Status 05/13/2020 FINAL  Final  Urine Culture     Status: Abnormal   Collection Time: 05/11/20  2:07 AM   Specimen: Urine, Cystoscope  Result Value Ref Range Status   Specimen Description CYSTOSCOPY URINE, CATHETERIZED  Final   Special Requests   Final    PATIENT ON FOLLOWING MAXAPIME Performed  at [facility], [address], [place], [place] [address]    Culture >=100,000 COLONIES/mL PROTEUS MIRABILIS (A)  Final    Report Status 05/13/2020 FINAL  Final   Organism ID, Bacteria PROTEUS MIRABILIS (A)  Final      Susceptibility   Proteus mirabilis - MIC*    AMPICILLIN <=2 SENSITIVE Sensitive     CEFAZOLIN 8 SENSITIVE Sensitive     CEFEPIME <=0.12 SENSITIVE Sensitive     CEFTAZIDIME <=1 SENSITIVE Sensitive     CEFTRIAXONE <=0.25 SENSITIVE Sensitive     CIPROFLOXACIN >=4 RESISTANT Resistant     GENTAMICIN <=1 SENSITIVE Sensitive     IMIPENEM 2 SENSITIVE Sensitive     TRIMETH/SULFA <=20 SENSITIVE Sensitive     AMPICILLIN/SULBACTAM <=2 SENSITIVE Sensitive     PIP/TAZO <=4 SENSITIVE Sensitive     * >=100,000 COLONIES/mL PROTEUS MIRABILIS  MRSA PCR Screening     Status: None   Collection Time: 05/11/20  5:37 AM   Specimen: Nasal Mucosa; Nasopharyngeal  Result Value Ref Range Status   MRSA by PCR NEGATIVE NEGATIVE Final    Comment:        The GeneXpert MRSA Assay (FDA approved for NASAL specimens only), is one component of a comprehensive MRSA colonization surveillance program. It is not intended to diagnose MRSA infection nor to guide or monitor treatment for MRSA infections. Performed at [facility], [address], [place], [place] [address]   Culture, blood (routine x 2)     Status: None (Preliminary result)   Collection Time: 05/12/20 10:50 AM   Specimen: BLOOD  Result Value Ref Range Status   Specimen Description BLOOD LEFT ANTECUBITAL  Final   Special Requests   Final    BOTTLES DRAWN AEROBIC AND ANAEROBIC Blood Culture adequate volume   Culture   Final    NO GROWTH 2 DAYS Performed at [facility], [address] [place], [place] [address]    Report Status PENDING  Incomplete  Culture, blood (routine x 2)     Status: None (Preliminary result)   Collection Time: 05/12/20 10:55 AM   Specimen: BLOOD  Result Value Ref Range Status   Specimen Description BLOOD LEFT ANTECUBITAL  Final   Special Requests   Final    BOTTLES DRAWN AEROBIC AND ANAEROBIC Blood Culture adequate  volume   Culture   Final    NO GROWTH 2 DAYS Performed at [facility], [address] [place], [place] [address]    Report Status PENDING  Incomplete     Radiology Studies: MR BRAIN WO CONTRAST  Result Date: 05/14/2020 CLINICAL DATA:  Neuro deficit, acute, stroke suspected. EXAM: MRI HEAD WITHOUT CONTRAST TECHNIQUE: Multiplanar, multiecho pulse sequences of the brain and surrounding structures were obtained without intravenous contrast. COMPARISON:  Prior head CT examinations 05/10/2020 and earlier. FINDINGS: Brain: Mild cerebral and cerebellar atrophy. As demonstrated previously, there are multiple chronic small-vessel infarcts within the bilateral corona radiata and deep gray nuclei. Associated subtle ex vacuo dilatation of the right lateral ventricle. Microhemorrhage within the lower pons. There is no acute infarct. No evidence of intracranial mass. No extra-axial fluid collection. No midline shift. Vascular: Expected proximal arterial flow voids. Skull and upper cervical spine: No focal marrow lesion. Sinuses/Orbits: Visualized orbits show no acute finding. Minimal bilateral ethmoid sinus mucosal thickening. Other: Trace fluid within the bilateral mastoid air cells. IMPRESSION: No evidence of acute intracranial abnormality, including acute infarction. As demonstrated previously, there are multiple chronic  small-vessel infarcts within the bilateral corona radiata and deep gray nuclei. Mild generalized parenchymal atrophy. Trace bilateral mastoid effusions. Electronically Signed   By: Jackey Loge DO   On: 05/14/2020 13:03   EEG adult  Result Date: 05/14/2020 Charlsie Quest, MD     05/14/2020  8:47 AM Patient Name: Shawnee Gambone MRN: [id number] Epilepsy Attending: Charlsie Quest Referring Physician/Provider: Dr Baldwin [place] Date: 05/13/2020 Duration: 28.18 mins Patient history: 55 year old female with altered mental status.  EEG to evaluate for seizures. Level of alertness: Awake, asleep  AEDs during EEG study: Keppra Technical aspects: This EEG study was done with scalp electrodes positioned according to the 10-20 International system of electrode placement. Electrical activity was acquired at a sampling rate of 500Hz  and reviewed with a high frequency filter of 70Hz  and a low frequency filter of 1Hz . EEG data were recorded continuously and digitally stored. Description: The posterior dominant rhythm consists of 8-9 Hz activity of moderate voltage (25-35 uV) seen predominantly in posterior head regions, symmetric and reactive to eye opening and eye closing. Sleep was characterized by vertex waves, maximal frontocentral region. EEG showed continuous generalized 3 to 6 Hz theta-delta slowing.  Photic driving was not seen during photic stimulation.  Hyperventilation was not performed.   ABNORMALITY - Continuous slow, generalized IMPRESSION: This study is suggestive of mild to moderate diffuse encephalopathy, nonspecific etiology. No seizures or epileptiform discharges were seen throughout the recording. Charlsie Quest   ECHOCARDIOGRAM COMPLETE BUBBLE STUDY  Result Date: 05/13/2020    ECHOCARDIOGRAM REPORT   Patient Name:   AMETHYST Caruth Date of Exam: 05/13/2020 Medical Rec #:  [id number]     Height:       64.0 in Accession #:    [id number]    Weight:       235.0 lb Date of Birth:  [birth year]     BSA:          2.095 m Patient Age:    55 years      BP:           136/76 mmHg Patient Gender: F             HR:           104 bpm. Exam Location:  Inpatient Procedure: 2D Echo, Cardiac Doppler, Color Doppler and Saline Contrast Bubble            Study                                 MODIFIED REPORT:  This report was modified by Thurmon Fair MD on 05/13/2020 due to comment re:                              absence of tamponade.  Indications:     I31.3 Pericardial effusion (noninflammatory)  History:         Patient has no prior history of Echocardiogram examinations.                  CHF, COPD and Stroke,  Signs/Symptoms:Bacteremia and Altered                  Mental Status; Risk Factors:Hypertension and Diabetes.                  Pericardial effusion.  Sonographer:     Sheralyn Boatman RDCS Referring Phys:  [id number] [facility] Diagnosing Phys: Thurmon FairMihai Croitoru MD  Sonographer Comments: Technically difficult study due to poor echo windows and patient is morbidly obese. Image acquisition challenging due to patient body habitus. Supine, could not turn. Patient moaning throughout test. Very difficult windows. IMPRESSIONS  1. Left ventricular ejection fraction, by estimation, is 60 to 65%. The left ventricle has normal function. The left ventricle has no regional wall motion abnormalities. Indeterminate diastolic filling due to E-A fusion.  2. Right ventricular systolic function is normal. The right ventricular size is normal.  3. Moderate pericardial effusion. The pericardial effusion is circumferential, but mostly posterior to the left ventricle.     There is no evidence of pericardial tamponade  4. The mitral valve is normal in structure. No evidence of mitral valve regurgitation. No evidence of mitral stenosis.  5. The aortic valve is normal in structure. Aortic valve regurgitation is not visualized. No aortic stenosis is present.  6. The inferior vena cava is normal in size with greater than 50% respiratory variability, suggesting right atrial pressure of 3 mmHg.  7. Agitated saline contrast bubble study was negative, with no evidence of any interatrial shunt. FINDINGS  Left Ventricle: Left ventricular ejection fraction, by estimation, is 60 to 65%. The left ventricle has normal function. The left ventricle has no regional wall motion abnormalities. The left ventricular internal cavity size was normal in size. There is  no left ventricular hypertrophy. Indeterminate diastolic filling due to E-A fusion. Right Ventricle: The right ventricular size is normal. No increase in right ventricular wall thickness. Right ventricular  systolic function is normal. Left Atrium: Left atrial size was normal in size. Right Atrium: Right atrial size was normal in size. Pericardium: There is no evidence of pericardial tamponade. A moderately sized pericardial effusion is present. The pericardial effusion is circumferential and posterior to the left ventricle. There is no evidence of cardiac tamponade. Mitral Valve: The mitral valve is normal in structure. No evidence of mitral valve regurgitation. No evidence of mitral valve stenosis. Tricuspid Valve: The tricuspid valve is normal in structure. Tricuspid valve regurgitation is not demonstrated. No evidence of tricuspid stenosis. Aortic Valve: The aortic valve is normal in structure. Aortic valve regurgitation is not visualized. No aortic stenosis is present. Pulmonic Valve: The pulmonic valve was normal in structure. Pulmonic valve regurgitation is not visualized. No evidence of pulmonic stenosis. Aorta: The aortic root is normal in size and structure. Venous: The inferior vena cava is normal in size with greater than 50% respiratory variability, suggesting right atrial pressure of 3 mmHg. IAS/Shunts: No atrial level shunt detected by color flow Doppler. Agitated saline contrast was given intravenously to evaluate for intracardiac shunting. Agitated saline contrast bubble study was negative, with no evidence of any interatrial shunt.  LEFT VENTRICLE PLAX 2D LVIDd:         4.50 cm     Diastology LVIDs:         2.80 cm     LV e' medial:    5.22 cm/s LV PW:         1.30 cm     LV E/e' medial:  20.7 LV IVS:        1.10 cm     LV e' lateral:   8.38 cm/s LVOT diam:     2.00 cm     LV E/e' lateral: 12.9 LV SV:         44 LV SV Index:   21 LVOT Area:     3.14  cm  LV Volumes (MOD) LV vol d, MOD A2C: 87.6 ml LV vol d, MOD A4C: 91.4 ml LV vol s, MOD A2C: 42.3 ml LV vol s, MOD A4C: 34.4 ml LV SV MOD A2C:     45.3 ml LV SV MOD A4C:     91.4 ml LV SV MOD BP:      50.1 ml RIGHT VENTRICLE             IVC RV S prime:      13.80 cm/s  IVC diam: 1.70 cm TAPSE (M-mode): 2.0 cm LEFT ATRIUM             Index       RIGHT ATRIUM           Index LA diam:        3.10 cm 1.48 cm/m  RA Area:     10.70 cm LA Vol (A2C):   53.6 ml 25.58 ml/m RA Volume:   19.70 ml  9.40 ml/m LA Vol (A4C):   20.7 ml 9.88 ml/m LA Biplane Vol: 35.3 ml 16.85 ml/m  AORTIC VALVE LVOT Vmax:   102.00 cm/s LVOT Vmean:  61.300 cm/s LVOT VTI:    0.139 m  AORTA Ao Root diam: 3.00 cm Ao Asc diam:  3.60 cm MITRAL VALVE MV Area (PHT): 5.27 cm     SHUNTS MV Decel Time: 144 msec     Systemic VTI:  0.14 m MV E velocity: 108.00 cm/s  Systemic Diam: 2.00 cm Rachelle Hora Croitoru MD Electronically signed by Thurmon Fair MD Signature Date/Time: 05/13/2020/11:37:41 AM    Final (Updated)      LOS: 3 days   Baldwin Jamaica, MD [facility]  05/14/2020, 6:16 PM

## 2020-05-14 NOTE — Progress Notes (Signed)
Subjective: No new complaints  Antibiotics:  Anti-infectives (From admission, onward)   Start     Dose/Rate Route Frequency Ordered Stop   05/14/20 1200  ampicillin (OMNIPEN) 2 g in sodium chloride 0.9 % 100 mL IVPB        2 g 300 mL/hr over 20 Minutes Intravenous Every 4 hours 05/13/20 1403     05/12/20 1500  cefTRIAXone (ROCEPHIN) 2 g in sodium chloride 0.9 % 100 mL IVPB  Status:  Discontinued        2 g 200 mL/hr over 30 Minutes Intravenous Every 24 hours 05/12/20 1042 05/13/20 1403   05/11/20 2100  ceFEPIme (MAXIPIME) 2 g in sodium chloride 0.9 % 100 mL IVPB  Status:  Discontinued        2 g 200 mL/hr over 30 Minutes Intravenous Every 24 hours 05/10/20 2046 05/11/20 1425   05/11/20 1515  ceFEPIme (MAXIPIME) 2 g in sodium chloride 0.9 % 100 mL IVPB  Status:  Discontinued        2 g 200 mL/hr over 30 Minutes Intravenous Every 12 hours 05/11/20 1425 05/12/20 1042   05/11/20 0200  cefOXitin (MEFOXIN) 2 g in sodium chloride 0.9 % 100 mL IVPB        2 g 200 mL/hr over 30 Minutes Intravenous  Once 05/11/20 0150 05/11/20 0232   05/10/20 2100  vancomycin (VANCOREADY) IVPB 2000 mg/400 mL        2,000 mg 200 mL/hr over 120 Minutes Intravenous  Once 05/10/20 2046 05/11/20 0011   05/10/20 2045  vancomycin variable dose per unstable renal function (pharmacist dosing)  Status:  Discontinued         Does not apply See admin instructions 05/10/20 2046 05/12/20 1042   05/10/20 1930  ceFEPIme (MAXIPIME) 2 g in sodium chloride 0.9 % 100 mL IVPB  Status:  Discontinued        2 g 200 mL/hr over 30 Minutes Intravenous  Once 05/10/20 1921 05/12/20 1042   05/10/20 1930  metroNIDAZOLE (FLAGYL) IVPB 500 mg        500 mg 100 mL/hr over 60 Minutes Intravenous  Once 05/10/20 1921 05/10/20 2111   05/10/20 1930  vancomycin (VANCOREADY) IVPB 1000 mg/200 mL  Status:  Discontinued        1,000 mg 200 mL/hr over 60 Minutes Intravenous  Once 05/10/20 1921 05/10/20 2046      Medications: Scheduled  Meds: . atorvastatin  40 mg Oral Daily  . Chlorhexidine Gluconate Cloth  6 each Topical Daily  . feeding supplement (PROSource TF)  45 mL Per Tube Daily  . insulin aspart  0-5 Units Subcutaneous QHS  . insulin aspart  0-9 Units Subcutaneous TID WC  . sodium chloride flush  10-40 mL Intracatheter Q12H   Continuous Infusions: . ampicillin (OMNIPEN) IV 2 g (05/14/20 1201)  . feeding supplement (OSMOLITE 1.5 CAL) 1,000 mL (05/13/20 1750)  . levETIRAcetam 500 mg (05/14/20 0513)  . potassium PHOSPHATE IVPB (in mmol) 20 mmol (05/14/20 1148)   PRN Meds:.melatonin, morphine injection, sodium chloride flush    Objective: Weight change:   Intake/Output Summary (Last 24 hours) at 05/14/2020 1315 Last data filed at 05/14/2020 0950 Gross per 24 hour  Intake 1518.5 ml  Output 4200 ml  Net -2681.5 ml   Blood pressure 133/90, pulse 96, temperature 98.8 F (37.1 C), temperature source Axillary, resp. rate (!) 25, height  (1.626 m), weight 112.8 kg, SpO2 97 %. Temp:  [98.8  F (37.1 C)-99.2 F (37.3 C)] 98.8 F (37.1 C) (04/20 1156) Pulse Rate:  [93-99] 96 (04/20 1156) Resp:  [22-25] 25 (04/20 1156) BP: (133-153)/(70-90) 133/90 (04/20 1156) SpO2:  [91 %-97 %] 97 % (04/20 1156) Weight:  [112.8 kg] 112.8 kg (04/20 0500)  Physical Exam: Physical Exam Constitutional:      General: She is not in acute distress.    Appearance: She is well-developed. She is obese. She is not diaphoretic.  HENT:     Head: Normocephalic and atraumatic.     Right Ear: External ear normal.     Left Ear: External ear normal.     Mouth/Throat:     Pharynx: No oropharyngeal exudate.  Eyes:     General: No scleral icterus.    Conjunctiva/sclera: Conjunctivae normal.     Pupils: Pupils are equal, round, and reactive to light.  Cardiovascular:     Rate and Rhythm: Normal rate and regular rhythm.     Heart sounds: Normal heart sounds. No murmur heard. No friction rub. No gallop.   Pulmonary:     Effort:  Pulmonary effort is normal. No respiratory distress.     Breath sounds: Normal breath sounds. No wheezing or rales.  Abdominal:     General: Bowel sounds are normal. There is no distension.     Palpations: Abdomen is soft.     Tenderness: There is no abdominal tenderness. There is no rebound.  Musculoskeletal:        General: No tenderness. Normal range of motion.  Lymphadenopathy:     Cervical: No cervical adenopathy.  Skin:    General: Skin is warm and dry.     Coloration: Skin is pale.     Findings: No erythema or rash.  Neurological:     Mental Status: She is alert.     Motor: No abnormal muscle tone.     Coordination: Coordination normal.  Psychiatric:        Mood and Affect: Affect is flat.        Speech: Speech is delayed.      CBC:    BMET Recent Labs    05/13/20 0500 05/14/20 0233  NA 141 138  K 3.7 3.9  CL 109 105  CO2 24 25  GLUCOSE 176* 245*  BUN 24* 14  CREATININE 1.16* 0.85  CALCIUM 8.2* 7.8*     Liver Panel  Recent Labs    05/13/20 0746  PROT 5.3*  ALBUMIN 2.3*  AST 34  ALT 29  ALKPHOS 161*  BILITOT 0.5  BILIDIR 0.2  IBILI 0.3       Sedimentation Rate No results for input(s): ESRSEDRATE in the last 72 hours. C-Reactive Protein No results for input(s): CRP in the last 72 hours.  Micro Results: Recent Results (from the past 720 hour(s))  Culture, blood (Routine x 2)     Status: Abnormal   Collection Time: 05/10/20  7:20 PM   Specimen: BLOOD  Result Value Ref Range Status   Specimen Description BLOOD SITE NOT SPECIFIED  Final   Special Requests   Final    BOTTLES DRAWN AEROBIC AND ANAEROBIC Blood Culture results may not be optimal due to an inadequate volume of blood received in culture bottles   Culture  Setup Time (A)  Final    GRAM VARIABLE ROD IN BOTH AEROBIC AND ANAEROBIC BOTTLES CRITICAL RESULT CALLED TO, READ BACK BY AND VERIFIED WITH: PHARMD JESSICA M. [id number] FCP Performed at [facility], [address] [place], [place] [address]    Culture PROTEUS MIRABILIS (A)  Final   Report Status 05/13/2020 FINAL  Final   Organism ID, Bacteria PROTEUS MIRABILIS  Final      Susceptibility   Proteus mirabilis - MIC*    AMPICILLIN <=2 SENSITIVE Sensitive     CEFAZOLIN 8 SENSITIVE Sensitive     CEFEPIME <=0.12 SENSITIVE Sensitive     CEFTAZIDIME <=1 SENSITIVE Sensitive     CEFTRIAXONE <=0.25 SENSITIVE Sensitive     CIPROFLOXACIN >=4 RESISTANT Resistant     GENTAMICIN <=1 SENSITIVE Sensitive     IMIPENEM 2 SENSITIVE Sensitive     TRIMETH/SULFA <=20 SENSITIVE Sensitive     AMPICILLIN/SULBACTAM <=2 SENSITIVE Sensitive     PIP/TAZO <=4 SENSITIVE Sensitive     * PROTEUS MIRABILIS  Blood Culture ID Panel (Reflexed)     Status: Abnormal   Collection Time: 05/10/20  7:20 PM  Result Value Ref Range Status   Enterococcus faecalis NOT DETECTED NOT DETECTED Final   Enterococcus Faecium NOT DETECTED NOT DETECTED Final   Listeria monocytogenes NOT DETECTED NOT DETECTED Final   Staphylococcus species NOT DETECTED NOT DETECTED Final   Staphylococcus aureus (BCID) NOT DETECTED NOT DETECTED Final   Staphylococcus epidermidis NOT DETECTED NOT DETECTED Final   Staphylococcus lugdunensis NOT DETECTED NOT DETECTED Final   Streptococcus species NOT DETECTED NOT DETECTED Final   Streptococcus agalactiae NOT DETECTED NOT DETECTED Final   Streptococcus pneumoniae NOT DETECTED NOT DETECTED Final   Streptococcus pyogenes NOT DETECTED NOT DETECTED Final   A.calcoaceticus-baumannii NOT DETECTED NOT DETECTED Final   Bacteroides fragilis NOT DETECTED NOT DETECTED Final   Enterobacterales DETECTED (A) NOT DETECTED Final    Comment: Enterobacterales represent a large order of gram negative bacteria, not a single organism. CRITICAL RESULT CALLED TO, READ BACK BY AND VERIFIED WITH: PHARMD JESSICA M. [id number] [phone] FCP    Enterobacter cloacae complex NOT DETECTED NOT DETECTED Final   Escherichia coli NOT DETECTED NOT DETECTED  Final   Klebsiella aerogenes NOT DETECTED NOT DETECTED Final   Klebsiella oxytoca NOT DETECTED NOT DETECTED Final   Klebsiella pneumoniae NOT DETECTED NOT DETECTED Final   Proteus species DETECTED (A) NOT DETECTED Final    Comment: CRITICAL RESULT CALLED TO, READ BACK BY AND VERIFIED WITH: PHARMD JESSICA M. 1207 [phone] FCP    Salmonella species NOT DETECTED NOT DETECTED Final   Serratia marcescens NOT DETECTED NOT DETECTED Final   Haemophilus influenzae NOT DETECTED NOT DETECTED Final   Neisseria meningitidis NOT DETECTED NOT DETECTED Final   Pseudomonas aeruginosa NOT DETECTED NOT DETECTED Final   Stenotrophomonas maltophilia NOT DETECTED NOT DETECTED Final   Candida albicans NOT DETECTED NOT DETECTED Final   Candida auris NOT DETECTED NOT DETECTED Final   Candida glabrata NOT DETECTED NOT DETECTED Final   Candida krusei NOT DETECTED NOT DETECTED Final   Candida parapsilosis NOT DETECTED NOT DETECTED Final   Candida tropicalis NOT DETECTED NOT DETECTED Final   Cryptococcus neoformans/gattii NOT DETECTED NOT DETECTED Final   CTX-M ESBL NOT DETECTED NOT DETECTED Final   Carbapenem resistance IMP NOT DETECTED NOT DETECTED Final   Carbapenem resistance KPC NOT DETECTED NOT DETECTED Final   Carbapenem resistance NDM NOT DETECTED NOT DETECTED Final   Carbapenem resist OXA 48 LIKE NOT DETECTED NOT DETECTED Final   Carbapenem resistance VIM NOT DETECTED NOT DETECTED Final    Comment: Performed at [facility], [address] [place], [place] [address]  Resp Panel by RT-PCR (  Flu A&B, Covid) Nasopharyngeal Swab     Status: None   Collection Time: 05/10/20  7:45 PM   Specimen: Nasopharyngeal Swab; Nasopharyngeal(NP) swabs in vial transport medium  Result Value Ref Range Status   SARS Coronavirus 2 by RT PCR NEGATIVE NEGATIVE Final    Comment: (NOTE) SARS-CoV-2 target nucleic acids are NOT DETECTED.  The SARS-CoV-2 RNA is generally detectable in upper respiratory specimens during the  acute phase of infection. The lowest concentration of SARS-CoV-2 viral copies this assay can detect is 138 copies/mL. A negative result does not preclude SARS-Cov-2 infection and should not be used as the sole basis for treatment or other patient management decisions. A negative result may occur with  improper specimen collection/handling, submission of specimen other than nasopharyngeal swab, presence of viral mutation(s) within the areas targeted by this assay, and inadequate number of viral copies(<138 copies/mL). A negative result must be combined with clinical observations, patient history, and epidemiological information. The expected result is Negative.  Fact Sheet for Patients:  BloggerCourse.com  Fact Sheet for Healthcare Providers:  SeriousBroker.it  This test is no t yet approved or cleared by the [place] FDA and  has been authorized for detection and/or diagnosis of SARS-CoV-2 by FDA under an Emergency Use Authorization (EUA). This EUA will remain  in effect (meaning this test can be used) for the duration of the COVID-19 declaration under Section 564(b)(1) of the Act, 21 U.S.C.section 360bbb-3(b)(1), unless the authorization is terminated  or revoked sooner.       Influenza A by PCR NEGATIVE NEGATIVE Final   Influenza B by PCR NEGATIVE NEGATIVE Final    Comment: (NOTE) The Xpert Xpress SARS-CoV-2/FLU/RSV plus assay is intended as an aid in the diagnosis of influenza from Nasopharyngeal swab specimens and should not be used as a sole basis for treatment. Nasal washings and aspirates are unacceptable for Xpert Xpress SARS-CoV-2/FLU/RSV testing.  Fact Sheet for Patients: BloggerCourse.com  Fact Sheet for Healthcare Providers: SeriousBroker.it  This test is not yet approved or cleared by the [place] FDA and has been authorized for detection and/or  diagnosis of SARS-CoV-2 by FDA under an Emergency Use Authorization (EUA). This EUA will remain in effect (meaning this test can be used) for the duration of the COVID-19 declaration under Section 564(b)(1) of the Act, 21 U.S.C. section 360bbb-3(b)(1), unless the authorization is terminated or revoked.  Performed at [facility], [address], [place], [place] [address]   Culture, blood (Routine x 2)     Status: Abnormal   Collection Time: 05/10/20  9:06 PM   Specimen: BLOOD  Result Value Ref Range Status   Specimen Description BLOOD SITE NOT SPECIFIED  Final   Special Requests   Final    BOTTLES DRAWN AEROBIC AND ANAEROBIC Blood Culture results may not be optimal due to an inadequate volume of blood received in culture bottles   Culture  Setup Time (A)  Final    GRAM VARIABLE ROD IN BOTH AEROBIC AND ANAEROBIC BOTTLES CRITICAL VALUE NOTED.  VALUE IS CONSISTENT WITH PREVIOUSLY REPORTED AND CALLED VALUE.    Culture (A)  Final    PROTEUS MIRABILIS SUSCEPTIBILITIES PERFORMED ON PREVIOUS CULTURE WITHIN THE LAST 5 DAYS. Performed at [facility], [address], [place], [place] [address]    Report Status 05/13/2020 FINAL  Final  Urine Culture     Status: Abnormal   Collection Time: 05/11/20  2:07 AM   Specimen: Urine, Cystoscope  Result Value Ref Range  Status   Specimen Description CYSTOSCOPY URINE, CATHETERIZED  Final   Special Requests   Final    PATIENT ON FOLLOWING MAXAPIME Performed at [facility], [address], [place], [place] [address]    Culture >=100,000 COLONIES/mL PROTEUS MIRABILIS (A)  Final   Report Status 05/13/2020 FINAL  Final   Organism ID, Bacteria PROTEUS MIRABILIS (A)  Final      Susceptibility   Proteus mirabilis - MIC*    AMPICILLIN <=2 SENSITIVE Sensitive     CEFAZOLIN 8 SENSITIVE Sensitive     CEFEPIME <=0.12 SENSITIVE Sensitive     CEFTAZIDIME <=1 SENSITIVE Sensitive     CEFTRIAXONE <=0.25 SENSITIVE Sensitive     CIPROFLOXACIN  >=4 RESISTANT Resistant     GENTAMICIN <=1 SENSITIVE Sensitive     IMIPENEM 2 SENSITIVE Sensitive     TRIMETH/SULFA <=20 SENSITIVE Sensitive     AMPICILLIN/SULBACTAM <=2 SENSITIVE Sensitive     PIP/TAZO <=4 SENSITIVE Sensitive     * >=100,000 COLONIES/mL PROTEUS MIRABILIS  MRSA PCR Screening     Status: None   Collection Time: 05/11/20  5:37 AM   Specimen: Nasal Mucosa; Nasopharyngeal  Result Value Ref Range Status   MRSA by PCR NEGATIVE NEGATIVE Final    Comment:        The GeneXpert MRSA Assay (FDA approved for NASAL specimens only), is one component of a comprehensive MRSA colonization surveillance program. It is not intended to diagnose MRSA infection nor to guide or monitor treatment for MRSA infections. Performed at [facility], [address], [place], [place] [address]   Culture, blood (routine x 2)     Status: None (Preliminary result)   Collection Time: 05/12/20 10:50 AM   Specimen: BLOOD  Result Value Ref Range Status   Specimen Description BLOOD LEFT ANTECUBITAL  Final   Special Requests   Final    BOTTLES DRAWN AEROBIC AND ANAEROBIC Blood Culture adequate volume   Culture   Final    NO GROWTH 2 DAYS Performed at [facility], [address] [place], [place] [address]    Report Status PENDING  Incomplete  Culture, blood (routine x 2)     Status: None (Preliminary result)   Collection Time: 05/12/20 10:55 AM   Specimen: BLOOD  Result Value Ref Range Status   Specimen Description BLOOD LEFT ANTECUBITAL  Final   Special Requests   Final    BOTTLES DRAWN AEROBIC AND ANAEROBIC Blood Culture adequate volume   Culture   Final    NO GROWTH 2 DAYS Performed at [facility], [address], [place], [place] [address]    Report Status PENDING  Incomplete    Studies/Results: MR BRAIN WO CONTRAST  Result Date: 05/14/2020 CLINICAL DATA:  Neuro deficit, acute, stroke suspected. EXAM: MRI HEAD WITHOUT CONTRAST TECHNIQUE: Multiplanar, multiecho  pulse sequences of the brain and surrounding structures were obtained without intravenous contrast. COMPARISON:  Prior head CT examinations 05/10/2020 and earlier. FINDINGS: Brain: Mild cerebral and cerebellar atrophy. As demonstrated previously, there are multiple chronic small-vessel infarcts within the bilateral corona radiata and deep gray nuclei. Associated subtle ex vacuo dilatation of the right lateral ventricle. Microhemorrhage within the lower pons. There is no acute infarct. No evidence of intracranial mass. No extra-axial fluid collection. No midline shift. Vascular: Expected proximal arterial flow voids. Skull and upper cervical spine: No focal marrow lesion. Sinuses/Orbits: Visualized orbits show no acute finding. Minimal bilateral ethmoid sinus mucosal thickening. Other: Trace fluid  within the bilateral mastoid air cells. IMPRESSION: No evidence of acute intracranial abnormality, including acute infarction. As demonstrated previously, there are multiple chronic small-vessel infarcts within the bilateral corona radiata and deep gray nuclei. Mild generalized parenchymal atrophy. Trace bilateral mastoid effusions. Electronically Signed   By: Jackey Loge DO   On: 05/14/2020 13:03   EEG adult  Result Date: 05/14/2020 Charlsie Quest, MD     05/14/2020  8:47 AM Patient Name: Julie Donovan MRN: [id number] Epilepsy Attending: Charlsie Quest Referring Physician/Provider: Dr Baldwin [place] Date: 05/13/2020 Duration: 28.18 mins Patient history: 55 year old female with altered mental status.  EEG to evaluate for seizures. Level of alertness: Awake, asleep AEDs during EEG study: Keppra Technical aspects: This EEG study was done with scalp electrodes positioned according to the 10-20 International system of electrode placement. Electrical activity was acquired at a sampling rate of 500Hz  and reviewed with a high frequency filter of 70Hz  and a low frequency filter of 1Hz . EEG data were recorded continuously and  digitally stored. Description: The posterior dominant rhythm consists of 8-9 Hz activity of moderate voltage (25-35 uV) seen predominantly in posterior head regions, symmetric and reactive to eye opening and eye closing. Sleep was characterized by vertex waves, maximal frontocentral region. EEG showed continuous generalized 3 to 6 Hz theta-delta slowing.  Photic driving was not seen during photic stimulation.  Hyperventilation was not performed.   ABNORMALITY - Continuous slow, generalized IMPRESSION: This study is suggestive of mild to moderate diffuse encephalopathy, nonspecific etiology. No seizures or epileptiform discharges were seen throughout the recording.   ECHOCARDIOGRAM COMPLETE BUBBLE STUDY  Result Date: 05/13/2020    ECHOCARDIOGRAM REPORT   Patient Name:   Julie Donovan Date of Exam: 05/13/2020 Medical Rec #:  05/15/2020     Height:       64.0 in Accession #:    [id number] [id number]    Weight:       235.0 lb Date of Birth:  1965/12/05     BSA:          2.095 m Patient Age:    55 years      BP:           136/76 mmHg Patient Gender: F             HR:           104 bpm. Exam Location:  Inpatient Procedure: 2D Echo, Cardiac Doppler, Color Doppler and Saline Contrast Bubble            Study                                 MODIFIED REPORT:  This report was modified by [id number] MD on 05/13/2020 due to comment re:                              absence of tamponade.  Indications:     I31.3 Pericardial effusion (noninflammatory)  History:         Patient has no prior history of Echocardiogram examinations.                  CHF, COPD and Stroke, Signs/Symptoms:Bacteremia and Altered                  Mental Status; Risk Factors:Hypertension and Diabetes.  Pericardial effusion.  Sonographer:     Sheralyn Boatman RDCS Referring Phys:  [id number] Baldwin [place] Diagnosing Phys: Thurmon Fair MD  Sonographer Comments: Technically difficult study due to poor echo windows and patient is morbidly obese.  Image acquisition challenging due to patient body habitus. Supine, could not turn. Patient moaning throughout test. Very difficult windows. IMPRESSIONS  1. Left ventricular ejection fraction, by estimation, is 60 to 65%. The left ventricle has normal function. The left ventricle has no regional wall motion abnormalities. Indeterminate diastolic filling due to E-A fusion.  2. Right ventricular systolic function is normal. The right ventricular size is normal.  3. Moderate pericardial effusion. The pericardial effusion is circumferential, but mostly posterior to the left ventricle.     There is no evidence of pericardial tamponade  4. The mitral valve is normal in structure. No evidence of mitral valve regurgitation. No evidence of mitral stenosis.  5. The aortic valve is normal in structure. Aortic valve regurgitation is not visualized. No aortic stenosis is present.  6. The inferior vena cava is normal in size with greater than 50% respiratory variability, suggesting right atrial pressure of 3 mmHg.  7. Agitated saline contrast bubble study was negative, with no evidence of any interatrial shunt. FINDINGS  Left Ventricle: Left ventricular ejection fraction, by estimation, is 60 to 65%. The left ventricle has normal function. The left ventricle has no regional wall motion abnormalities. The left ventricular internal cavity size was normal in size. There is  no left ventricular hypertrophy. Indeterminate diastolic filling due to E-A fusion. Right Ventricle: The right ventricular size is normal. No increase in right ventricular wall thickness. Right ventricular systolic function is normal. Left Atrium: Left atrial size was normal in size. Right Atrium: Right atrial size was normal in size. Pericardium: There is no evidence of pericardial tamponade. A moderately sized pericardial effusion is present. The pericardial effusion is circumferential and posterior to the left ventricle. There is no evidence of cardiac  tamponade. Mitral Valve: The mitral valve is normal in structure. No evidence of mitral valve regurgitation. No evidence of mitral valve stenosis. Tricuspid Valve: The tricuspid valve is normal in structure. Tricuspid valve regurgitation is not demonstrated. No evidence of tricuspid stenosis. Aortic Valve: The aortic valve is normal in structure. Aortic valve regurgitation is not visualized. No aortic stenosis is present. Pulmonic Valve: The pulmonic valve was normal in structure. Pulmonic valve regurgitation is not visualized. No evidence of pulmonic stenosis. Aorta: The aortic root is normal in size and structure. Venous: The inferior vena cava is normal in size with greater than 50% respiratory variability, suggesting right atrial pressure of 3 mmHg. IAS/Shunts: No atrial level shunt detected by color flow Doppler. Agitated saline contrast was given intravenously to evaluate for intracardiac shunting. Agitated saline contrast bubble study was negative, with no evidence of any interatrial shunt.  LEFT VENTRICLE PLAX 2D LVIDd:         4.50 cm     Diastology LVIDs:         2.80 cm     LV e' medial:    5.22 cm/s LV PW:         1.30 cm     LV E/e' medial:  20.7 LV IVS:        1.10 cm     LV e' lateral:   8.38 cm/s LVOT diam:     2.00 cm     LV E/e' lateral: 12.9 LV SV:  44 LV SV Index:   21 LVOT Area:     3.14 cm  LV Volumes (MOD) LV vol d, MOD A2C: 87.6 ml LV vol d, MOD A4C: 91.4 ml LV vol s, MOD A2C: 42.3 ml LV vol s, MOD A4C: 34.4 ml LV SV MOD A2C:     45.3 ml LV SV MOD A4C:     91.4 ml LV SV MOD BP:      50.1 ml RIGHT VENTRICLE             IVC RV S prime:     13.80 cm/s  IVC diam: 1.70 cm TAPSE (M-mode): 2.0 cm LEFT ATRIUM             Index       RIGHT ATRIUM           Index LA diam:        3.10 cm 1.48 cm/m  RA Area:     10.70 cm LA Vol (A2C):   53.6 ml 25.58 ml/m RA Volume:   19.70 ml  9.40 ml/m LA Vol (A4C):   20.7 ml 9.88 ml/m LA Biplane Vol: 35.3 ml 16.85 ml/m  AORTIC VALVE LVOT Vmax:   102.00  cm/s LVOT Vmean:  61.300 cm/s LVOT VTI:    0.139 m  AORTA Ao Root diam: 3.00 cm Ao Asc diam:  3.60 cm MITRAL VALVE MV Area (PHT): 5.27 cm     SHUNTS MV Decel Time: 144 msec     Systemic VTI:  0.14 m MV E velocity: 108.00 cm/s  Systemic Diam: 2.00 cm Mihai Croitoru MD Electronically signed by Thurmon Fair MD Signature Date/Time: 05/13/2020/11:37:41 AM    Final (Updated)       Assessment/Plan:  INTERVAL HISTORY:    Patient progressing   Active Problems:   Sepsis (HCC)   Hydronephrosis with renal and ureteral calculus obstruction   Leukocytosis   Lactic acidosis   Hypoalbuminemia   Hyperglycemia   Acute kidney injury superimposed on CKD (HCC)   S/P cystoscopy with ureteral stent placement   Hydronephrosis with obstructing calculus    Julie Donovan is a 55 y.o. female with history of stroke right-sided weakness aphasia who was admitted with encephalopathy and shock resuscitated found to have bilateral hydronephrosis status post cystoscopy with placement of stents by urology with worsening hypotension postoperatively.  She is growing Proteus mirabilis from urine and from her blood cultures  That is pan sensitive  We have switched to IV ampicillin and if taking po change to amoxicillin po or per feeding tube to get her through her lithotripsy surgery.  Jun 03, 2020  I will make sure she also has a followup appt with me or one of my partners between this hospitalization and then as well   LOS: 3 days   Acey Lav 05/14/2020, 1:15 PM

## 2020-05-15 DIAGNOSIS — N179 Acute kidney failure, unspecified: Secondary | ICD-10-CM | POA: Diagnosis not present

## 2020-05-15 DIAGNOSIS — E8809 Other disorders of plasma-protein metabolism, not elsewhere classified: Secondary | ICD-10-CM | POA: Diagnosis not present

## 2020-05-15 DIAGNOSIS — A419 Sepsis, unspecified organism: Secondary | ICD-10-CM | POA: Diagnosis not present

## 2020-05-15 DIAGNOSIS — A498 Other bacterial infections of unspecified site: Secondary | ICD-10-CM

## 2020-05-15 DIAGNOSIS — N132 Hydronephrosis with renal and ureteral calculous obstruction: Secondary | ICD-10-CM | POA: Diagnosis not present

## 2020-05-15 DIAGNOSIS — R739 Hyperglycemia, unspecified: Secondary | ICD-10-CM | POA: Diagnosis not present

## 2020-05-15 LAB — COMPREHENSIVE METABOLIC PANEL
ALT: 34 U/L (ref 0–44)
AST: 28 U/L (ref 15–41)
Albumin: 2.3 g/dL — ABNORMAL LOW (ref 3.5–5.0)
Alkaline Phosphatase: 201 U/L — ABNORMAL HIGH (ref 38–126)
Anion gap: 8 (ref 5–15)
BUN: 11 mg/dL (ref 6–20)
CO2: 26 mmol/L (ref 22–32)
Calcium: 7.5 mg/dL — ABNORMAL LOW (ref 8.9–10.3)
Chloride: 101 mmol/L (ref 98–111)
Creatinine, Ser: 0.83 mg/dL (ref 0.44–1.00)
GFR, Estimated: >60 mL/min (ref >60)
Glucose, Bld: 314 mg/dL — ABNORMAL HIGH (ref 70–99)
Potassium: 4.3 mmol/L (ref 3.5–5.1)
Sodium: 135 mmol/L (ref 135–145)
Total Bilirubin: 0.7 mg/dL (ref 0.3–1.2)
Total Protein: 5.5 g/dL — ABNORMAL LOW (ref 6.5–8.1)

## 2020-05-15 LAB — BASIC METABOLIC PANEL
Anion gap: 7 (ref 5–15)
BUN: 11 mg/dL (ref 6–20)
CO2: 27 mmol/L (ref 22–32)
Calcium: 7.7 mg/dL — ABNORMAL LOW (ref 8.9–10.3)
Chloride: 103 mmol/L (ref 98–111)
Creatinine, Ser: 0.8 mg/dL (ref 0.44–1.00)
GFR, Estimated: >60 mL/min (ref >60)
Glucose, Bld: 320 mg/dL — ABNORMAL HIGH (ref 70–99)
Potassium: 4.2 mmol/L (ref 3.5–5.1)
Sodium: 137 mmol/L (ref 135–145)

## 2020-05-15 LAB — GLUCOSE, CAPILLARY
Glucose-Capillary: 158 mg/dL — ABNORMAL HIGH (ref 70–99)
Glucose-Capillary: 213 mg/dL — ABNORMAL HIGH (ref 70–99)
Glucose-Capillary: 254 mg/dL — ABNORMAL HIGH (ref 70–99)
Glucose-Capillary: 257 mg/dL — ABNORMAL HIGH (ref 70–99)
Glucose-Capillary: 368 mg/dL — ABNORMAL HIGH (ref 70–99)

## 2020-05-15 LAB — CBC
HCT: 35.2 % — ABNORMAL LOW (ref 36.0–46.0)
Hemoglobin: 11.3 g/dL — ABNORMAL LOW (ref 12.0–15.0)
MCH: 30.3 pg (ref 26.0–34.0)
MCHC: 32.1 g/dL (ref 30.0–36.0)
MCV: 94.4 fL (ref 80.0–100.0)
Platelets: 77 K/uL — ABNORMAL LOW (ref 150–400)
RBC: 3.73 MIL/uL — ABNORMAL LOW (ref 3.87–5.11)
RDW: 13.9 % (ref 11.5–15.5)
WBC: 15.6 K/uL — ABNORMAL HIGH (ref 4.0–10.5)
nRBC: 0 % (ref 0.0–0.2)

## 2020-05-15 MED ORDER — INSULIN ASPART 100 UNIT/ML ~~LOC~~ SOLN
5.0000 [IU] | SUBCUTANEOUS | Status: DC
  Administered 2020-05-15 – 2020-05-21 (×34): 5 [IU] via SUBCUTANEOUS

## 2020-05-15 MED ORDER — ALTEPLASE 2 MG IJ SOLR
2.0000 mg | Freq: Once | INTRAMUSCULAR | Status: AC
  Administered 2020-05-15: 2 mg
  Filled 2020-05-15: qty 2

## 2020-05-15 MED ORDER — AMOXICILLIN 250 MG/5ML PO SUSR
500.0000 mg | Freq: Three times a day (TID) | ORAL | Status: DC
  Administered 2020-05-15 – 2020-05-21 (×18): 500 mg via ORAL
  Filled 2020-05-15 (×22): qty 10

## 2020-05-15 MED ORDER — MELATONIN 3 MG PO TABS: 3.0000 mg | ORAL_TABLET | Freq: Every evening | ORAL | Status: DC | PRN

## 2020-05-15 MED ORDER — INSULIN ASPART 100 UNIT/ML ~~LOC~~ SOLN
0.0000 [IU] | SUBCUTANEOUS | Status: DC
  Administered 2020-05-15: 4 [IU] via SUBCUTANEOUS
  Administered 2020-05-15: 11 [IU] via SUBCUTANEOUS
  Administered 2020-05-16 (×2): 4 [IU] via SUBCUTANEOUS
  Administered 2020-05-16 (×3): 3 [IU] via SUBCUTANEOUS
  Administered 2020-05-17: 4 [IU] via SUBCUTANEOUS
  Administered 2020-05-17 (×2): 7 [IU] via SUBCUTANEOUS
  Administered 2020-05-17: 4 [IU] via SUBCUTANEOUS
  Administered 2020-05-17: 7 [IU] via SUBCUTANEOUS
  Administered 2020-05-17: 11 [IU] via SUBCUTANEOUS
  Administered 2020-05-18 (×3): 7 [IU] via SUBCUTANEOUS
  Administered 2020-05-18 (×3): 11 [IU] via SUBCUTANEOUS
  Administered 2020-05-19 (×4): 7 [IU] via SUBCUTANEOUS
  Administered 2020-05-19: 4 [IU] via SUBCUTANEOUS
  Administered 2020-05-19: 7 [IU] via SUBCUTANEOUS
  Administered 2020-05-19: 11 [IU] via SUBCUTANEOUS
  Administered 2020-05-20: 3 [IU] via SUBCUTANEOUS
  Administered 2020-05-20: 7 [IU] via SUBCUTANEOUS
  Administered 2020-05-20 (×2): 3 [IU] via SUBCUTANEOUS
  Administered 2020-05-20 – 2020-05-21 (×2): 4 [IU] via SUBCUTANEOUS
  Administered 2020-05-21: 7 [IU] via SUBCUTANEOUS
  Administered 2020-05-21: 3 [IU] via SUBCUTANEOUS
  Administered 2020-05-21 (×2): 7 [IU] via SUBCUTANEOUS

## 2020-05-15 MED ORDER — ATORVASTATIN CALCIUM 40 MG PO TABS
40.0000 mg | ORAL_TABLET | Freq: Every day | ORAL | Status: DC
  Administered 2020-05-16 – 2020-05-22 (×7): 40 mg
  Filled 2020-05-15 (×7): qty 1

## 2020-05-15 MED ORDER — METOPROLOL TARTRATE 12.5 MG HALF TABLET
12.5000 mg | ORAL_TABLET | Freq: Two times a day (BID) | ORAL | Status: DC
  Administered 2020-05-15 – 2020-05-22 (×14): 12.5 mg
  Filled 2020-05-15 (×14): qty 1

## 2020-05-15 MED ORDER — GLUCERNA 1.5 CAL PO LIQD
1000.0000 mL | ORAL | Status: DC
  Administered 2020-05-15 – 2020-05-20 (×6): 1000 mL
  Filled 2020-05-15 (×7): qty 1000

## 2020-05-15 NOTE — Evaluation (Signed)
Physical Therapy Evaluation Patient Details Name: Julie Donovan MRN: [id number] DOB: 04/23/1965 Today's Date: 05/15/2020   History of Present Illness  Pt adm 4/16 with acute encephalopathy and septic shock from UTI. PMH - dementia, HTN, DM, lt CVA, ckd, heart failure, morbid obesity  Clinical Impression  Pt dependent with all mobility. Spoke to sister who said she had been in current facility for >1 year and had been in facilities prior to that. Sister reports the facility gets pt up to chair a few times a week. She doesn't know how they get her up but after evaluating the patient I am confident that they use a mechanical lift. Pt with no function of rt UE/LE and with bilateral plantarflexion contractures. Pt appears at baseline with mobility. Recommend return to long term care.     Follow Up Recommendations No PT follow up;Other (comment) (return to long term care)    Equipment Recommendations  None recommended by PT    Recommendations for Other Services       Precautions / Restrictions        Mobility  Bed Mobility Overal bed mobility: Needs Assistance             General bed mobility comments: Total assist to bring trunk forward from bed toward sitting position    Transfers                    Ambulation/Gait                Stairs            Wheelchair Mobility    Modified Rankin (Stroke Patients Only)       Balance                                             Pertinent Vitals/Pain Pain Assessment: Faces Faces Pain Scale: No hurt    Home Living Family/patient expects to be discharged to:: Skilled nursing facility                      Prior Function Level of Independence: Needs assistance   Gait / Transfers Assistance Needed: Per sister pt primarily bed bound. States she gets up a few times a week at the facility. Sister was unsure how they get pt up.           Hand Dominance         Extremity/Trunk Assessment   Upper Extremity Assessment Upper Extremity Assessment: RUE deficits/detail;LUE deficits/detail RUE Deficits / Details: No active movement noted. Hand contracture. Decr PROM LUE Deficits / Details: Strength grossly 2/5.    Lower Extremity Assessment Lower Extremity Assessment: RLE deficits/detail;LLE deficits/detail RLE Deficits / Details: no active movement noted. Knee flexion contracture. Plantarflexion contracture. Hip externally rotated. LLE Deficits / Details: Slight assistance with hip flexion. Active great toe extension but no active ankle movement. Plantarflexion contracture.       Communication   Communication: Other (comment) (Non verbal)  Cognition Arousal/Alertness: Awake/alert Behavior During Therapy: Flat affect Overall Cognitive Status: Difficult to assess                                 General Comments: Pt non verbal. Followed some 1 step commands with lt side      General Comments  Exercises     Assessment/Plan    PT Assessment Patent does not need any further PT services  PT Problem List         PT Treatment Interventions      PT Goals (Current goals can be found in the Care Plan section)  Acute Rehab PT Goals PT Goal Formulation: Patient unable to participate in goal setting    Frequency     Barriers to discharge        Co-evaluation               AM-PAC PT "6 Clicks" Mobility  Outcome Measure Help needed turning from your back to your side while in a flat bed without using bedrails?: Total Help needed moving from lying on your back to sitting on the side of a flat bed without using bedrails?: Total Help needed moving to and from a bed to a chair (including a wheelchair)?: Total Help needed standing up from a chair using your arms (e.g., wheelchair or bedside chair)?: Total Help needed to walk in hospital room?: Total Help needed climbing 3-5 steps with a railing? : Total 6 Click  Score: 6    End of Session   Activity Tolerance: Patient tolerated treatment well Patient left: in bed;with call bell/phone within reach   PT Visit Diagnosis: Other abnormalities of gait and mobility (R26.89)    Time: 3785-8850 PT Time Calculation (min) (ACUTE ONLY): 9 min   Charges:   PT Evaluation $PT Eval Low Complexity: 1 Low          [facility] PT Acute Rehabilitation Services Pager [phone] Office [phone]   Angelina Ok [facility] 05/15/2020, 4:09 PM

## 2020-05-15 NOTE — Progress Notes (Signed)
PROGRESS NOTE    Julie Donovan  ZOX:[id number] DOB: 1965/09/17 DOA: 05/10/2020 PCP: Karna Dupes, MD   Chief Complaint  Patient presents with  . Altered Mental Status     Brief Narrative: 55 year old female with a PMH of Dementia who is nonverbal at baseline, Hypertension, Diabetes Mellitus Type 2, Left CVA with right sided deficits, Diastolic Heart Failure, CKD3, and Morbid Obesity (BMI 40 kg/m2) who presented to the ED on 4/17 with altered mental status who quickly became unstable. She was admitted to the ICU and managed for Acute Encephalopathy and Septic Shock due to Proteus Bacteremia secondary to UTI found to have bilateral hydronephrosis s/p bilateral ureteral stent placement  Events: 4/17 Admitted to ICU for septic shock.  Bilateral Ureteral Stents placed. 4/17 Levophed was discontinued. 4/18 Transferred out of ICU  Subjective:  Patient is lethargic, does attempt to follow command, not inconsistent likely due to tiredness She was part of few words, knows she is in the hospital She is tolerating tube feeds, indwelling Foley in place, left IJ in place She has bilateral lower extremity pitting edema, she is on room air, no hypoxia  Assessment & Plan: Active Problems:   Sepsis (HCC)   Hydronephrosis with renal and ureteral calculus obstruction   Leukocytosis   Lactic acidosis   Hypoalbuminemia   Hyperglycemia   Acute kidney injury superimposed on CKD (HCC)   S/P cystoscopy with ureteral stent placement   Hydronephrosis with obstructing calculus  Acute Toxic Metabolic Encephalopathy - Acute Infection vs Neurological: - Continue antibiotics per Infectious Disease. - Continue home med Keppra 500 mg BID. - EEG showed no epileptiform activity.  - MRI showed no acute infarct or other abnormality. -Appears slightly improving  Proteus Bacteremia secondary to Obstructive Uropathy s/p Bilateral  Ureteral Stent Placement: Cultures were sensitive to Cephalosporins. - On IV  Ampicillin.  This is Day 4/24 of antibiotics post stent placement.  End date is 5/10 when she will have laser lithotripsy at Urology office outpatient. - Blood Cultures grew Proteus sensitive to Cephalosporins and resistant to Cipro. - De-escalate antibiotics to PO Amoxicillin on discharge.  - Infectious Disease and Urology are following, appreciate assistance and recommendations.  Leukocytosis, improved: - WBC decreased from 23K to 15K. - 4/18 repeat Blood Cultures are NG.  Mild Lactic Acidosis: Lactate is 2.3 mmol/L.  HR is ~ 100 beats/min.  BNP is 495 pg/mL.  EF is normal.  - received LR 150 CC/hr (106 kg) x 10 hours  On 4/20, she is on tube feeds, I do not see free water flushes - Monitor peripheral edema.  History of CVA with right sided weakness and aphasia: Patient is nonverbal at baseline. - Hold off on antiplatelets until thrombocytopenia improves.  Perform Neurochecks q4h. - Continue Atorvastatin 40 mg nightly.  Moderate Pericardial Effusion: - Echo shows no tamponade.  EF 60 to 65%  Hypokalemia: - K 3.9 mmol/L, replaced.  Hypomagnesium: - Mg 1.5 mg/dL, replaced.  Hypophosphatemia: - Ph 1.8 mg/dL, replaced.  Acute Kidney Injury, resolved: - Creatinine improved from 1.9 to 0.85 mg/dL. - Continue fluids and repeat BMP in am.  Acute Thrombocytopenia: - Platelets decreased from 237 to 77K over the last 5 days. - Check HIT panel and hold Heparin. Check venous Doppler bilateral lower extremity  Insulin-dependent Diabetes Mellitus Type 2: - Glucose was running low on 4/18 and patient was placed on Dextrose infusion.  Glucose is now running high.   -she is on tube feeds, consult dietitian change from Osmolite  to Glucerna tube feeds - currently Lantus 20 units nightly , may need to change to bid if remains hyperglycemic -Schedule NovoLog 5 units every 4 hours, with SSI every 4 hours   Chronic COPD, not in acute exacberbation: - Lung exam is stable. - Continue nebulizers  PRN.  Essential Hypertension: - BP is stable. - Continue Lopressor 12.5 mg BID.  Moderate Protein Calore Malnutrition: - Albumin is 2.3 g/dL.  Morbid Obesity: BMI 40 kg/m2  Oropharyngeal Dysphagia: - SLP recommends NPO status. - Continue Tube Feeds. - If does not improve over the next few days, we will need to begin discussion regarding PEG tube.  Physical Deconditioning: - PT is consulted, evaluation is pending.  Lines: Left IJV placed 4/17. Foley: Foley placed 4/17. Nutrition: Tube Feeds are running since 4/17.   Diet Order            Diet NPO time specified  Diet effective now                 Nutrition Problem: Inadequate oral intake Etiology: inability to eat Signs/Symptoms: NPO status Interventions: Tube feeding,Prostat Patient's Body mass index is 42.91 kg/m.   DVT prophylaxis: Place and maintain sequential compression device Start: 05/12/20 1117 SCDs Start: 05/11/20 9622 Code Status:   Code Status: Full Code  Family Communication: unable to reach family, will attempt to call Sister Steward Drone tomorrow  Status is: Inpatient  Remains inpatient appropriate because:Inpatient level of care appropriate due to severity of illness   Dispo: The patient is from: Home              Anticipated d/c is to: SNF              Patient currently is not medically stable to d/c.   Difficult to place patient No    Unresulted Labs (From admission, onward)          Start     Ordered   05/12/20 0500  Basic metabolic panel  Daily,   R      05/11/20 1553   05/12/20 0500  CBC  Daily,   R      05/11/20 1553   Signed and Held  Comprehensive metabolic panel  Tomorrow morning,   R        Signed and Held          Medications reviewed:  Scheduled Meds: . amoxicillin  500 mg Oral Q8H  . [START ON 05/16/2020] atorvastatin  40 mg Per Tube Daily  . Chlorhexidine Gluconate Cloth  6 each Topical Daily  . feeding supplement (PROSource TF)  45 mL Per Tube Daily  . insulin aspart   0-5 Units Subcutaneous QHS  . insulin aspart  0-9 Units Subcutaneous TID WC  . insulin aspart  5 Units Subcutaneous TID WC  . insulin glargine  20 Units Subcutaneous QHS  . metoprolol tartrate  12.5 mg Per Tube BID  . sodium chloride flush  10-40 mL Intracatheter Q12H   Continuous Infusions: . feeding supplement (OSMOLITE 1.5 CAL) 1,000 mL (05/13/20 1750)  . levETIRAcetam 500 mg (05/15/20 0521)    Consultants:see note  Procedures:see note  Antimicrobials: Anti-infectives (From admission, onward)   Start     Dose/Rate Route Frequency Ordered Stop   05/15/20 1400  amoxicillin (AMOXIL) 250 MG/5ML suspension 500 mg        500 mg Oral Every 8 hours 05/15/20 1054 06/06/20 2359   05/14/20 1200  ampicillin (OMNIPEN) 2 g in sodium chloride 0.9 % 100  mL IVPB  Status:  Discontinued        2 g 300 mL/hr over 20 Minutes Intravenous Every 4 hours 05/13/20 1403 05/15/20 1054   05/12/20 1500  cefTRIAXone (ROCEPHIN) 2 g in sodium chloride 0.9 % 100 mL IVPB  Status:  Discontinued        2 g 200 mL/hr over 30 Minutes Intravenous Every 24 hours 05/12/20 1042 05/13/20 1403   05/11/20 2100  ceFEPIme (MAXIPIME) 2 g in sodium chloride 0.9 % 100 mL IVPB  Status:  Discontinued        2 g 200 mL/hr over 30 Minutes Intravenous Every 24 hours 05/10/20 2046 05/11/20 1425   05/11/20 1515  ceFEPIme (MAXIPIME) 2 g in sodium chloride 0.9 % 100 mL IVPB  Status:  Discontinued        2 g 200 mL/hr over 30 Minutes Intravenous Every 12 hours 05/11/20 1425 05/12/20 1042   05/11/20 0200  cefOXitin (MEFOXIN) 2 g in sodium chloride 0.9 % 100 mL IVPB        2 g 200 mL/hr over 30 Minutes Intravenous  Once 05/11/20 0150 05/11/20 0232   05/10/20 2100  vancomycin (VANCOREADY) IVPB 2000 mg/400 mL        2,000 mg 200 mL/hr over 120 Minutes Intravenous  Once 05/10/20 2046 05/11/20 0011   05/10/20 2045  vancomycin variable dose per unstable renal function (pharmacist dosing)  Status:  Discontinued         Does not apply See  admin instructions 05/10/20 2046 05/12/20 1042   05/10/20 1930  ceFEPIme (MAXIPIME) 2 g in sodium chloride 0.9 % 100 mL IVPB  Status:  Discontinued        2 g 200 mL/hr over 30 Minutes Intravenous  Once 05/10/20 1921 05/12/20 1042   05/10/20 1930  metroNIDAZOLE (FLAGYL) IVPB 500 mg        500 mg 100 mL/hr over 60 Minutes Intravenous  Once 05/10/20 1921 05/10/20 2111   05/10/20 1930  vancomycin (VANCOREADY) IVPB 1000 mg/200 mL  Status:  Discontinued        1,000 mg 200 mL/hr over 60 Minutes Intravenous  Once 05/10/20 1921 05/10/20 2046     Culture/Microbiology    Component Value Date/Time   SDES BLOOD LEFT ANTECUBITAL 05/12/2020 1055   SPECREQUEST  05/12/2020 1055    BOTTLES DRAWN AEROBIC AND ANAEROBIC Blood Culture adequate volume   CULT  05/12/2020 1055    NO GROWTH 3 DAYS Performed at [facility], [address], [place], [place] [address]    REPTSTATUS PENDING 05/12/2020 1055    Other culture-see note  Objective: Vitals: Today's Vitals   05/15/20 0353 05/15/20 0354 05/15/20 0801 05/15/20 0900  BP:  (!) 148/81 133/76   Pulse:  (!) 101 94   Resp:  (!) 31 (!) 23   Temp:  98.5 F (36.9 C) 97.9 F (36.6 C)   TempSrc:  Axillary Oral   SpO2:  95% 97%   Weight: 113.4 kg     Height:      PainSc:    0-No pain    Intake/Output Summary (Last 24 hours) at 05/15/2020 1343 Last data filed at 05/15/2020 0354 Gross per 24 hour  Intake 55 ml  Output 2950 ml  Net -2895 ml   Filed Weights   05/10/20 2207 05/14/20 0500 05/15/20 0353  Weight: 106.6 kg 112.8 kg 113.4 kg   Weight change: 0.6 kg  Intake/Output from previous day: 04/20 0701 - 04/21 0700 In:  55 [NG/GT:55] Out: 4250 [Urine:4250] Intake/Output this shift: No intake/output data recorded. Filed Weights   05/10/20 2207 05/14/20 0500 05/15/20 0353  Weight: 106.6 kg 112.8 kg 113.4 kg    Examination: General exam: chronically ill appearing, obese, somnolent, opens eyes to stimuli, does  follow commands,  whisper a few words, core track in place, left IJ in place, Foley in place HEENT: NCAT, PERRL Respiratory system: Diminished at bases, no wheezing, no rales, no rhonchi, not in acute respiratory distress Cardiovascular system: Distant heart sound , RRR,  Gastrointestinal system: Large body habitus, Abdomen soft, NT,ND, BS+. Nervous System: Chronic right sided deficits. Extremities:   Bilateral lower extremity pitting edema, distal peripheral pulses palpable.  Skin: No rashes,no icterus. MSK: Physical deconditioning  Data Reviewed: I have personally reviewed following labs and imaging studies CBC: Recent Labs  Lab 05/10/20 1918 05/11/20 0149 05/11/20 0713 05/11/20 1644 05/12/20 0500 05/13/20 0500 05/14/20 0233 05/15/20 0339  WBC 21.0*   < > 22.5*  --  22.5* 23.2* 15.2* 15.6*  NEUTROABS 18.6*  --   --   --   --   --   --   --   HGB 15.0   < > 11.4* 11.2* 11.3* 11.2* 11.6* 11.3*  HCT 45.1   < > 36.0 33.0* 34.5* 33.6* 35.8* 35.2*  MCV 92.2   < > 98.4  --  94.5 92.1 93.5 94.4  PLT 237   < > 110*  --  80* 79* 77* 77*   < > = values in this interval not displayed.   Basic Metabolic Panel: Recent Labs  Lab 05/11/20 0713 05/11/20 1644 05/11/20 2200 05/12/20 0500 05/12/20 2050 05/13/20 0500 05/13/20 1739 05/14/20 0233 05/15/20 0339  NA  --    < > 141 140  --  141  --  138 137  K  --    < > 4.3 3.9  --  3.7  --  3.9 4.2  CL  --   --  111 112*  --  109  --  105 103  CO2  --   --  21* 22  --  24  --  25 27  GLUCOSE  --   --  114* 87  --  176*  --  245* 320*  BUN  --   --  32* 28*  --  24*  --  14 11  CREATININE  --   --  1.67* 1.48*  --  1.16*  --  0.85 0.80  CALCIUM  --   --  7.9* 8.0*  --  8.2*  --  7.8* 7.7*  MG 1.2*  --   --   --  1.6* 1.6* 1.7 1.5*  --   PHOS 3.0  --   --   --  1.1* <1.0* 2.4* 1.8*  --    < > = values in this interval not displayed.   Liver Function Tests: Recent Labs  Lab 05/10/20 1918 05/13/20 0746  AST 29 34  ALT 37 29  ALKPHOS 96 161*   BILITOT 0.5 0.5  PROT 6.4* 5.3*  ALBUMIN 3.1* 2.3*   Coagulation Profile: Recent Labs  Lab 05/10/20 1918 05/11/20 0713  INR 1.4* 1.8*   CBG: Recent Labs  Lab 05/14/20 1149 05/14/20 1614 05/14/20 2029 05/15/20 0746 05/15/20 1144  GLUCAP 250* 308* 330* 368* 213*   Sepsis Labs: Recent Labs  Lab 05/11/20 1143 05/11/20 2200 05/13/20 0707 05/14/20 1229  LATICACIDVEN 4.7* 2.6* 2.2* 2.3*    Recent  Results (from the past 240 hour(s))  Culture, blood (Routine x 2)     Status: Abnormal   Collection Time: 05/10/20  7:20 PM   Specimen: BLOOD  Result Value Ref Range Status   Specimen Description BLOOD SITE NOT SPECIFIED  Final   Special Requests   Final    BOTTLES DRAWN AEROBIC AND ANAEROBIC Blood Culture results may not be optimal due to an inadequate volume of blood received in culture bottles   Culture  Setup Time (A)  Final    GRAM VARIABLE ROD IN BOTH AEROBIC AND ANAEROBIC BOTTLES CRITICAL RESULT CALLED TO, READ BACK BY AND VERIFIED WITH: PHARMD JESSICA M. [id number] FCP Performed at [facility], [address] [place], [place] [address]    Culture PROTEUS MIRABILIS (A)  Final   Report Status 05/13/2020 FINAL  Final   Organism ID, Bacteria PROTEUS MIRABILIS  Final      Susceptibility   Proteus mirabilis - MIC*    AMPICILLIN <=2 SENSITIVE Sensitive     CEFAZOLIN 8 SENSITIVE Sensitive     CEFEPIME <=0.12 SENSITIVE Sensitive     CEFTAZIDIME <=1 SENSITIVE Sensitive     CEFTRIAXONE <=0.25 SENSITIVE Sensitive     CIPROFLOXACIN >=4 RESISTANT Resistant     GENTAMICIN <=1 SENSITIVE Sensitive     IMIPENEM 2 SENSITIVE Sensitive     TRIMETH/SULFA <=20 SENSITIVE Sensitive     AMPICILLIN/SULBACTAM <=2 SENSITIVE Sensitive     PIP/TAZO <=4 SENSITIVE Sensitive     * PROTEUS MIRABILIS  Blood Culture ID Panel (Reflexed)     Status: Abnormal   Collection Time: 05/10/20  7:20 PM  Result Value Ref Range Status   Enterococcus faecalis NOT DETECTED NOT DETECTED Final    Enterococcus Faecium NOT DETECTED NOT DETECTED Final   Listeria monocytogenes NOT DETECTED NOT DETECTED Final   Staphylococcus species NOT DETECTED NOT DETECTED Final   Staphylococcus aureus (BCID) NOT DETECTED NOT DETECTED Final   Staphylococcus epidermidis NOT DETECTED NOT DETECTED Final   Staphylococcus lugdunensis NOT DETECTED NOT DETECTED Final   Streptococcus species NOT DETECTED NOT DETECTED Final   Streptococcus agalactiae NOT DETECTED NOT DETECTED Final   Streptococcus pneumoniae NOT DETECTED NOT DETECTED Final   Streptococcus pyogenes NOT DETECTED NOT DETECTED Final   A.calcoaceticus-baumannii NOT DETECTED NOT DETECTED Final   Bacteroides fragilis NOT DETECTED NOT DETECTED Final   Enterobacterales DETECTED (A) NOT DETECTED Final    Comment: Enterobacterales represent a large order of gram negative bacteria, not a single organism. CRITICAL RESULT CALLED TO, READ BACK BY AND VERIFIED WITH: PHARMD JESSICA M. [id number] FCP    Enterobacter cloacae complex NOT DETECTED NOT DETECTED Final   Escherichia coli NOT DETECTED NOT DETECTED Final   Klebsiella aerogenes NOT DETECTED NOT DETECTED Final   Klebsiella oxytoca NOT DETECTED NOT DETECTED Final   Klebsiella pneumoniae NOT DETECTED NOT DETECTED Final   Proteus species DETECTED (A) NOT DETECTED Final    Comment: CRITICAL RESULT CALLED TO, READ BACK BY AND VERIFIED WITH: PHARMD JESSICA M. [id number] FCP    Salmonella species NOT DETECTED NOT DETECTED Final   Serratia marcescens NOT DETECTED NOT DETECTED Final   Haemophilus influenzae NOT DETECTED NOT DETECTED Final   Neisseria meningitidis NOT DETECTED NOT DETECTED Final   Pseudomonas aeruginosa NOT DETECTED NOT DETECTED Final   Stenotrophomonas maltophilia NOT DETECTED NOT DETECTED Final   Candida albicans NOT DETECTED NOT DETECTED Final   Candida auris NOT DETECTED NOT DETECTED Final   Candida glabrata NOT  DETECTED NOT DETECTED Final   Candida krusei NOT DETECTED NOT DETECTED  Final   Candida parapsilosis NOT DETECTED NOT DETECTED Final   Candida tropicalis NOT DETECTED NOT DETECTED Final   Cryptococcus neoformans/gattii NOT DETECTED NOT DETECTED Final   CTX-M ESBL NOT DETECTED NOT DETECTED Final   Carbapenem resistance IMP NOT DETECTED NOT DETECTED Final   Carbapenem resistance KPC NOT DETECTED NOT DETECTED Final   Carbapenem resistance NDM NOT DETECTED NOT DETECTED Final   Carbapenem resist OXA 48 LIKE NOT DETECTED NOT DETECTED Final   Carbapenem resistance VIM NOT DETECTED NOT DETECTED Final    Comment: Performed at [facility], [address], [place], [place] [address]  Resp Panel by RT-PCR (Flu A&B, Covid) Nasopharyngeal Swab     Status: None   Collection Time: 05/10/20  7:45 PM   Specimen: Nasopharyngeal Swab; Nasopharyngeal(NP) swabs in vial transport medium  Result Value Ref Range Status   SARS Coronavirus 2 by RT PCR NEGATIVE NEGATIVE Final    Comment: (NOTE) SARS-CoV-2 target nucleic acids are NOT DETECTED.  The SARS-CoV-2 RNA is generally detectable in upper respiratory specimens during the acute phase of infection. The lowest concentration of SARS-CoV-2 viral copies this assay can detect is 138 copies/mL. A negative result does not preclude SARS-Cov-2 infection and should not be used as the sole basis for treatment or other patient management decisions. A negative result may occur with  improper specimen collection/handling, submission of specimen other than nasopharyngeal swab, presence of viral mutation(s) within the areas targeted by this assay, and inadequate number of viral copies(<138 copies/mL). A negative result must be combined with clinical observations, patient history, and epidemiological information. The expected result is Negative.  Fact Sheet for Patients:  BloggerCourse.com  Fact Sheet for Healthcare Providers:  SeriousBroker.it  This test is no t yet approved or  cleared by the [place] FDA and  has been authorized for detection and/or diagnosis of SARS-CoV-2 by FDA under an Emergency Use Authorization (EUA). This EUA will remain  in effect (meaning this test can be used) for the duration of the COVID-19 declaration under Section 564(b)(1) of the Act, 21 U.S.C.section 360bbb-3(b)(1), unless the authorization is terminated  or revoked sooner.       Influenza A by PCR NEGATIVE NEGATIVE Final   Influenza B by PCR NEGATIVE NEGATIVE Final    Comment: (NOTE) The Xpert Xpress SARS-CoV-2/FLU/RSV plus assay is intended as an aid in the diagnosis of influenza from Nasopharyngeal swab specimens and should not be used as a sole basis for treatment. Nasal washings and aspirates are unacceptable for Xpert Xpress SARS-CoV-2/FLU/RSV testing.  Fact Sheet for Patients: BloggerCourse.com  Fact Sheet for Healthcare Providers: SeriousBroker.it  This test is not yet approved or cleared by the [place] FDA and has been authorized for detection and/or diagnosis of SARS-CoV-2 by FDA under an Emergency Use Authorization (EUA). This EUA will remain in effect (meaning this test can be used) for the duration of the COVID-19 declaration under Section 564(b)(1) of the Act, 21 U.S.C. section 360bbb-3(b)(1), unless the authorization is terminated or revoked.  Performed at [facility], [address] [place], [place] [address]   Culture, blood (Routine x 2)     Status: Abnormal   Collection Time: 05/10/20  9:06 PM   Specimen: BLOOD  Result Value Ref Range Status   Specimen Description BLOOD SITE NOT SPECIFIED  Final   Special Requests   Final    BOTTLES DRAWN AEROBIC AND ANAEROBIC  Blood Culture results may not be optimal due to an inadequate volume of blood received in culture bottles   Culture  Setup Time (A)  Final    GRAM VARIABLE ROD IN BOTH AEROBIC AND ANAEROBIC BOTTLES CRITICAL VALUE NOTED.   VALUE IS CONSISTENT WITH PREVIOUSLY REPORTED AND CALLED VALUE.    Culture (A)  Final    PROTEUS MIRABILIS SUSCEPTIBILITIES PERFORMED ON PREVIOUS CULTURE WITHIN THE LAST 5 DAYS. Performed at [facility], [address], [place], [place] [address]    Report Status 05/13/2020 FINAL  Final  Urine Culture     Status: Abnormal   Collection Time: 05/11/20  2:07 AM   Specimen: Urine, Cystoscope  Result Value Ref Range Status   Specimen Description CYSTOSCOPY URINE, CATHETERIZED  Final   Special Requests   Final    PATIENT ON FOLLOWING MAXAPIME Performed at [facility], [address] [place], [place] [address]    Culture >=100,000 COLONIES/mL PROTEUS MIRABILIS (A)  Final   Report Status 05/13/2020 FINAL  Final   Organism ID, Bacteria PROTEUS MIRABILIS (A)  Final      Susceptibility   Proteus mirabilis - MIC*    AMPICILLIN <=2 SENSITIVE Sensitive     CEFAZOLIN 8 SENSITIVE Sensitive     CEFEPIME <=0.12 SENSITIVE Sensitive     CEFTAZIDIME <=1 SENSITIVE Sensitive     CEFTRIAXONE <=0.25 SENSITIVE Sensitive     CIPROFLOXACIN >=4 RESISTANT Resistant     GENTAMICIN <=1 SENSITIVE Sensitive     IMIPENEM 2 SENSITIVE Sensitive     TRIMETH/SULFA <=20 SENSITIVE Sensitive     AMPICILLIN/SULBACTAM <=2 SENSITIVE Sensitive     PIP/TAZO <=4 SENSITIVE Sensitive     * >=100,000 COLONIES/mL PROTEUS MIRABILIS  MRSA PCR Screening     Status: None   Collection Time: 05/11/20  5:37 AM   Specimen: Nasal Mucosa; Nasopharyngeal  Result Value Ref Range Status   MRSA by PCR NEGATIVE NEGATIVE Final    Comment:        The GeneXpert MRSA Assay (FDA approved for NASAL specimens only), is one component of a comprehensive MRSA colonization surveillance program. It is not intended to diagnose MRSA infection nor to guide or monitor treatment for MRSA infections. Performed at [facility], [address], [place], [place] [address]   Culture, blood (routine x 2)     Status: None (Preliminary  result)   Collection Time: 05/12/20 10:50 AM   Specimen: BLOOD  Result Value Ref Range Status   Specimen Description BLOOD LEFT ANTECUBITAL  Final   Special Requests   Final    BOTTLES DRAWN AEROBIC AND ANAEROBIC Blood Culture adequate volume   Culture   Final    NO GROWTH 3 DAYS Performed at [facility], [address], [place], [place] [address]    Report Status PENDING  Incomplete  Culture, blood (routine x 2)     Status: None (Preliminary result)   Collection Time: 05/12/20 10:55 AM   Specimen: BLOOD  Result Value Ref Range Status   Specimen Description BLOOD LEFT ANTECUBITAL  Final   Special Requests   Final    BOTTLES DRAWN AEROBIC AND ANAEROBIC Blood Culture adequate volume   Culture   Final    NO GROWTH 3 DAYS Performed at [facility], [address] [place], [place] [address]    Report Status PENDING  Incomplete     Radiology Studies: MR BRAIN WO CONTRAST  Result Date: 05/14/2020 CLINICAL  DATA:  Neuro deficit, acute, stroke suspected. EXAM: MRI HEAD WITHOUT CONTRAST TECHNIQUE: Multiplanar, multiecho pulse sequences of the brain and surrounding structures were obtained without intravenous contrast. COMPARISON:  Prior head CT examinations 05/10/2020 and earlier. FINDINGS: Brain: Mild cerebral and cerebellar atrophy. As demonstrated previously, there are multiple chronic small-vessel infarcts within the bilateral corona radiata and deep gray nuclei. Associated subtle ex vacuo dilatation of the right lateral ventricle. Microhemorrhage within the lower pons. There is no acute infarct. No evidence of intracranial mass. No extra-axial fluid collection. No midline shift. Vascular: Expected proximal arterial flow voids. Skull and upper cervical spine: No focal marrow lesion. Sinuses/Orbits: Visualized orbits show no acute finding. Minimal bilateral ethmoid sinus mucosal thickening. Other: Trace fluid within the bilateral mastoid air cells. IMPRESSION: No evidence of acute  intracranial abnormality, including acute infarction. As demonstrated previously, there are multiple chronic small-vessel infarcts within the bilateral corona radiata and deep gray nuclei. Mild generalized parenchymal atrophy. Trace bilateral mastoid effusions. Electronically Signed   By: Jackey LogeKyle  Golden DO   On: 05/14/2020 13:03   EEG adult  Result Date: 05/14/2020 Charlsie QuestYadav, Priyanka O, MD     05/14/2020  8:47 AM Patient Name: Julie Donovan MRN: [id number] Epilepsy Attending: Charlsie QuestPriyanka O Yadav Referring Physician/Provider: Dr Baldwin JamaicaHannah Masoud Date: 05/13/2020 Duration: 28.18 mins Patient history: 55 year old female with altered mental status.  EEG to evaluate for seizures. Level of alertness: Awake, asleep AEDs during EEG study: Keppra Technical aspects: This EEG study was done with scalp electrodes positioned according to the 10-20 International system of electrode placement. Electrical activity was acquired at a sampling rate of 500Hz  and reviewed with a high frequency filter of 70Hz  and a low frequency filter of 1Hz . EEG data were recorded continuously and digitally stored. Description: The posterior dominant rhythm consists of 8-9 Hz activity of moderate voltage (25-35 uV) seen predominantly in posterior head regions, symmetric and reactive to eye opening and eye closing. Sleep was characterized by vertex waves, maximal frontocentral region. EEG showed continuous generalized 3 to 6 Hz theta-delta slowing.  Photic driving was not seen during photic stimulation.  Hyperventilation was not performed.   ABNORMALITY - Continuous slow, generalized IMPRESSION: This study is suggestive of mild to moderate diffuse encephalopathy, nonspecific etiology. No seizures or epileptiform discharges were seen throughout the recording. Charlsie QuestPriyanka O Yadav     LOS: 4 days   Albertine GratesFang Lizbeth Feijoo, MD PhD FACP [facility]  05/15/2020, 1:43 PM

## 2020-05-15 NOTE — Progress Notes (Signed)
Subjective: No new complaints  Antibiotics:  Anti-infectives (From admission, onward)   Start     Dose/Rate Route Frequency Ordered Stop   05/15/20 1400  amoxicillin (AMOXIL) 250 MG/5ML suspension 500 mg        500 mg Oral Every 8 hours 05/15/20 1054 06/06/20 2359   05/14/20 1200  ampicillin (OMNIPEN) 2 g in sodium chloride 0.9 % 100 mL IVPB  Status:  Discontinued        2 g 300 mL/hr over 20 Minutes Intravenous Every 4 hours 05/13/20 1403 05/15/20 1054   05/12/20 1500  cefTRIAXone (ROCEPHIN) 2 g in sodium chloride 0.9 % 100 mL IVPB  Status:  Discontinued        2 g 200 mL/hr over 30 Minutes Intravenous Every 24 hours 05/12/20 1042 05/13/20 1403   05/11/20 2100  ceFEPIme (MAXIPIME) 2 g in sodium chloride 0.9 % 100 mL IVPB  Status:  Discontinued        2 g 200 mL/hr over 30 Minutes Intravenous Every 24 hours 05/10/20 2046 05/11/20 1425   05/11/20 1515  ceFEPIme (MAXIPIME) 2 g in sodium chloride 0.9 % 100 mL IVPB  Status:  Discontinued        2 g 200 mL/hr over 30 Minutes Intravenous Every 12 hours 05/11/20 1425 05/12/20 1042   05/11/20 0200  cefOXitin (MEFOXIN) 2 g in sodium chloride 0.9 % 100 mL IVPB        2 g 200 mL/hr over 30 Minutes Intravenous  Once 05/11/20 0150 05/11/20 0232   05/10/20 2100  vancomycin (VANCOREADY) IVPB 2000 mg/400 mL        2,000 mg 200 mL/hr over 120 Minutes Intravenous  Once 05/10/20 2046 05/11/20 0011   05/10/20 2045  vancomycin variable dose per unstable renal function (pharmacist dosing)  Status:  Discontinued         Does not apply See admin instructions 05/10/20 2046 05/12/20 1042   05/10/20 1930  ceFEPIme (MAXIPIME) 2 g in sodium chloride 0.9 % 100 mL IVPB  Status:  Discontinued        2 g 200 mL/hr over 30 Minutes Intravenous  Once 05/10/20 1921 05/12/20 1042   05/10/20 1930  metroNIDAZOLE (FLAGYL) IVPB 500 mg        500 mg 100 mL/hr over 60 Minutes Intravenous  Once 05/10/20 1921 05/10/20 2111   05/10/20 1930  vancomycin (VANCOREADY)  IVPB 1000 mg/200 mL  Status:  Discontinued        1,000 mg 200 mL/hr over 60 Minutes Intravenous  Once 05/10/20 1921 05/10/20 2046      Medications: Scheduled Meds: . amoxicillin  500 mg Oral Q8H  . [START ON 05/16/2020] atorvastatin  40 mg Per Tube Daily  . Chlorhexidine Gluconate Cloth  6 each Topical Daily  . feeding supplement (PROSource TF)  45 mL Per Tube Daily  . insulin aspart  0-5 Units Subcutaneous QHS  . insulin aspart  0-9 Units Subcutaneous TID WC  . insulin aspart  5 Units Subcutaneous TID WC  . insulin glargine  20 Units Subcutaneous QHS  . metoprolol tartrate  12.5 mg Per Tube BID  . sodium chloride flush  10-40 mL Intracatheter Q12H   Continuous Infusions: . feeding supplement (OSMOLITE 1.5 CAL) 1,000 mL (05/13/20 1750)  . levETIRAcetam 500 mg (05/15/20 0521)   PRN Meds:.albuterol, melatonin, morphine injection, sodium chloride flush    Objective: Weight change: 0.6 kg  Intake/Output Summary (Last 24 hours) at 05/15/2020  1236 Last data filed at 05/15/2020 0354 Gross per 24 hour  Intake 55 ml  Output 2950 ml  Net -2895 ml   Blood pressure 133/76, pulse 94, temperature 97.9 F (36.6 C), temperature source Oral, resp. rate (!) 23, height 5\' 4"  (1.626 m), weight 113.4 kg, SpO2 97 %. Temp:  [97.9 F (36.6 C)-98.9 F (37.2 C)] 97.9 F (36.6 C) (04/21 0801) Pulse Rate:  [94-101] 94 (04/21 0801) Resp:  [18-34] 23 (04/21 0801) BP: (132-161)/(76-90) 133/76 (04/21 0801) SpO2:  [94 %-97 %] 97 % (04/21 0801) Weight:  [113.4 kg] 113.4 kg (04/21 0353)  Physical Exam: Physical Exam Constitutional:      General: She is not in acute distress.    Appearance: She is well-developed. She is obese. She is not diaphoretic.  HENT:     Head: Normocephalic and atraumatic.     Right Ear: External ear normal.     Left Ear: External ear normal.     Mouth/Throat:     Pharynx: No oropharyngeal exudate.  Eyes:     General: No scleral icterus.    Extraocular Movements:  Extraocular movements intact.     Conjunctiva/sclera: Conjunctivae normal.  Cardiovascular:     Rate and Rhythm: Normal rate and regular rhythm.     Heart sounds: Normal heart sounds. No murmur heard. No friction rub. No gallop.   Pulmonary:     Effort: Pulmonary effort is normal. No respiratory distress.     Breath sounds: Normal breath sounds. No wheezing or rales.  Abdominal:     General: Bowel sounds are normal. There is no distension.     Palpations: Abdomen is soft.     Tenderness: There is no abdominal tenderness. There is no rebound.  Musculoskeletal:        General: No tenderness. Normal range of motion.  Lymphadenopathy:     Cervical: No cervical adenopathy.  Skin:    General: Skin is warm and dry.     Coloration: Skin is pale.     Findings: No erythema or rash.  Neurological:     Mental Status: She is alert.     Motor: No abnormal muscle tone.     Coordination: Coordination normal.  Psychiatric:        Mood and Affect: Affect is flat.        Speech: Speech is delayed.      CBC:    BMET Recent Labs    05/14/20 0233 05/15/20 0339  NA 138 137  K 3.9 4.2  CL 105 103  CO2 25 27  GLUCOSE 245* 320*  BUN 14 11  CREATININE 0.85 0.80  CALCIUM 7.8* 7.7*     Liver Panel  Recent Labs    05/13/20 0746  PROT 5.3*  ALBUMIN 2.3*  AST 34  ALT 29  ALKPHOS 161*  BILITOT 0.5  BILIDIR 0.2  IBILI 0.3       Sedimentation Rate No results for input(s): ESRSEDRATE in the last 72 hours. C-Reactive Protein No results for input(s): CRP in the last 72 hours.  Micro Results: Recent Results (from the past 720 hour(s))  Culture, blood (Routine x 2)     Status: Abnormal   Collection Time: 05/10/20  7:20 PM   Specimen: BLOOD  Result Value Ref Range Status   Specimen Description BLOOD SITE NOT SPECIFIED  Final   Special Requests   Final    BOTTLES DRAWN AEROBIC AND ANAEROBIC Blood Culture results may not be optimal due to an inadequate  volume of blood received  in culture bottles   Culture  Setup Time (A)  Final    GRAM VARIABLE ROD IN BOTH AEROBIC AND ANAEROBIC BOTTLES CRITICAL RESULT CALLED TO, READ BACK BY AND VERIFIED WITH: PHARMD JESSICA M. [id number] FCP Performed at [facility], [address] [place], [place] [address]    Culture PROTEUS MIRABILIS (A)  Final   Report Status 05/13/2020 FINAL  Final   Organism ID, Bacteria PROTEUS MIRABILIS  Final      Susceptibility   Proteus mirabilis - MIC*    AMPICILLIN <=2 SENSITIVE Sensitive     CEFAZOLIN 8 SENSITIVE Sensitive     CEFEPIME <=0.12 SENSITIVE Sensitive     CEFTAZIDIME <=1 SENSITIVE Sensitive     CEFTRIAXONE <=0.25 SENSITIVE Sensitive     CIPROFLOXACIN >=4 RESISTANT Resistant     GENTAMICIN <=1 SENSITIVE Sensitive     IMIPENEM 2 SENSITIVE Sensitive     TRIMETH/SULFA <=20 SENSITIVE Sensitive     AMPICILLIN/SULBACTAM <=2 SENSITIVE Sensitive     PIP/TAZO <=4 SENSITIVE Sensitive     * PROTEUS MIRABILIS  Blood Culture ID Panel (Reflexed)     Status: Abnormal   Collection Time: 05/10/20  7:20 PM  Result Value Ref Range Status   Enterococcus faecalis NOT DETECTED NOT DETECTED Final   Enterococcus Faecium NOT DETECTED NOT DETECTED Final   Listeria monocytogenes NOT DETECTED NOT DETECTED Final   Staphylococcus species NOT DETECTED NOT DETECTED Final   Staphylococcus aureus (BCID) NOT DETECTED NOT DETECTED Final   Staphylococcus epidermidis NOT DETECTED NOT DETECTED Final   Staphylococcus lugdunensis NOT DETECTED NOT DETECTED Final   Streptococcus species NOT DETECTED NOT DETECTED Final   Streptococcus agalactiae NOT DETECTED NOT DETECTED Final   Streptococcus pneumoniae NOT DETECTED NOT DETECTED Final   Streptococcus pyogenes NOT DETECTED NOT DETECTED Final   A.calcoaceticus-baumannii NOT DETECTED NOT DETECTED Final   Bacteroides fragilis NOT DETECTED NOT DETECTED Final   Enterobacterales DETECTED (A) NOT DETECTED Final    Comment: Enterobacterales represent a large order of  gram negative bacteria, not a single organism. CRITICAL RESULT CALLED TO, READ BACK BY AND VERIFIED WITH: PHARMD JESSICA M. [phone] FCP    Enterobacter cloacae complex NOT DETECTED NOT DETECTED Final   Escherichia coli NOT DETECTED NOT DETECTED Final   Klebsiella aerogenes NOT DETECTED NOT DETECTED Final   Klebsiella oxytoca NOT DETECTED NOT DETECTED Final   Klebsiella pneumoniae NOT DETECTED NOT DETECTED Final   Proteus species DETECTED (A) NOT DETECTED Final    Comment: CRITICAL RESULT CALLED TO, READ BACK BY AND VERIFIED WITH: PHARMD JESSICA M. [phone] FCP    Salmonella species NOT DETECTED NOT DETECTED Final   Serratia marcescens NOT DETECTED NOT DETECTED Final   Haemophilus influenzae NOT DETECTED NOT DETECTED Final   Neisseria meningitidis NOT DETECTED NOT DETECTED Final   Pseudomonas aeruginosa NOT DETECTED NOT DETECTED Final   Stenotrophomonas maltophilia NOT DETECTED NOT DETECTED Final   Candida albicans NOT DETECTED NOT DETECTED Final   Candida auris NOT DETECTED NOT DETECTED Final   Candida glabrata NOT DETECTED NOT DETECTED Final   Candida krusei NOT DETECTED NOT DETECTED Final   Candida parapsilosis NOT DETECTED NOT DETECTED Final   Candida tropicalis NOT DETECTED NOT DETECTED Final   Cryptococcus neoformans/gattii NOT DETECTED NOT DETECTED Final   CTX-M ESBL NOT DETECTED NOT DETECTED Final   Carbapenem resistance IMP NOT DETECTED NOT DETECTED Final   Carbapenem resistance KPC NOT DETECTED NOT DETECTED Final  Carbapenem resistance NDM NOT DETECTED NOT DETECTED Final   Carbapenem resist OXA 48 LIKE NOT DETECTED NOT DETECTED Final   Carbapenem resistance VIM NOT DETECTED NOT DETECTED Final    Comment: Performed at [facility], [address] [place], [place] [address]  Resp Panel by RT-PCR (Flu A&B, Covid) Nasopharyngeal Swab     Status: None   Collection Time: 05/10/20  7:45 PM   Specimen: Nasopharyngeal Swab; Nasopharyngeal(NP) swabs in vial transport  medium  Result Value Ref Range Status   SARS Coronavirus 2 by RT PCR NEGATIVE NEGATIVE Final    Comment: (NOTE) SARS-CoV-2 target nucleic acids are NOT DETECTED.  The SARS-CoV-2 RNA is generally detectable in upper respiratory specimens during the acute phase of infection. The lowest concentration of SARS-CoV-2 viral copies this assay can detect is 138 copies/mL. A negative result does not preclude SARS-Cov-2 infection and should not be used as the sole basis for treatment or other patient management decisions. A negative result may occur with  improper specimen collection/handling, submission of specimen other than nasopharyngeal swab, presence of viral mutation(s) within the areas targeted by this assay, and inadequate number of viral copies(<138 copies/mL). A negative result must be combined with clinical observations, patient history, and epidemiological information. The expected result is Negative.  Fact Sheet for Patients:  BloggerCourse.com  Fact Sheet for Healthcare Providers:  SeriousBroker.it  This test is no t yet approved or cleared by the [place] FDA and  has been authorized for detection and/or diagnosis of SARS-CoV-2 by FDA under an Emergency Use Authorization (EUA). This EUA will remain  in effect (meaning this test can be used) for the duration of the COVID-19 declaration under Section 564(b)(1) of the Act, 21 U.S.C.section 360bbb-3(b)(1), unless the authorization is terminated  or revoked sooner.       Influenza A by PCR NEGATIVE NEGATIVE Final   Influenza B by PCR NEGATIVE NEGATIVE Final    Comment: (NOTE) The Xpert Xpress SARS-CoV-2/FLU/RSV plus assay is intended as an aid in the diagnosis of influenza from Nasopharyngeal swab specimens and should not be used as a sole basis for treatment. Nasal washings and aspirates are unacceptable for Xpert Xpress SARS-CoV-2/FLU/RSV testing.  Fact Sheet for  Patients: BloggerCourse.com  Fact Sheet for Healthcare Providers: SeriousBroker.it  This test is not yet approved or cleared by the [place] FDA and has been authorized for detection and/or diagnosis of SARS-CoV-2 by FDA under an Emergency Use Authorization (EUA). This EUA will remain in effect (meaning this test can be used) for the duration of the COVID-19 declaration under Section 564(b)(1) of the Act, 21 U.S.C. section 360bbb-3(b)(1), unless the authorization is terminated or revoked.  Performed at [facility], [address], [place], [place] [address]   Culture, blood (Routine x 2)     Status: Abnormal   Collection Time: 05/10/20  9:06 PM   Specimen: BLOOD  Result Value Ref Range Status   Specimen Description BLOOD SITE NOT SPECIFIED  Final   Special Requests   Final    BOTTLES DRAWN AEROBIC AND ANAEROBIC Blood Culture results may not be optimal due to an inadequate volume of blood received in culture bottles   Culture  Setup Time (A)  Final    GRAM VARIABLE ROD IN BOTH AEROBIC AND ANAEROBIC BOTTLES CRITICAL VALUE NOTED.  VALUE IS CONSISTENT WITH PREVIOUSLY REPORTED AND CALLED VALUE.    Culture (A)  Final    PROTEUS MIRABILIS SUSCEPTIBILITIES PERFORMED ON PREVIOUS CULTURE WITHIN THE  LAST 5 DAYS. Performed at [facility], [address] [place], [place] [address]    Report Status 05/13/2020 FINAL  Final  Urine Culture     Status: Abnormal   Collection Time: 05/11/20  2:07 AM   Specimen: Urine, Cystoscope  Result Value Ref Range Status   Specimen Description CYSTOSCOPY URINE, CATHETERIZED  Final   Special Requests   Final    PATIENT ON FOLLOWING MAXAPIME Performed at [facility], [address] [place], [place] [address]    Culture >=100,000 COLONIES/mL PROTEUS MIRABILIS (A)  Final   Report Status 05/13/2020 FINAL  Final   Organism ID, Bacteria PROTEUS MIRABILIS (A)  Final       Susceptibility   Proteus mirabilis - MIC*    AMPICILLIN <=2 SENSITIVE Sensitive     CEFAZOLIN 8 SENSITIVE Sensitive     CEFEPIME <=0.12 SENSITIVE Sensitive     CEFTAZIDIME <=1 SENSITIVE Sensitive     CEFTRIAXONE <=0.25 SENSITIVE Sensitive     CIPROFLOXACIN >=4 RESISTANT Resistant     GENTAMICIN <=1 SENSITIVE Sensitive     IMIPENEM 2 SENSITIVE Sensitive     TRIMETH/SULFA <=20 SENSITIVE Sensitive     AMPICILLIN/SULBACTAM <=2 SENSITIVE Sensitive     PIP/TAZO <=4 SENSITIVE Sensitive     * >=100,000 COLONIES/mL PROTEUS MIRABILIS  MRSA PCR Screening     Status: None   Collection Time: 05/11/20  5:37 AM   Specimen: Nasal Mucosa; Nasopharyngeal  Result Value Ref Range Status   MRSA by PCR NEGATIVE NEGATIVE Final    Comment:        The GeneXpert MRSA Assay (FDA approved for NASAL specimens only), is one component of a comprehensive MRSA colonization surveillance program. It is not intended to diagnose MRSA infection nor to guide or monitor treatment for MRSA infections. Performed at [facility], [address] [place], [place] [address]   Culture, blood (routine x 2)     Status: None (Preliminary result)   Collection Time: 05/12/20 10:50 AM   Specimen: BLOOD  Result Value Ref Range Status   Specimen Description BLOOD LEFT ANTECUBITAL  Final   Special Requests   Final    BOTTLES DRAWN AEROBIC AND ANAEROBIC Blood Culture adequate volume   Culture   Final    NO GROWTH 3 DAYS Performed at [facility], [address] [place], [place] [address]    Report Status PENDING  Incomplete  Culture, blood (routine x 2)     Status: None (Preliminary result)   Collection Time: 05/12/20 10:55 AM   Specimen: BLOOD  Result Value Ref Range Status   Specimen Description BLOOD LEFT ANTECUBITAL  Final   Special Requests   Final    BOTTLES DRAWN AEROBIC AND ANAEROBIC Blood Culture adequate volume   Culture   Final    NO GROWTH 3 DAYS Performed at [facility], [address], [place], [place] [address]    Report Status PENDING  Incomplete    Studies/Results: MR BRAIN WO CONTRAST  Result Date: 05/14/2020 CLINICAL DATA:  Neuro deficit, acute, stroke suspected. EXAM: MRI HEAD WITHOUT CONTRAST TECHNIQUE: Multiplanar, multiecho pulse sequences of the brain and surrounding structures were obtained without intravenous contrast. COMPARISON:  Prior head CT examinations 05/10/2020 and earlier. FINDINGS: Brain: Mild cerebral and cerebellar atrophy. As demonstrated previously, there are multiple chronic small-vessel infarcts within the bilateral corona radiata and deep gray nuclei. Associated subtle ex vacuo dilatation of the right lateral ventricle. Microhemorrhage  within the lower pons. There is no acute infarct. No evidence of intracranial mass. No extra-axial fluid collection. No midline shift. Vascular: Expected proximal arterial flow voids. Skull and upper cervical spine: No focal marrow lesion. Sinuses/Orbits: Visualized orbits show no acute finding. Minimal bilateral ethmoid sinus mucosal thickening. Other: Trace fluid within the bilateral mastoid air cells. IMPRESSION: No evidence of acute intracranial abnormality, including acute infarction. As demonstrated previously, there are multiple chronic small-vessel infarcts within the bilateral corona radiata and deep gray nuclei. Mild generalized parenchymal atrophy. Trace bilateral mastoid effusions. Electronically Signed   By: Jackey LogeKyle  Golden DO   On: 05/14/2020 13:03   EEG adult  Result Date: 05/14/2020 Charlsie QuestYadav, Priyanka O, MD     05/14/2020  8:47 AM Patient Name: Julie Donovan MRN: [id number] Epilepsy Attending: Charlsie QuestPriyanka O Yadav Referring Physician/Provider: Dr Baldwin JamaicaHannah Masoud Date: 05/13/2020 Duration: 28.18 mins Patient history: 55 year old female with altered mental status.  EEG to evaluate for seizures. Level of alertness: Awake, asleep AEDs during EEG study: Keppra Technical aspects: This EEG study was done with scalp electrodes  positioned according to the 10-20 International system of electrode placement. Electrical activity was acquired at a sampling rate of 500Hz  and reviewed with a high frequency filter of 70Hz  and a low frequency filter of 1Hz . EEG data were recorded continuously and digitally stored. Description: The posterior dominant rhythm consists of 8-9 Hz activity of moderate voltage (25-35 uV) seen predominantly in posterior head regions, symmetric and reactive to eye opening and eye closing. Sleep was characterized by vertex waves, maximal frontocentral region. EEG showed continuous generalized 3 to 6 Hz theta-delta slowing.  Photic driving was not seen during photic stimulation.  Hyperventilation was not performed.   ABNORMALITY - Continuous slow, generalized IMPRESSION: This study is suggestive of mild to moderate diffuse encephalopathy, nonspecific etiology. No seizures or epileptiform discharges were seen throughout the recording. Priyanka Annabelle Harman Yadav      Assessment/Plan:  INTERVAL HISTORY:    No new issues  Active Problems:   Sepsis (HCC)   Hydronephrosis with renal and ureteral calculus obstruction   Leukocytosis   Lactic acidosis   Hypoalbuminemia   Hyperglycemia   Acute kidney injury superimposed on CKD (HCC)   S/P cystoscopy with ureteral stent placement   Hydronephrosis with obstructing calculus    Julie Donovan is a 55 y.o. female with history of stroke right-sided weakness aphasia who was admitted with encephalopathy and shock resuscitated found to have bilateral hydronephrosis status post cystoscopy with placement of stents by urology with worsening hypotension postoperatively.  She is growing Proteus mirabilis from urine and from her blood cultures  That is pan sensitive  We have switched to IV ampicillin   We will change to amoxicillin down her feeding tube if she cannot take p.o.  We will continue 500 mg of amoxicillin 3 times daily to get through her surgery with urology and 3 days  beyond that.   Julie Donovan has an appointment on May 28, 2020 at 9:30 AM with Dr. Daiva EvesVan Dam  She should arrive 15 to 30 minutes prior to her appointment.    [facility] is located in the [facility] at  [address] in [place].  [address], which is located to the left of the elevators.  Phone: [phone]  Fax: [phone]  [email or web address]  I will sign off for now please call further questions.    LOS: 4 days   Acey LavCornelius Van Dam 05/15/2020, 12:36 PM

## 2020-05-15 NOTE — Progress Notes (Signed)
   05/15/20 0000  Assess: MEWS Score  Temp 98.5 F (36.9 C)  BP (!) 159/86  Pulse Rate 95  ECG Heart Rate 95  Resp (!) 34  Level of Consciousness Alert  SpO2 97 %  O2 Device Room Air  Patient Activity (if Appropriate) In bed  Assess: MEWS Score  MEWS Temp 0  MEWS Systolic 0  MEWS Pulse 0  MEWS RR 2  MEWS LOC 0  MEWS Score 2  MEWS Score Color Yellow  Assess: if the MEWS score is Yellow or Red  Were vital signs taken at a resting state? Yes  Focused Assessment No change from prior assessment  Early Detection of Sepsis Score *See Row Information* Low  MEWS guidelines implemented *See Row Information* Yes  Treat  Pain Scale Faces  Pain Score 0  Take Vital Signs  Increase Vital Sign Frequency  Yellow: Q 2hr X 2 then Q 4hr X 2, if remains yellow, continue Q 4hrs  Escalate  MEWS: Escalate Yellow: discuss with charge nurse/RN and consider discussing with provider and RRT  Notify: Charge Nurse/RN  Name of Charge Nurse/RN Notified Joni Reining RN  Date Charge Nurse/RN Notified 05/15/20  Time Charge Nurse/RN Notified 0000  Document  Patient Outcome Other (Comment) (No interventions done. pt. stable at this time. Charge RN aware. Will continue to monitor.)  Progress note created (see row info) Yes

## 2020-05-16 ENCOUNTER — Encounter (HOSPITAL_COMMUNITY): Payer: Medicaid Other

## 2020-05-16 DIAGNOSIS — R739 Hyperglycemia, unspecified: Secondary | ICD-10-CM | POA: Diagnosis not present

## 2020-05-16 DIAGNOSIS — E8809 Other disorders of plasma-protein metabolism, not elsewhere classified: Secondary | ICD-10-CM | POA: Diagnosis not present

## 2020-05-16 DIAGNOSIS — N132 Hydronephrosis with renal and ureteral calculous obstruction: Secondary | ICD-10-CM | POA: Diagnosis not present

## 2020-05-16 DIAGNOSIS — N179 Acute kidney failure, unspecified: Secondary | ICD-10-CM | POA: Diagnosis not present

## 2020-05-16 LAB — BASIC METABOLIC PANEL
Anion gap: 5 (ref 5–15)
BUN: 11 mg/dL (ref 6–20)
CO2: 28 mmol/L (ref 22–32)
Calcium: 7.9 mg/dL — ABNORMAL LOW (ref 8.9–10.3)
Chloride: 105 mmol/L (ref 98–111)
Creatinine, Ser: 0.75 mg/dL (ref 0.44–1.00)
GFR, Estimated: >60 mL/min (ref >60)
Glucose, Bld: 139 mg/dL — ABNORMAL HIGH (ref 70–99)
Potassium: 4 mmol/L (ref 3.5–5.1)
Sodium: 138 mmol/L (ref 135–145)

## 2020-05-16 LAB — CBC
HCT: 34 % — ABNORMAL LOW (ref 36.0–46.0)
Hemoglobin: 10.8 g/dL — ABNORMAL LOW (ref 12.0–15.0)
MCH: 30.3 pg (ref 26.0–34.0)
MCHC: 31.8 g/dL (ref 30.0–36.0)
MCV: 95.5 fL (ref 80.0–100.0)
Platelets: 110 10*3/uL — ABNORMAL LOW (ref 150–400)
RBC: 3.56 MIL/uL — ABNORMAL LOW (ref 3.87–5.11)
RDW: 13.3 % (ref 11.5–15.5)
WBC: 16.7 10*3/uL — ABNORMAL HIGH (ref 4.0–10.5)
nRBC: 0.1 % (ref 0.0–0.2)

## 2020-05-16 LAB — GLUCOSE, CAPILLARY
Glucose-Capillary: 148 mg/dL — ABNORMAL HIGH (ref 70–99)
Glucose-Capillary: 126 mg/dL — ABNORMAL HIGH (ref 70–99)
Glucose-Capillary: 155 mg/dL — ABNORMAL HIGH (ref 70–99)
Glucose-Capillary: 144 mg/dL — ABNORMAL HIGH (ref 70–99)
Glucose-Capillary: 191 mg/dL — ABNORMAL HIGH (ref 70–99)
Glucose-Capillary: 238 mg/dL — ABNORMAL HIGH (ref 70–99)

## 2020-05-16 LAB — PHOSPHORUS: Phosphorus: 1.5 mg/dL — ABNORMAL LOW (ref 2.5–4.6)

## 2020-05-16 LAB — MAGNESIUM: Magnesium: 1.5 mg/dL — ABNORMAL LOW (ref 1.7–2.4)

## 2020-05-16 MED ORDER — LEVETIRACETAM 100 MG/ML PO SOLN
500.0000 mg | Freq: Two times a day (BID) | ORAL | Status: DC
  Administered 2020-05-16 – 2020-05-21 (×10): 500 mg via ORAL
  Filled 2020-05-16 (×10): qty 5

## 2020-05-16 MED ORDER — K PHOS MONO-SOD PHOS DI & MONO 155-852-130 MG PO TABS
250.0000 mg | ORAL_TABLET | Freq: Three times a day (TID) | ORAL | Status: AC
  Administered 2020-05-16 (×3): 250 mg via ORAL
  Filled 2020-05-16 (×3): qty 1

## 2020-05-16 MED ORDER — LEVETIRACETAM 500 MG PO TABS: 500.0000 mg | ORAL_TABLET | Freq: Two times a day (BID) | ORAL | Status: DC

## 2020-05-16 MED ORDER — HYDROCORTISONE 0.5 % EX OINT
TOPICAL_OINTMENT | Freq: Three times a day (TID) | CUTANEOUS | Status: DC | PRN
  Filled 2020-05-16: qty 28.35

## 2020-05-16 MED ORDER — HYDROCORTISONE 1 % EX OINT
TOPICAL_OINTMENT | Freq: Three times a day (TID) | CUTANEOUS | Status: DC | PRN
  Filled 2020-05-16: qty 28

## 2020-05-16 MED ORDER — MAGNESIUM SULFATE 2 GM/50ML IV SOLN
2.0000 g | Freq: Once | INTRAVENOUS | Status: AC
  Administered 2020-05-16: 2 g via INTRAVENOUS
  Filled 2020-05-16: qty 50

## 2020-05-16 NOTE — Progress Notes (Signed)
   05/16/20 0400  Assess: MEWS Score  Temp 99.9 F (37.7 C)  BP (!) 147/86  Pulse Rate 96  ECG Heart Rate 97  Resp (!) 27  Level of Consciousness Alert  SpO2 100 %  O2 Device Room Air  Patient Activity (if Appropriate) In bed  Assess: MEWS Score  MEWS Temp 0  MEWS Systolic 0  MEWS Pulse 0  MEWS RR 2  MEWS LOC 0  MEWS Score 2  MEWS Score Color Yellow  Assess: if the MEWS score is Yellow or Red  Were vital signs taken at a resting state? Yes  Focused Assessment No change from prior assessment  Early Detection of Sepsis Score *See Row Information* Low  MEWS guidelines implemented *See Row Information* Yes  Treat  MEWS Interventions Other (Comment) (informed charge RN)  Pain Scale 0-10  Pain Score 0  Take Vital Signs  Increase Vital Sign Frequency  Yellow: Q 2hr X 2 then Q 4hr X 2, if remains yellow, continue Q 4hrs  Escalate  MEWS: Escalate Yellow: discuss with charge nurse/RN and consider discussing with provider and RRT  Notify: Charge Nurse/RN  Name of Charge Nurse/RN Notified Joni Reining RN  Date Charge Nurse/RN Notified 05/16/20  Time Charge Nurse/RN Notified 0400  Document  Patient Outcome Other (Comment) (No interventions done. pt. stable at this time. Charge RN aware. Will continue to monitor.)  Progress note created (see row info) Yes

## 2020-05-16 NOTE — Progress Notes (Addendum)
PROGRESS NOTE    Julie Donovan  ZOX:[id number] DOB: 1965/09/22 DOA: 05/10/2020 PCP: Karna Dupes, MD   Chief Complaint  Patient presents with  . Altered Mental Status     Brief Narrative: 55 year old female with a PMH of Dementia who is nonverbal at baseline, Hypertension, Diabetes Mellitus Type 2, Left CVA with right sided deficits, Diastolic Heart Failure, CKD3, and Morbid Obesity (BMI 40 kg/m2) who presented to the ED on 4/17 with altered mental status who quickly became unstable. She was admitted to the ICU and managed for Acute Encephalopathy and Septic Shock due to Proteus Bacteremia secondary to UTI found to have bilateral hydronephrosis s/p bilateral ureteral stent placement  Events: 4/17 Admitted to ICU for septic shock.  Bilateral Ureteral Stents placed. 4/17 Levophed was discontinued. 4/18 Transferred out of ICU  Subjective:  Patient remains lethargic, but does attempt to follow command, not inconsistent likely due to tiredness She says a few words, knows she is in the hospital She is tolerating tube feeds, indwelling Foley in place, left IJ in place She has bilateral lower extremity pitting edema, she is on room air, no hypoxia  Assessment & Plan: Active Problems:   Sepsis (HCC)   Hydronephrosis with renal and ureteral calculus obstruction   Leukocytosis   Lactic acidosis   Hypoalbuminemia   Hyperglycemia   Acute kidney injury superimposed on CKD (HCC)   S/P cystoscopy with ureteral stent placement   Hydronephrosis with obstructing calculus  Acute Toxic Metabolic Encephalopathy - Acute Infection vs Neurological: - Continue antibiotics per Infectious Disease. - Continue home med Keppra 500 mg BID. - EEG showed no epileptiform activity.  - MRI showed no acute infarct or other abnormality. -Appears slightly improving  Proteus Bacteremia secondary to Obstructive Uropathy s/p Bilateral  Ureteral Stent Placement: -  - Blood Cultures grew Proteus sensitive to  Cephalosporins and resistant to Cipro. - initially treated with iv ampicillin,De-escalate antibiotics to PO Amoxicillin on discharge.  - Infectious Disease and Urology are following, appreciate assistance and recommendations.  Leukocytosis, improved: - WBC decreased from 23K to 15K. - 4/18 repeat Blood Cultures are NG.  Mild Lactic Acidosis: Lactate is 2.3 mmol/L.  HR is ~ 100 beats/min.  BNP is 495 pg/mL.  EF is normal.  - received LR 150 CC/hr (106 kg) x 10 hours  On 4/20, she is on tube feeds, I do not see free water flushes - Monitor peripheral edema.  History of CVA with right sided weakness and aphasia: Patient is nonverbal at baseline. - Hold off on antiplatelets until thrombocytopenia improves.  Perform Neurochecks q4h. - Continue Atorvastatin 40 mg nightly.  Moderate Pericardial Effusion: - Echo shows no tamponade.  EF 60 to 65%  Hypokalemia: - replaced and normalized  Hypomagnesium: - Mg 1.5 mg/dL,  Remain low, continue to replace.  Hypophosphatemia: - Ph 1.5 mg/dL, continue to replace  Acute Kidney Injury, resolved: - Creatinine improved from 1.9 to 0.85 mg/dL. - Continue fluids and repeat BMP in am. -had indwelling foley placed unclear indication, will do voiding trial, check bladder scan  Acute Thrombocytopenia: - Platelets decreased from 237 to 77K -110  -HIT ab unremarkable , heparin dvt prophylxis held since 4/20, plt appear has improved,  - venous Doppler bilateral lower extremity ordered on 4/21, not done yet  Insulin-dependent Diabetes Mellitus Type 2: - Glucose was running low on 4/18 and patient was placed on Dextrose infusion.  Glucose is now running high.   -she is on tube feeds, consult dietitian change  from Osmolite to Glucerna tube feeds - currently Lantus 20 units nightly , may need to change to bid if remains hyperglycemic -Schedule NovoLog 5 units every 4 hours, with SSI every 4 hours   Chronic COPD, not in acute exacberbation: - Lung exam is  stable. - Continue nebulizers PRN.  Essential Hypertension: - BP is stable. - Continue Lopressor 12.5 mg BID.  Moderate Protein Calore Malnutrition: - Albumin is 2.3 g/dL.  Morbid Obesity: BMI 40 kg/m2  Oropharyngeal Dysphagia: - SLP recommends NPO status. - Continue Tube Feeds. - If does not improve over the next few days, we will need to begin discussion regarding PEG tube.  Physical Deconditioning: - PT is consulted, most recent recommendation from PT is long term care, please see note from 4/21,i talked to sister over the phone on 4/22 and  made aware of this recommendation.  Lines: Left IJV placed 4/17. Foley: Foley placed 4/17. Nutrition: Tube Feeds are running since 4/17.   Diet Order            DIET - DYS 1 Room service appropriate? No; Fluid consistency: Thin  Diet effective now                 Nutrition Problem: Inadequate oral intake Etiology: inability to eat Signs/Symptoms: NPO status Interventions: Tube feeding,Prostat Patient's Body mass index is 42.91 kg/m.   DVT prophylaxis: Place and maintain sequential compression device Start: 05/12/20 1117 SCDs Start: 05/11/20 0713 Code Status:   Code Status: Full Code  Family Communication: Sister Steward Drone on 4/22  Status is: Inpatient  Remains inpatient appropriate because:Inpatient level of care appropriate due to severity of illness   Dispo: The patient is from: Home              Anticipated d/c is to: SNF              Patient currently is not medically stable to d/c. she is on tube feeds   Difficult to place patient No    Unresulted Labs (From admission, onward)         None      Medications reviewed:  Scheduled Meds: . amoxicillin  500 mg Oral Q8H  . atorvastatin  40 mg Per Tube Daily  . Chlorhexidine Gluconate Cloth  6 each Topical Daily  . insulin aspart  0-20 Units Subcutaneous Q4H  . insulin aspart  5 Units Subcutaneous Q4H  . insulin glargine  20 Units Subcutaneous QHS  .  levETIRAcetam  500 mg Oral BID  . metoprolol tartrate  12.5 mg Per Tube BID  . phosphorus  250 mg Oral Q8H  . sodium chloride flush  10-40 mL Intracatheter Q12H   Continuous Infusions: . feeding supplement (GLUCERNA 1.5 CAL)      Consultants:see note  Procedures:see note  Antimicrobials: Anti-infectives (From admission, onward)   Start     Dose/Rate Route Frequency Ordered Stop   05/15/20 1400  amoxicillin (AMOXIL) 250 MG/5ML suspension 500 mg        500 mg Oral Every 8 hours 05/15/20 1054 06/06/20 2359   05/14/20 1200  ampicillin (OMNIPEN) 2 g in sodium chloride 0.9 % 100 mL IVPB  Status:  Discontinued        2 g 300 mL/hr over 20 Minutes Intravenous Every 4 hours 05/13/20 1403 05/15/20 1054   05/12/20 1500  cefTRIAXone (ROCEPHIN) 2 g in sodium chloride 0.9 % 100 mL IVPB  Status:  Discontinued        2 g 200  mL/hr over 30 Minutes Intravenous Every 24 hours 05/12/20 1042 05/13/20 1403   05/11/20 2100  ceFEPIme (MAXIPIME) 2 g in sodium chloride 0.9 % 100 mL IVPB  Status:  Discontinued        2 g 200 mL/hr over 30 Minutes Intravenous Every 24 hours 05/10/20 2046 05/11/20 1425   05/11/20 1515  ceFEPIme (MAXIPIME) 2 g in sodium chloride 0.9 % 100 mL IVPB  Status:  Discontinued        2 g 200 mL/hr over 30 Minutes Intravenous Every 12 hours 05/11/20 1425 05/12/20 1042   05/11/20 0200  cefOXitin (MEFOXIN) 2 g in sodium chloride 0.9 % 100 mL IVPB        2 g 200 mL/hr over 30 Minutes Intravenous  Once 05/11/20 0150 05/11/20 0232   05/10/20 2100  vancomycin (VANCOREADY) IVPB 2000 mg/400 mL        2,000 mg 200 mL/hr over 120 Minutes Intravenous  Once 05/10/20 2046 05/11/20 0011   05/10/20 2045  vancomycin variable dose per unstable renal function (pharmacist dosing)  Status:  Discontinued         Does not apply See admin instructions 05/10/20 2046 05/12/20 1042   05/10/20 1930  ceFEPIme (MAXIPIME) 2 g in sodium chloride 0.9 % 100 mL IVPB  Status:  Discontinued        2 g 200 mL/hr over  30 Minutes Intravenous  Once 05/10/20 1921 05/12/20 1042   05/10/20 1930  metroNIDAZOLE (FLAGYL) IVPB 500 mg        500 mg 100 mL/hr over 60 Minutes Intravenous  Once 05/10/20 1921 05/10/20 2111   05/10/20 1930  vancomycin (VANCOREADY) IVPB 1000 mg/200 mL  Status:  Discontinued        1,000 mg 200 mL/hr over 60 Minutes Intravenous  Once 05/10/20 1921 05/10/20 2046     Culture/Microbiology    Component Value Date/Time   SDES BLOOD LEFT ANTECUBITAL 05/12/2020 1055   SPECREQUEST  05/12/2020 1055    BOTTLES DRAWN AEROBIC AND ANAEROBIC Blood Culture adequate volume   CULT  05/12/2020 1055    NO GROWTH 4 DAYS Performed at [facility], [address], [place], [place] [address]    REPTSTATUS PENDING 05/12/2020 1055    Other culture-see note  Objective: Vitals: Today's Vitals   05/16/20 0443 05/16/20 0600 05/16/20 0756 05/16/20 1200  BP:  (!) 141/87 140/79 118/76  Pulse:  100 98 94  Resp:  (!) 28 (!) 23 (!) 21  Temp:  99.3 F (37.4 C) 99 F (37.2 C) 98.2 F (36.8 C)  TempSrc:  Axillary Axillary Axillary  SpO2:  93% 97% 100%  Weight: 113.4 kg     Height:      PainSc:   0-No pain     Intake/Output Summary (Last 24 hours) at 05/16/2020 1636 Last data filed at 05/16/2020 1200 Gross per 24 hour  Intake 55 ml  Output 3800 ml  Net -3745 ml   Filed Weights   05/14/20 0500 05/15/20 0353 05/16/20 0443  Weight: 112.8 kg 113.4 kg 113.4 kg   Weight change: 0 kg  Intake/Output from previous day: 04/21 0701 - 04/22 0700 In: 55 [NG/GT:55] Out: 3000 [Urine:3000] Intake/Output this shift: Total I/O In: -  Out: 800 [Urine:800] Filed Weights   05/14/20 0500 05/15/20 0353 05/16/20 0443  Weight: 112.8 kg 113.4 kg 113.4 kg    Examination: General exam: chronically ill appearing, obese, somnolent, opens eyes to stimuli, does  follow commands, whisper a few  words, core track in place, left IJ in place, Foley in place HEENT: NCAT, PERRL Respiratory system: Diminished at bases,  no wheezing, no rales, no rhonchi, not in acute respiratory distress Cardiovascular system: Distant heart sound , RRR,  Gastrointestinal system: Large body habitus, Abdomen soft, NT,ND, BS+. Nervous System: Chronic right sided deficits. Extremities:   Bilateral lower extremity pitting edema, distal peripheral pulses palpable.  Skin: No rashes,no icterus. MSK: Physical deconditioning  Data Reviewed: I have personally reviewed following labs and imaging studies CBC: Recent Labs  Lab 05/10/20 1918 05/11/20 0149 05/12/20 0500 05/13/20 0500 05/14/20 0233 05/15/20 0339 05/16/20 0352  WBC 21.0*   < > 22.5* 23.2* 15.2* 15.6* 16.7*  NEUTROABS 18.6*  --   --   --   --   --   --   HGB 15.0   < > 11.3* 11.2* 11.6* 11.3* 10.8*  HCT 45.1   < > 34.5* 33.6* 35.8* 35.2* 34.0*  MCV 92.2   < > 94.5 92.1 93.5 94.4 95.5  PLT 237   < > 80* 79* 77* 77* 110*   < > = values in this interval not displayed.   Basic Metabolic Panel: Recent Labs  Lab 05/12/20 0500 05/12/20 2050 05/13/20 0500 05/13/20 1739 05/14/20 0233 05/15/20 0339 05/16/20 0352  NA 140  --  141  --  138 135  137 138  K 3.9  --  3.7  --  3.9 4.3  4.2 4.0  CL 112*  --  109  --  105 101  103 105  CO2 22  --  24  --  25 26  27 28   GLUCOSE 87  --  176*  --  245* 314*  320* 139*  BUN 28*  --  24*  --  14 11  11 11   CREATININE 1.48*  --  1.16*  --  0.85 0.83  0.80 0.75  CALCIUM 8.0*  --  8.2*  --  7.8* 7.5*  7.7* 7.9*  MG  --  1.6* 1.6* 1.7 1.5*  --  1.5*  PHOS  --  1.1* <1.0* 2.4* 1.8*  --  1.5*   Liver Function Tests: Recent Labs  Lab 05/10/20 1918 05/13/20 0746 05/15/20 0339  AST 29 34 28  ALT 37 29 34  ALKPHOS 96 161* 201*  BILITOT 0.5 0.5 0.7  PROT 6.4* 5.3* 5.5*  ALBUMIN 3.1* 2.3* 2.3*   Coagulation Profile: Recent Labs  Lab 05/10/20 1918 05/11/20 0713  INR 1.4* 1.8*   CBG: Recent Labs  Lab 05/15/20 2004 05/15/20 2341 05/16/20 0436 05/16/20 0800 05/16/20 1158  GLUCAP 254* 158* 148* 126* 155*    Sepsis Labs: Recent Labs  Lab 05/11/20 1143 05/11/20 2200 05/13/20 0707 05/14/20 1229  LATICACIDVEN 4.7* 2.6* 2.2* 2.3*    Recent Results (from the past 240 hour(s))  Culture, blood (Routine x 2)     Status: Abnormal   Collection Time: 05/10/20  7:20 PM   Specimen: BLOOD  Result Value Ref Range Status   Specimen Description BLOOD SITE NOT SPECIFIED  Final   Special Requests   Final    BOTTLES DRAWN AEROBIC AND ANAEROBIC Blood Culture results may not be optimal due to an inadequate volume of blood received in culture bottles   Culture  Setup Time (A)  Final    GRAM VARIABLE ROD IN BOTH AEROBIC AND ANAEROBIC BOTTLES CRITICAL RESULT CALLED TO, READ BACK BY AND VERIFIED WITH: PHARMD JESSICA M. 1207 05/12/20 FCP Performed at [facility]  Lab, [address], [place], [place] [address]    Culture PROTEUS MIRABILIS (A)  Final   Report Status 05/13/2020 FINAL  Final   Organism ID, Bacteria PROTEUS MIRABILIS  Final      Susceptibility   Proteus mirabilis - MIC*    AMPICILLIN <=2 SENSITIVE Sensitive     CEFAZOLIN 8 SENSITIVE Sensitive     CEFEPIME <=0.12 SENSITIVE Sensitive     CEFTAZIDIME <=1 SENSITIVE Sensitive     CEFTRIAXONE <=0.25 SENSITIVE Sensitive     CIPROFLOXACIN >=4 RESISTANT Resistant     GENTAMICIN <=1 SENSITIVE Sensitive     IMIPENEM 2 SENSITIVE Sensitive     TRIMETH/SULFA <=20 SENSITIVE Sensitive     AMPICILLIN/SULBACTAM <=2 SENSITIVE Sensitive     PIP/TAZO <=4 SENSITIVE Sensitive     * PROTEUS MIRABILIS  Blood Culture ID Panel (Reflexed)     Status: Abnormal   Collection Time: 05/10/20  7:20 PM  Result Value Ref Range Status   Enterococcus faecalis NOT DETECTED NOT DETECTED Final   Enterococcus Faecium NOT DETECTED NOT DETECTED Final   Listeria monocytogenes NOT DETECTED NOT DETECTED Final   Staphylococcus species NOT DETECTED NOT DETECTED Final   Staphylococcus aureus (BCID) NOT DETECTED NOT DETECTED Final   Staphylococcus epidermidis NOT DETECTED NOT  DETECTED Final   Staphylococcus lugdunensis NOT DETECTED NOT DETECTED Final   Streptococcus species NOT DETECTED NOT DETECTED Final   Streptococcus agalactiae NOT DETECTED NOT DETECTED Final   Streptococcus pneumoniae NOT DETECTED NOT DETECTED Final   Streptococcus pyogenes NOT DETECTED NOT DETECTED Final   A.calcoaceticus-baumannii NOT DETECTED NOT DETECTED Final   Bacteroides fragilis NOT DETECTED NOT DETECTED Final   Enterobacterales DETECTED (A) NOT DETECTED Final    Comment: Enterobacterales represent a large order of gram negative bacteria, not a single organism. CRITICAL RESULT CALLED TO, READ BACK BY AND VERIFIED WITH: PHARMD JESSICA M. [phone] FCP    Enterobacter cloacae complex NOT DETECTED NOT DETECTED Final   Escherichia coli NOT DETECTED NOT DETECTED Final   Klebsiella aerogenes NOT DETECTED NOT DETECTED Final   Klebsiella oxytoca NOT DETECTED NOT DETECTED Final   Klebsiella pneumoniae NOT DETECTED NOT DETECTED Final   Proteus species DETECTED (A) NOT DETECTED Final    Comment: CRITICAL RESULT CALLED TO, READ BACK BY AND VERIFIED WITH: PHARMD JESSICA M. [phone] FCP    Salmonella species NOT DETECTED NOT DETECTED Final   Serratia marcescens NOT DETECTED NOT DETECTED Final   Haemophilus influenzae NOT DETECTED NOT DETECTED Final   Neisseria meningitidis NOT DETECTED NOT DETECTED Final   Pseudomonas aeruginosa NOT DETECTED NOT DETECTED Final   Stenotrophomonas maltophilia NOT DETECTED NOT DETECTED Final   Candida albicans NOT DETECTED NOT DETECTED Final   Candida auris NOT DETECTED NOT DETECTED Final   Candida glabrata NOT DETECTED NOT DETECTED Final   Candida krusei NOT DETECTED NOT DETECTED Final   Candida parapsilosis NOT DETECTED NOT DETECTED Final   Candida tropicalis NOT DETECTED NOT DETECTED Final   Cryptococcus neoformans/gattii NOT DETECTED NOT DETECTED Final   CTX-M ESBL NOT DETECTED NOT DETECTED Final   Carbapenem resistance IMP NOT DETECTED NOT  DETECTED Final   Carbapenem resistance KPC NOT DETECTED NOT DETECTED Final   Carbapenem resistance NDM NOT DETECTED NOT DETECTED Final   Carbapenem resist OXA 48 LIKE NOT DETECTED NOT DETECTED Final   Carbapenem resistance VIM NOT DETECTED NOT DETECTED Final    Comment: Performed at [facility], [address] [place], [place] [address]  Resp  Panel by RT-PCR (Flu A&B, Covid) Nasopharyngeal Swab     Status: None   Collection Time: 05/10/20  7:45 PM   Specimen: Nasopharyngeal Swab; Nasopharyngeal(NP) swabs in vial transport medium  Result Value Ref Range Status   SARS Coronavirus 2 by RT PCR NEGATIVE NEGATIVE Final    Comment: (NOTE) SARS-CoV-2 target nucleic acids are NOT DETECTED.  The SARS-CoV-2 RNA is generally detectable in upper respiratory specimens during the acute phase of infection. The lowest concentration of SARS-CoV-2 viral copies this assay can detect is 138 copies/mL. A negative result does not preclude SARS-Cov-2 infection and should not be used as the sole basis for treatment or other patient management decisions. A negative result may occur with  improper specimen collection/handling, submission of specimen other than nasopharyngeal swab, presence of viral mutation(s) within the areas targeted by this assay, and inadequate number of viral copies(<138 copies/mL). A negative result must be combined with clinical observations, patient history, and epidemiological information. The expected result is Negative.  Fact Sheet for Patients:  BloggerCourse.com  Fact Sheet for Healthcare Providers:  SeriousBroker.it  This test is no t yet approved or cleared by the [place] FDA and  has been authorized for detection and/or diagnosis of SARS-CoV-2 by FDA under an Emergency Use Authorization (EUA). This EUA will remain  in effect (meaning this test can be used) for the duration of the COVID-19 declaration under  Section 564(b)(1) of the Act, 21 U.S.C.section 360bbb-3(b)(1), unless the authorization is terminated  or revoked sooner.       Influenza A by PCR NEGATIVE NEGATIVE Final   Influenza B by PCR NEGATIVE NEGATIVE Final    Comment: (NOTE) The Xpert Xpress SARS-CoV-2/FLU/RSV plus assay is intended as an aid in the diagnosis of influenza from Nasopharyngeal swab specimens and should not be used as a sole basis for treatment. Nasal washings and aspirates are unacceptable for Xpert Xpress SARS-CoV-2/FLU/RSV testing.  Fact Sheet for Patients: BloggerCourse.com  Fact Sheet for Healthcare Providers: SeriousBroker.it  This test is not yet approved or cleared by the [place] FDA and has been authorized for detection and/or diagnosis of SARS-CoV-2 by FDA under an Emergency Use Authorization (EUA). This EUA will remain in effect (meaning this test can be used) for the duration of the COVID-19 declaration under Section 564(b)(1) of the Act, 21 U.S.C. section 360bbb-3(b)(1), unless the authorization is terminated or revoked.  Performed at [facility], [address] [place], [place] [address]   Culture, blood (Routine x 2)     Status: Abnormal   Collection Time: 05/10/20  9:06 PM   Specimen: BLOOD  Result Value Ref Range Status   Specimen Description BLOOD SITE NOT SPECIFIED  Final   Special Requests   Final    BOTTLES DRAWN AEROBIC AND ANAEROBIC Blood Culture results may not be optimal due to an inadequate volume of blood received in culture bottles   Culture  Setup Time (A)  Final    GRAM VARIABLE ROD IN BOTH AEROBIC AND ANAEROBIC BOTTLES CRITICAL VALUE NOTED.  VALUE IS CONSISTENT WITH PREVIOUSLY REPORTED AND CALLED VALUE.    Culture (A)  Final    PROTEUS MIRABILIS SUSCEPTIBILITIES PERFORMED ON PREVIOUS CULTURE WITHIN THE LAST 5 DAYS. Performed at [facility], [address] [place], [place] [address]    Report  Status 05/13/2020 FINAL  Final  Urine Culture     Status: Abnormal   Collection Time: 05/11/20  2:07 AM   Specimen: Urine, Cystoscope  Result  Value Ref Range Status   Specimen Description CYSTOSCOPY URINE, CATHETERIZED  Final   Special Requests   Final    PATIENT ON FOLLOWING MAXAPIME Performed at [facility], [address] [place], [place] [address]    Culture >=100,000 COLONIES/mL PROTEUS MIRABILIS (A)  Final   Report Status 05/13/2020 FINAL  Final   Organism ID, Bacteria PROTEUS MIRABILIS (A)  Final      Susceptibility   Proteus mirabilis - MIC*    AMPICILLIN <=2 SENSITIVE Sensitive     CEFAZOLIN 8 SENSITIVE Sensitive     CEFEPIME <=0.12 SENSITIVE Sensitive     CEFTAZIDIME <=1 SENSITIVE Sensitive     CEFTRIAXONE <=0.25 SENSITIVE Sensitive     CIPROFLOXACIN >=4 RESISTANT Resistant     GENTAMICIN <=1 SENSITIVE Sensitive     IMIPENEM 2 SENSITIVE Sensitive     TRIMETH/SULFA <=20 SENSITIVE Sensitive     AMPICILLIN/SULBACTAM <=2 SENSITIVE Sensitive     PIP/TAZO <=4 SENSITIVE Sensitive     * >=100,000 COLONIES/mL PROTEUS MIRABILIS  MRSA PCR Screening     Status: None   Collection Time: 05/11/20  5:37 AM   Specimen: Nasal Mucosa; Nasopharyngeal  Result Value Ref Range Status   MRSA by PCR NEGATIVE NEGATIVE Final    Comment:        The GeneXpert MRSA Assay (FDA approved for NASAL specimens only), is one component of a comprehensive MRSA colonization surveillance program. It is not intended to diagnose MRSA infection nor to guide or monitor treatment for MRSA infections. Performed at [facility], [address] [place], [place] [address]   Culture, blood (routine x 2)     Status: None (Preliminary result)   Collection Time: 05/12/20 10:50 AM   Specimen: BLOOD  Result Value Ref Range Status   Specimen Description BLOOD LEFT ANTECUBITAL  Final   Special Requests   Final    BOTTLES DRAWN AEROBIC AND ANAEROBIC Blood Culture adequate volume   Culture   Final     NO GROWTH 4 DAYS Performed at [facility], [address] [place], [place] [address]    Report Status PENDING  Incomplete  Culture, blood (routine x 2)     Status: None (Preliminary result)   Collection Time: 05/12/20 10:55 AM   Specimen: BLOOD  Result Value Ref Range Status   Specimen Description BLOOD LEFT ANTECUBITAL  Final   Special Requests   Final    BOTTLES DRAWN AEROBIC AND ANAEROBIC Blood Culture adequate volume   Culture   Final    NO GROWTH 4 DAYS Performed at [facility], [address] [place], [place] [address]    Report Status PENDING  Incomplete     Radiology Studies: No results found.   LOS: 5 days   Albertine GratesFang Alazia Crocket, MD PhD FACP [facility]  05/16/2020, 4:36 PM

## 2020-05-16 NOTE — Progress Notes (Signed)
  Speech Language Pathology Treatment: Dysphagia  Patient Details Name: Julie Donovan MRN: [id number] DOB: [birth year] Today's Date: 05/16/2020 Time: 5456-2563 SLP Time Calculation (min) (ACUTE ONLY): 12.77 min  Assessment / Plan / Recommendation Clinical Impression  Pt was seen for dysphagia treatment. Her level of alertness and participation were improved compared to when she was last seen by this SLP. Pt's bolus awareness was improved and cueing was not needed for bolus manipulation. Mastication was prolonged and pt exhibited coughing with regular texture solids. Coughing was also observed with thin liquids via straw which were consumed after solids, but no other s/sx of aspiration were noted. No s/sx of aspiration were noted with puree solids or with thin liquids via cup and oral clearance was adequate. A dysphagia 1 diet with thin liquids will be initiated at this time and SLP will continue to follow pt.    HPI HPI: Pt is a 55 y.o. female with medical history significant for COPD, CHF, CVA with right residual deficit, dementia, hypertension, T2DM, CKD 3A and morbid obesity who presented to the ED due to AMS. Pt found to have leukocytosis and obstructing nephrolithiasis with bilateral hydronephrosis. Pt was taken to OR for stent placement and subsequently has become hypotensive. Dx sepsis and UTI. EEG 4/20: mild to moderate diffuse encephalopathy, nonspecific etiology. No seizures or epileptiform discharges. Pt was made NPO on admission and Cortrak placed 4/18.MRI brain 4/20: no acute intracranial abnormality      SLP Plan  Continue with current plan of care       Recommendations  Diet recommendations: Dysphagia 1 (puree);Thin liquid Liquids provided via: Cup;No straw Medication Administration: Via alternative means (or crushed in puree) Supervision: Full supervision/cueing for compensatory strategies;Staff to assist with self feeding Postural Changes and/or Swallow Maneuvers: Seated  upright 90 degrees                Oral Care Recommendations: Oral care QID;Staff/trained caregiver to provide oral care Follow up Recommendations:  (TBD) SLP Visit Diagnosis: Dysphagia, unspecified (R13.10) Plan: Continue with current plan of care       Fritz Cauthon I. Vear Clock, MS, CCC-SLP Acute Rehabilitation Services Office number [phone] Pager [phone]                Scheryl Marten 05/16/2020, 9:45 AM

## 2020-05-16 NOTE — Progress Notes (Signed)
Nutrition Follow-up  RD working remotely.  DOCUMENTATION CODES:   Obesity unspecified  INTERVENTION:   Continue tube feeds via Cortrak: Glucerna 1.5 at 55 ml/h (1320 ml per day) D/C Prosource TF  Provides 1980 kcal, 109 grams of protein, and 1002 ml of free water daily.   If oral intake remains poor, recommend adding free water flushes, recommend 250 ml QID for total 2002 ml free water daily.  NUTRITION DIAGNOSIS:   Inadequate oral intake related to inability to eat as evidenced by NPO status.  GOAL:   Patient will meet greater than or equal to 90% of their needs  MONITOR:   Diet advancement,Labs,Weight trends,TF tolerance,I & O's  REASON FOR ASSESSMENT:   Consult Enteral/tube feeding initiation and management  Request to change TF to Glucerna  ASSESSMENT:   55 year old female who presented to the ED on 4/26 with AMS. PMH of CHF, CKD stage 4a, COPD, CVA, HTN, T2DM, nonverbal at baseline. Pt admitted with sepsis 2/2 hydronephrosis with renal and ureteral calculus obstruction.   S/P SLP evaluation this morning, diet advancing to dysphagia 1 with thin liquids. Lunch meal tray at bedside untouched. Patient sleeping during RD visit.   Received consult for TF change to Glucerna d/t elevated glucoses. Cortrak in place, tip is gastric. Currently receiving Glucerna 1.5 at 55 ml/h with Prosource TF 45 ml once daily to provide 2020 kcal, 120 gm protein, 1002 ml free water daily.  Medications reviewed and include Novolog, Lantus, K Phos, Mag sulfate, keppra.  Labs reviewed. Phos 1.5, mag 1.5 CBG's: [phone]  NUTRITION - FOCUSED PHYSICAL EXAM: Flowsheet Row Most Recent Value  Orbital Region No depletion  Upper Arm Region No depletion  Thoracic and Lumbar Region No depletion  Buccal Region No depletion  Temple Region No depletion  Clavicle Bone Region No depletion  Clavicle and Acromion Bone Region No depletion  Scapular Bone Region Unable to assess  Dorsal Hand No  depletion  Patellar Region Mild depletion  Anterior Thigh Region Mild depletion  Posterior Calf Region Mild depletion  Edema (RD Assessment) Mild  Hair Reviewed  Eyes Unable to assess  Mouth Unable to assess  Skin Reviewed  Nails Reviewed       Diet Order:   Diet Order            DIET - DYS 1 Room service appropriate? No; Fluid consistency: Thin  Diet effective now                 EDUCATION NEEDS:   No education needs have been identified at this time  Skin:  Skin Assessment: Skin Integrity Issues: Incisions: perineum  Last BM:  4/21 type 7  Height:   Ht Readings from Last 1 Encounters:  05/10/20 5\' 4"  (1.626 m)    Weight:   Wt Readings from Last 1 Encounters:  05/16/20 113.4 kg    BMI:  Body mass index is 42.91 kg/m.  Estimated Nutritional Needs:   Kcal:  1850-2050  Protein:  90-110 grams  Fluid:  1.8-2.0 L   05/18/20, RD, LDN, CNSC Please refer to Amion for contact information.

## 2020-05-17 ENCOUNTER — Inpatient Hospital Stay (HOSPITAL_COMMUNITY): Payer: Medicaid Other

## 2020-05-17 DIAGNOSIS — A419 Sepsis, unspecified organism: Secondary | ICD-10-CM | POA: Diagnosis not present

## 2020-05-17 DIAGNOSIS — J9601 Acute respiratory failure with hypoxia: Secondary | ICD-10-CM | POA: Diagnosis not present

## 2020-05-17 DIAGNOSIS — R609 Edema, unspecified: Secondary | ICD-10-CM

## 2020-05-17 DIAGNOSIS — R6520 Severe sepsis without septic shock: Secondary | ICD-10-CM | POA: Diagnosis not present

## 2020-05-17 LAB — CBC WITH DIFFERENTIAL/PLATELET
Abs Immature Granulocytes: 0.62 10*3/uL — ABNORMAL HIGH (ref 0.00–0.07)
Basophils Absolute: 0.1 10*3/uL (ref 0.0–0.1)
Basophils Relative: 1 %
Eosinophils Absolute: 0.7 10*3/uL — ABNORMAL HIGH (ref 0.0–0.5)
Eosinophils Relative: 5 %
HCT: 33.9 % — ABNORMAL LOW (ref 36.0–46.0)
Hemoglobin: 10.7 g/dL — ABNORMAL LOW (ref 12.0–15.0)
Immature Granulocytes: 4 %
Lymphocytes Relative: 19 %
Lymphs Abs: 2.8 10*3/uL (ref 0.7–4.0)
MCH: 30.3 pg (ref 26.0–34.0)
MCHC: 31.6 g/dL (ref 30.0–36.0)
MCV: 96 fL (ref 80.0–100.0)
Monocytes Absolute: 1.4 10*3/uL — ABNORMAL HIGH (ref 0.1–1.0)
Monocytes Relative: 9 %
Neutro Abs: 9.4 10*3/uL — ABNORMAL HIGH (ref 1.7–7.7)
Neutrophils Relative %: 62 %
Platelets: 167 10*3/uL (ref 150–400)
RBC: 3.53 MIL/uL — ABNORMAL LOW (ref 3.87–5.11)
RDW: 13.7 % (ref 11.5–15.5)
WBC: 15 10*3/uL — ABNORMAL HIGH (ref 4.0–10.5)
nRBC: 0.5 % — ABNORMAL HIGH (ref 0.0–0.2)

## 2020-05-17 LAB — GLUCOSE, CAPILLARY
Glucose-Capillary: 213 mg/dL — ABNORMAL HIGH (ref 70–99)
Glucose-Capillary: 174 mg/dL — ABNORMAL HIGH (ref 70–99)
Glucose-Capillary: 185 mg/dL — ABNORMAL HIGH (ref 70–99)
Glucose-Capillary: 237 mg/dL — ABNORMAL HIGH (ref 70–99)
Glucose-Capillary: 248 mg/dL — ABNORMAL HIGH (ref 70–99)

## 2020-05-17 LAB — BASIC METABOLIC PANEL
Anion gap: 7 (ref 5–15)
BUN: 18 mg/dL (ref 6–20)
CO2: 27 mmol/L (ref 22–32)
Calcium: 8 mg/dL — ABNORMAL LOW (ref 8.9–10.3)
Chloride: 102 mmol/L (ref 98–111)
Creatinine, Ser: 0.83 mg/dL (ref 0.44–1.00)
GFR, Estimated: >60 mL/min (ref >60)
Glucose, Bld: 229 mg/dL — ABNORMAL HIGH (ref 70–99)
Potassium: 4.2 mmol/L (ref 3.5–5.1)
Sodium: 136 mmol/L (ref 135–145)

## 2020-05-17 LAB — CULTURE, BLOOD (ROUTINE X 2)
Culture: NO GROWTH
Culture: NO GROWTH
Special Requests: ADEQUATE
Special Requests: ADEQUATE

## 2020-05-17 LAB — PHOSPHORUS: Phosphorus: 2.7 mg/dL (ref 2.5–4.6)

## 2020-05-17 LAB — MAGNESIUM: Magnesium: 1.7 mg/dL (ref 1.7–2.4)

## 2020-05-17 MED ORDER — INSULIN GLARGINE 100 UNIT/ML ~~LOC~~ SOLN
22.0000 [IU] | Freq: Every day | SUBCUTANEOUS | Status: DC
  Administered 2020-05-17: 22 [IU] via SUBCUTANEOUS
  Filled 2020-05-17 (×2): qty 0.22

## 2020-05-17 MED ORDER — MAGNESIUM SULFATE 2 GM/50ML IV SOLN
2.0000 g | Freq: Once | INTRAVENOUS | Status: AC
  Administered 2020-05-17: 2 g via INTRAVENOUS
  Filled 2020-05-17: qty 50

## 2020-05-17 NOTE — Progress Notes (Addendum)
PROGRESS NOTE    Julie Donovan  FAO:[id number]  DOB: 12/14/1965  DOA: 05/10/2020 PCP: Karna Dupes, MD Outpatient Specialists:   Hospital course:  55 year old female with aphasia, HTN, DM 2 status post L CVA, CKD and HFpEF was admitted 70 with acute metabolic encephalopathy.  Work-up revealed Proteus bacteremia secondary to UTI complicated by bilateral hydronephrosis requiring bilateral ureteral stent placement.  She was treated in the ICU with Levophed and transferred out on 05/12/2020.  When she was stabilized.   Subjective:  Patient is able to speak yes/no questions with me.  She denies any complaints.  She states she knows where she is.  She denies pain.   Objective: Vitals:   05/17/20 0400 05/17/20 0809 05/17/20 1154 05/17/20 1656  BP: (!) 148/79 (!) 149/79 128/75 125/79  Pulse: 88 94 86 94  Resp: (!) 21 20 (!) 24 (!) 22  Temp: 98.9 F (37.2 C) 98.6 F (37 C) 98.8 F (37.1 C) 98.6 F (37 C)  TempSrc: Axillary Axillary Axillary Axillary  SpO2: 98% 97% 100% 96%  Weight:      Height:        Intake/Output Summary (Last 24 hours) at 05/17/2020 1721 Last data filed at 05/17/2020 1300 Gross per 24 hour  Intake 1495 ml  Output 900 ml  Net 595 ml   Filed Weights   05/15/20 0353 05/16/20 0443 05/17/20 0334  Weight: 113.4 kg 113.4 kg 109.8 kg     Exam:  General: Chronically ill-appearing female looking much older than stated age lying in bed quietly without distress. CVS: S1-S2, regular  Respiratory:  decreased air entry bilaterally secondary to decreased inspiratory effort, rales at bases  GI: NABS, soft, NT  LE: No edema.  Neuro: Has some expressive aphasia, difficulty moving right side.   Psych: Flat affect  Assessment & Plan:   Proteus bacteremia secondary to obstructive uropathy requiring bilateral stent placement Appreciate ID consultation and follow-up She has been changed to amoxicillin per feeding tube per their recommendations. They will see  her in follow-up as an outpatient. Creatinine has normalized  Hypomagnesemia We will replete and recheck  DM2 Blood sugars in the mid 200s Increase glargine to 22 units up from 20 units Continue aspart SSI  Metabolic encephalopathy She seems to be improved with treatment of Proteus bacteremia Including EEG and MRI were negative  Seizure disorder Continue Keppra 500 twice daily per home doses  CVA Continue atorvastatin  HTN Continue metoprolol  Oropharyngeal dysphagia Continue tube feeds Will need to discuss peg tube if she does not improve over the next couple of days   DVT prophylaxis: SCD Code Status: Full Family Communication: None Disposition Plan:   Patient is from: Home  Anticipated Discharge Location: SNF  Barriers to Discharge: On tube feeds  Is patient medically stable for Discharge: Will need to address PEG tube which could be addressed as an outpatient but is easier to address as an inpatient   Consultants:  Infectious disease  PCCM  Procedures:  None  Antimicrobials:  Amoxicillin   Data Reviewed:  Basic Metabolic Panel: Recent Labs  Lab 05/13/20 0500 05/13/20 1739 05/14/20 0233 05/15/20 0339 05/16/20 0352 05/17/20 0257  NA 141  --  138 135  137 138 136  K 3.7  --  3.9 4.3  4.2 4.0 4.2  CL 109  --  105 101  103 105 102  CO2 24  --  GLUCOSE 176*  --  245* 314*  320* 139* 229*  BUN 24*  --  CREATININE 1.16*  --  0.85 0.83  0.80 0.75 0.83  CALCIUM 8.2*  --  7.8* 7.5*  7.7* 7.9* 8.0*  MG 1.6* 1.7 1.5*  --  1.5* 1.7  PHOS <1.0* 2.4* 1.8*  --  1.5* 2.7   Liver Function Tests: Recent Labs  Lab 05/10/20 1918 05/13/20 0746 05/15/20 0339  AST 29 34 28  ALT 37 29 34  ALKPHOS 96 161* 201*  BILITOT 0.5 0.5 0.7  PROT 6.4* 5.3* 5.5*  ALBUMIN 3.1* 2.3* 2.3*   No results for input(s): LIPASE, AMYLASE in the last 168 hours. No results for input(s): AMMONIA in the last 168 hours. CBC: Recent  Labs  Lab 05/10/20 1918 05/11/20 0149 05/13/20 0500 05/14/20 0233 05/15/20 0339 05/16/20 0352 05/17/20 0257  WBC 21.0*   < > 23.2* 15.2* 15.6* 16.7* 15.0*  NEUTROABS 18.6*  --   --   --   --   --  9.4*  HGB 15.0   < > 11.2* 11.6* 11.3* 10.8* 10.7*  HCT 45.1   < > 33.6* 35.8* 35.2* 34.0* 33.9*  MCV 92.2   < > 92.1 93.5 94.4 95.5 96.0  PLT 237   < > 79* 77* 77* 110* 167   < > = values in this interval not displayed.   Cardiac Enzymes: Recent Labs  Lab 05/13/20 0746  CKTOTAL 155   BNP (last 3 results) No results for input(s): PROBNP in the last 8760 hours. CBG: Recent Labs  Lab 05/16/20 2313 05/17/20 0400 05/17/20 0812 05/17/20 1156 05/17/20 1658  GLUCAP 238* 213* 174* 185* 237*    Recent Results (from the past 240 hour(s))  Culture, blood (Routine x 2)     Status: Abnormal   Collection Time: 05/10/20  7:20 PM   Specimen: BLOOD  Result Value Ref Range Status   Specimen Description BLOOD SITE NOT SPECIFIED  Final   Special Requests   Final    BOTTLES DRAWN AEROBIC AND ANAEROBIC Blood Culture results may not be optimal due to an inadequate volume of blood received in culture bottles   Culture  Setup Time (A)  Final    GRAM VARIABLE ROD IN BOTH AEROBIC AND ANAEROBIC BOTTLES CRITICAL RESULT CALLED TO, READ BACK BY AND VERIFIED WITH: PHARMD JESSICA M. [id number] FCP Performed at [facility], [address], [place], [place] [address]    Culture PROTEUS MIRABILIS (A)  Final   Report Status 05/13/2020 FINAL  Final   Organism ID, Bacteria PROTEUS MIRABILIS  Final      Susceptibility   Proteus mirabilis - MIC*    AMPICILLIN <=2 SENSITIVE Sensitive     CEFAZOLIN 8 SENSITIVE Sensitive     CEFEPIME <=0.12 SENSITIVE Sensitive     CEFTAZIDIME <=1 SENSITIVE Sensitive     CEFTRIAXONE <=0.25 SENSITIVE Sensitive     CIPROFLOXACIN >=4 RESISTANT Resistant     GENTAMICIN <=1 SENSITIVE Sensitive     IMIPENEM 2 SENSITIVE Sensitive     TRIMETH/SULFA <=20 SENSITIVE  Sensitive     AMPICILLIN/SULBACTAM <=2 SENSITIVE Sensitive     PIP/TAZO <=4 SENSITIVE Sensitive     * PROTEUS MIRABILIS  Blood Culture ID Panel (Reflexed)     Status: Abnormal   Collection Time: 05/10/20  7:20 PM  Result Value Ref Range Status   Enterococcus faecalis NOT DETECTED NOT DETECTED Final   Enterococcus Faecium NOT DETECTED NOT DETECTED Final  Listeria monocytogenes NOT DETECTED NOT DETECTED Final   Staphylococcus species NOT DETECTED NOT DETECTED Final   Staphylococcus aureus (BCID) NOT DETECTED NOT DETECTED Final   Staphylococcus epidermidis NOT DETECTED NOT DETECTED Final   Staphylococcus lugdunensis NOT DETECTED NOT DETECTED Final   Streptococcus species NOT DETECTED NOT DETECTED Final   Streptococcus agalactiae NOT DETECTED NOT DETECTED Final   Streptococcus pneumoniae NOT DETECTED NOT DETECTED Final   Streptococcus pyogenes NOT DETECTED NOT DETECTED Final   A.calcoaceticus-baumannii NOT DETECTED NOT DETECTED Final   Bacteroides fragilis NOT DETECTED NOT DETECTED Final   Enterobacterales DETECTED (A) NOT DETECTED Final    Comment: Enterobacterales represent a large order of gram negative bacteria, not a single organism. CRITICAL RESULT CALLED TO, READ BACK BY AND VERIFIED WITH: PHARMD JESSICA M. [id number] FCP    Enterobacter cloacae complex NOT DETECTED NOT DETECTED Final   Escherichia coli NOT DETECTED NOT DETECTED Final   Klebsiella aerogenes NOT DETECTED NOT DETECTED Final   Klebsiella oxytoca NOT DETECTED NOT DETECTED Final   Klebsiella pneumoniae NOT DETECTED NOT DETECTED Final   Proteus species DETECTED (A) NOT DETECTED Final    Comment: CRITICAL RESULT CALLED TO, READ BACK BY AND VERIFIED WITH: PHARMD JESSICA M. [id number] FCP    Salmonella species NOT DETECTED NOT DETECTED Final   Serratia marcescens NOT DETECTED NOT DETECTED Final   Haemophilus influenzae NOT DETECTED NOT DETECTED Final   Neisseria meningitidis NOT DETECTED NOT DETECTED Final    Pseudomonas aeruginosa NOT DETECTED NOT DETECTED Final   Stenotrophomonas maltophilia NOT DETECTED NOT DETECTED Final   Candida albicans NOT DETECTED NOT DETECTED Final   Candida auris NOT DETECTED NOT DETECTED Final   Candida glabrata NOT DETECTED NOT DETECTED Final   Candida krusei NOT DETECTED NOT DETECTED Final   Candida parapsilosis NOT DETECTED NOT DETECTED Final   Candida tropicalis NOT DETECTED NOT DETECTED Final   Cryptococcus neoformans/gattii NOT DETECTED NOT DETECTED Final   CTX-M ESBL NOT DETECTED NOT DETECTED Final   Carbapenem resistance IMP NOT DETECTED NOT DETECTED Final   Carbapenem resistance KPC NOT DETECTED NOT DETECTED Final   Carbapenem resistance NDM NOT DETECTED NOT DETECTED Final   Carbapenem resist OXA 48 LIKE NOT DETECTED NOT DETECTED Final   Carbapenem resistance VIM NOT DETECTED NOT DETECTED Final    Comment: Performed at [facility], [address] [place], [place] [address]  Resp Panel by RT-PCR (Flu A&B, Covid) Nasopharyngeal Swab     Status: None   Collection Time: 05/10/20  7:45 PM   Specimen: Nasopharyngeal Swab; Nasopharyngeal(NP) swabs in vial transport medium  Result Value Ref Range Status   SARS Coronavirus 2 by RT PCR NEGATIVE NEGATIVE Final    Comment: (NOTE) SARS-CoV-2 target nucleic acids are NOT DETECTED.  The SARS-CoV-2 RNA is generally detectable in upper respiratory specimens during the acute phase of infection. The lowest concentration of SARS-CoV-2 viral copies this assay can detect is 138 copies/mL. A negative result does not preclude SARS-Cov-2 infection and should not be used as the sole basis for treatment or other patient management decisions. A negative result may occur with  improper specimen collection/handling, submission of specimen other than nasopharyngeal swab, presence of viral mutation(s) within the areas targeted by this assay, and inadequate number of viral copies(<138 copies/mL). A negative result must be  combined with clinical observations, patient history, and epidemiological information. The expected result is Negative.  Fact Sheet for Patients:  BloggerCourse.com  Fact Sheet for Healthcare Providers:  SeriousBroker.it  This test is no t yet approved or cleared by the [place] FDA and  has been authorized for detection and/or diagnosis of SARS-CoV-2 by FDA under an Emergency Use Authorization (EUA). This EUA will remain  in effect (meaning this test can be used) for the duration of the COVID-19 declaration under Section 564(b)(1) of the Act, 21 U.S.C.section 360bbb-3(b)(1), unless the authorization is terminated  or revoked sooner.       Influenza A by PCR NEGATIVE NEGATIVE Final   Influenza B by PCR NEGATIVE NEGATIVE Final    Comment: (NOTE) The Xpert Xpress SARS-CoV-2/FLU/RSV plus assay is intended as an aid in the diagnosis of influenza from Nasopharyngeal swab specimens and should not be used as a sole basis for treatment. Nasal washings and aspirates are unacceptable for Xpert Xpress SARS-CoV-2/FLU/RSV testing.  Fact Sheet for Patients: BloggerCourse.comhttps://www.fda.gov/media/152166/download  Fact Sheet for Healthcare Providers: SeriousBroker.ithttps://www.fda.gov/media/152162/download  This test is not yet approved or cleared by the [place] FDA and has been authorized for detection and/or diagnosis of SARS-CoV-2 by FDA under an Emergency Use Authorization (EUA). This EUA will remain in effect (meaning this test can be used) for the duration of the COVID-19 declaration under Section 564(b)(1) of the Act, 21 U.S.C. section 360bbb-3(b)(1), unless the authorization is terminated or revoked.  Performed at [facility], [address] [place], [place] [address]   Culture, blood (Routine x 2)     Status: Abnormal   Collection Time: 05/10/20  9:06 PM   Specimen: BLOOD  Result Value Ref Range Status   Specimen Description BLOOD  SITE NOT SPECIFIED  Final   Special Requests   Final    BOTTLES DRAWN AEROBIC AND ANAEROBIC Blood Culture results may not be optimal due to an inadequate volume of blood received in culture bottles   Culture  Setup Time (A)  Final    GRAM VARIABLE ROD IN BOTH AEROBIC AND ANAEROBIC BOTTLES CRITICAL VALUE NOTED.  VALUE IS CONSISTENT WITH PREVIOUSLY REPORTED AND CALLED VALUE.    Culture (A)  Final    PROTEUS MIRABILIS SUSCEPTIBILITIES PERFORMED ON PREVIOUS CULTURE WITHIN THE LAST 5 DAYS. Performed at [facility], [address] [place], [place] [address]    Report Status 05/13/2020 FINAL  Final  Urine Culture     Status: Abnormal   Collection Time: 05/11/20  2:07 AM   Specimen: Urine, Cystoscope  Result Value Ref Range Status   Specimen Description CYSTOSCOPY URINE, CATHETERIZED  Final   Special Requests   Final    PATIENT ON FOLLOWING MAXAPIME Performed at [facility], [address] [place], [place] [address]    Culture >=100,000 COLONIES/mL PROTEUS MIRABILIS (A)  Final   Report Status 05/13/2020 FINAL  Final   Organism ID, Bacteria PROTEUS MIRABILIS (A)  Final      Susceptibility   Proteus mirabilis - MIC*    AMPICILLIN <=2 SENSITIVE Sensitive     CEFAZOLIN 8 SENSITIVE Sensitive     CEFEPIME <=0.12 SENSITIVE Sensitive     CEFTAZIDIME <=1 SENSITIVE Sensitive     CEFTRIAXONE <=0.25 SENSITIVE Sensitive     CIPROFLOXACIN >=4 RESISTANT Resistant     GENTAMICIN <=1 SENSITIVE Sensitive     IMIPENEM 2 SENSITIVE Sensitive     TRIMETH/SULFA <=20 SENSITIVE Sensitive     AMPICILLIN/SULBACTAM <=2 SENSITIVE Sensitive     PIP/TAZO <=4 SENSITIVE Sensitive     * >=100,000 COLONIES/mL PROTEUS MIRABILIS  MRSA PCR Screening     Status: None  Collection Time: 05/11/20  5:37 AM   Specimen: Nasal Mucosa; Nasopharyngeal  Result Value Ref Range Status   MRSA by PCR NEGATIVE NEGATIVE Final    Comment:        The GeneXpert MRSA Assay (FDA approved for NASAL specimens only), is  one component of a comprehensive MRSA colonization surveillance program. It is not intended to diagnose MRSA infection nor to guide or monitor treatment for MRSA infections. Performed at [facility], [address], [place], [place] [address]   Culture, blood (routine x 2)     Status: None   Collection Time: 05/12/20 10:50 AM   Specimen: BLOOD  Result Value Ref Range Status   Specimen Description BLOOD LEFT ANTECUBITAL  Final   Special Requests   Final    BOTTLES DRAWN AEROBIC AND ANAEROBIC Blood Culture adequate volume   Culture   Final    NO GROWTH 5 DAYS Performed at [facility], [address] [place], [place] [address]    Report Status 05/17/2020 FINAL  Final  Culture, blood (routine x 2)     Status: None   Collection Time: 05/12/20 10:55 AM   Specimen: BLOOD  Result Value Ref Range Status   Specimen Description BLOOD LEFT ANTECUBITAL  Final   Special Requests   Final    BOTTLES DRAWN AEROBIC AND ANAEROBIC Blood Culture adequate volume   Culture   Final    NO GROWTH 5 DAYS Performed at [facility], [address], [place], [place] [address]    Report Status 05/17/2020 FINAL  Final      Studies: VAS [place] LOWER EXTREMITY VENOUS (DVT)  Result Date: 05/17/2020  Lower Venous DVT Study Patient Name:  TIONDRA FANG  Date of Exam:   05/17/2020 Medical Rec #: [id number]      Accession #:    [id number] Date of Birth: [birth year]      Patient Gender: F Patient Age:   60Y Exam Location:  [facility] Procedure:      VAS [place] LOWER EXTREMITY VENOUS (DVT) Referring Phys: [id number] FANG XU --------------------------------------------------------------------------------  Indications: Edema.  Comparison Study: No prior study Performing Technologist: Sherren Kerns RVS  Examination Guidelines: A complete evaluation includes B-mode imaging, spectral Doppler, color Doppler, and power Doppler as needed of all accessible portions of each vessel. Bilateral testing is  considered an integral part of a complete examination. Limited examinations for reoccurring indications may be performed as noted. The reflux portion of the exam is performed with the patient in reverse Trendelenburg.  +---------+---------------+---------+-----------+----------+--------------+ RIGHT    CompressibilityPhasicitySpontaneityPropertiesThrombus Aging +---------+---------------+---------+-----------+----------+--------------+ CFV      Full           Yes      Yes                                 +---------+---------------+---------+-----------+----------+--------------+ SFJ      Full                                                        +---------+---------------+---------+-----------+----------+--------------+ FV Prox  Full                                                        +---------+---------------+---------+-----------+----------+--------------+  FV Mid   Full                                                        +---------+---------------+---------+-----------+----------+--------------+ FV DistalFull                                                        +---------+---------------+---------+-----------+----------+--------------+ PFV      Full                                                        +---------+---------------+---------+-----------+----------+--------------+ POP      Full           Yes      Yes                                 +---------+---------------+---------+-----------+----------+--------------+ PTV      Full                                                        +---------+---------------+---------+-----------+----------+--------------+ PERO     Full                                                        +---------+---------------+---------+-----------+----------+--------------+   +---------+---------------+---------+-----------+----------+--------------+ LEFT      CompressibilityPhasicitySpontaneityPropertiesThrombus Aging +---------+---------------+---------+-----------+----------+--------------+ CFV      Full           Yes      Yes                                 +---------+---------------+---------+-----------+----------+--------------+ SFJ      Full                                                        +---------+---------------+---------+-----------+----------+--------------+ FV Prox  Full                                                        +---------+---------------+---------+-----------+----------+--------------+ FV Mid   Full                                                        +---------+---------------+---------+-----------+----------+--------------+  FV DistalFull                                                        +---------+---------------+---------+-----------+----------+--------------+ PFV      Full                                                        +---------+---------------+---------+-----------+----------+--------------+ POP      Full           Yes      Yes                                 +---------+---------------+---------+-----------+----------+--------------+ PTV      Full                                                        +---------+---------------+---------+-----------+----------+--------------+ PERO     Full                                                        +---------+---------------+---------+-----------+----------+--------------+     Summary: BILATERAL: - No evidence of deep vein thrombosis seen in the lower extremities, bilaterally. -   *See table(s) above for measurements and observations.    Preliminary      Scheduled Meds: . amoxicillin  500 mg Oral Q8H  . atorvastatin  40 mg Per Tube Daily  . Chlorhexidine Gluconate Cloth  6 each Topical Daily  . insulin aspart  0-20 Units Subcutaneous Q4H  . insulin aspart  5 Units Subcutaneous Q4H  . insulin  glargine  20 Units Subcutaneous QHS  . levETIRAcetam  500 mg Oral BID  . metoprolol tartrate  12.5 mg Per Tube BID  . sodium chloride flush  10-40 mL Intracatheter Q12H   Continuous Infusions: . feeding supplement (GLUCERNA 1.5 CAL)      Active Problems:   Sepsis (HCC)   Hydronephrosis with renal and ureteral calculus obstruction   Leukocytosis   Lactic acidosis   Hypoalbuminemia   Hyperglycemia   Acute kidney injury superimposed on CKD (HCC)   S/P cystoscopy with ureteral stent placement   Hydronephrosis with obstructing calculus     Horatio Pel Orma Flaming, [facility]  If 7PM-7AM, please contact night-coverage [email or web address]   LOS: 6 days

## 2020-05-17 NOTE — Progress Notes (Signed)
VASCULAR LAB    Bilateral lower extremity venous duplex has been performed.  See CV proc for preliminary results.   Coty Larsh, RVT 05/17/2020, 2:11 PM

## 2020-05-18 DIAGNOSIS — A419 Sepsis, unspecified organism: Secondary | ICD-10-CM | POA: Diagnosis not present

## 2020-05-18 DIAGNOSIS — R6520 Severe sepsis without septic shock: Secondary | ICD-10-CM | POA: Diagnosis not present

## 2020-05-18 DIAGNOSIS — J9601 Acute respiratory failure with hypoxia: Secondary | ICD-10-CM | POA: Diagnosis not present

## 2020-05-18 LAB — GLUCOSE, CAPILLARY
Glucose-Capillary: 213 mg/dL — ABNORMAL HIGH (ref 70–99)
Glucose-Capillary: 221 mg/dL — ABNORMAL HIGH (ref 70–99)
Glucose-Capillary: 250 mg/dL — ABNORMAL HIGH (ref 70–99)
Glucose-Capillary: 263 mg/dL — ABNORMAL HIGH (ref 70–99)
Glucose-Capillary: 270 mg/dL — ABNORMAL HIGH (ref 70–99)
Glucose-Capillary: 282 mg/dL — ABNORMAL HIGH (ref 70–99)
Glucose-Capillary: 287 mg/dL — ABNORMAL HIGH (ref 70–99)
Glucose-Capillary: 289 mg/dL — ABNORMAL HIGH (ref 70–99)
Glucose-Capillary: 297 mg/dL — ABNORMAL HIGH (ref 70–99)

## 2020-05-18 LAB — BASIC METABOLIC PANEL
Anion gap: 6 (ref 5–15)
BUN: 20 mg/dL (ref 6–20)
CO2: 27 mmol/L (ref 22–32)
Calcium: 8.2 mg/dL — ABNORMAL LOW (ref 8.9–10.3)
Chloride: 104 mmol/L (ref 98–111)
Creatinine, Ser: 0.88 mg/dL (ref 0.44–1.00)
GFR, Estimated: >60 mL/min (ref >60)
Glucose, Bld: 270 mg/dL — ABNORMAL HIGH (ref 70–99)
Potassium: 4.5 mmol/L (ref 3.5–5.1)
Sodium: 137 mmol/L (ref 135–145)

## 2020-05-18 LAB — CBC
HCT: 33.3 % — ABNORMAL LOW (ref 36.0–46.0)
Hemoglobin: 10.4 g/dL — ABNORMAL LOW (ref 12.0–15.0)
MCH: 30.1 pg (ref 26.0–34.0)
MCHC: 31.2 g/dL (ref 30.0–36.0)
MCV: 96.2 fL (ref 80.0–100.0)
Platelets: 215 10*3/uL (ref 150–400)
RBC: 3.46 MIL/uL — ABNORMAL LOW (ref 3.87–5.11)
RDW: 13.6 % (ref 11.5–15.5)
WBC: 15.1 10*3/uL — ABNORMAL HIGH (ref 4.0–10.5)
nRBC: 0.8 % — ABNORMAL HIGH (ref 0.0–0.2)

## 2020-05-18 LAB — MAGNESIUM: Magnesium: 1.9 mg/dL (ref 1.7–2.4)

## 2020-05-18 MED ORDER — INSULIN GLARGINE 100 UNIT/ML ~~LOC~~ SOLN
26.0000 [IU] | Freq: Every day | SUBCUTANEOUS | Status: DC
  Administered 2020-05-18 – 2020-05-21 (×4): 26 [IU] via SUBCUTANEOUS
  Filled 2020-05-18 (×6): qty 0.26

## 2020-05-18 NOTE — Progress Notes (Signed)
PROGRESS NOTE    Julie AcheBarbara Donovan  FAO:[id number]  DOB: [birth year]  DOA: 05/10/2020 PCP: Karna DupesBlake, Khashana A, MD Outpatient Specialists:   Hospital course:  55 year old female with aphasia, HTN, DM 2 status post L CVA, CKD and HFpEF was admitted [address]17 with acute metabolic encephalopathy.  Work-up revealed Proteus bacteremia secondary to UTI complicated by bilateral hydronephrosis requiring bilateral ureteral stent placement.  She was treated in the ICU with Levophed and transferred out on 05/12/2020.     Subjective:  Is very teary today.  She states "I want to go home.  When can I go home".  When I ask her who her closest family is, patient starts crying and states "my sister, I want to talk to my sister, can you tell my sister to call".  Objective: Vitals:   05/18/20 0000 05/18/20 0300 05/18/20 0716 05/18/20 1210  BP: (!) 143/72  131/68 130/70  Pulse: 88 100 98 92  Resp: (!) 27 (!) 26 (!) 24 (!) 25  Temp: 98.6 F (37 C)  97.6 F (36.4 C) 97.6 F (36.4 C)  TempSrc: Oral  Oral Oral  SpO2: 94% 93% 94% 93%  Weight:  111.9 kg    Height:        Intake/Output Summary (Last 24 hours) at 05/18/2020 1533 Last data filed at 05/18/2020 1302 Gross per 24 hour  Intake 1116.31 ml  Output 600 ml  Net 516.31 ml   Filed Weights   05/16/20 0443 05/17/20 0334 05/18/20 0300  Weight: 113.4 kg 109.8 kg 111.9 kg     Exam:  General: Obese, chronically ill-appearing female looking much older than stated age lying in bed crying. CVS: S1-S2, regular  Respiratory:  decreased air entry bilaterally secondary to decreased inspiratory effort, rales at bases  GI: NABS, soft, NT  LE: No edema.  Neuro: Aphasia, difficulty moving right side. Psych: Tearful and depressed.  Assessment & Plan:   Proteus bacteremia secondary to obstructive uropathy requiring bilateral stent placement Appreciate ID consultation and follow-up She has been changed to amoxicillin per feeding tube per their  recommendations--we will need to check with ID as to how long she needs to continue this. Creatinine has normalized  Hypomagnesemia Normalized at 1.9 today  DM2 Blood sugars in the mid 200s Increase glargine to 26 units up from 22 units yesterday Continue aspart SSI Diabetes coordinator consulted given tube feeds and ongoing hyperglycemia.  Metabolic encephalopathy Per discussion with patient's sister, she seems to be back at baseline.  Patient now able to speak although she clearly has cognitive, speech and motor deficits.   EEG and MRI were negative for new acute findings.  Seizure disorder Continue Keppra 500 twice daily per home doses  CVA Continue atorvastatin  HTN Continue metoprolol  Oropharyngeal dysphagia Continue tube feeds Will need to discuss peg tube if she does not improve over the next couple of days   DVT prophylaxis: SCD Code Status: Full Family Communication: Spoke with patient's sister Steward DroneBrenda who is her next of kin.  She lives in [place] and is unable to visit however will try to call per patient's request.  I explained that she will need to decide about PEG issue probably sometime next week. Disposition Plan:   Patient is from: Home  Anticipated Discharge Location: SNF  Barriers to Discharge: On tube feeds  Is patient medically stable for Discharge: Will need to address PEG tube which could be addressed as an outpatient but is easier to address as an inpatient--but she is stable  for discharge.    Consultants:  Infectious disease  PCCM  Procedures:  None  Antimicrobials:  Amoxicillin   Data Reviewed:  Basic Metabolic Panel: Recent Labs  Lab 05/13/20 0500 05/13/20 1739 05/14/20 0233 05/15/20 0339 05/16/20 0352 05/17/20 0257 05/18/20 0341  NA 141  --  138 135  137 138 136 137  K 3.7  --  3.9 4.3  4.2 4.0 4.2 4.5  CL 109  --  105 101  103 105 102 104  CO2 24  --  25 26  27 28 27 27   GLUCOSE 176*  --  245* 314*  320* 139* 229*  270*  BUN 24*  --  14 11  11 11 18 20   CREATININE 1.16*  --  0.85 0.83  0.80 0.75 0.83 0.88  CALCIUM 8.2*  --  7.8* 7.5*  7.7* 7.9* 8.0* 8.2*  MG 1.6* 1.7 1.5*  --  1.5* 1.7 1.9  PHOS <1.0* 2.4* 1.8*  --  1.5* 2.7  --    Liver Function Tests: Recent Labs  Lab 05/13/20 0746 05/15/20 0339  AST 34 28  ALT 29 34  ALKPHOS 161* 201*  BILITOT 0.5 0.7  PROT 5.3* 5.5*  ALBUMIN 2.3* 2.3*   No results for input(s): LIPASE, AMYLASE in the last 168 hours. No results for input(s): AMMONIA in the last 168 hours. CBC: Recent Labs  Lab 05/14/20 0233 05/15/20 0339 05/16/20 0352 05/17/20 0257 05/18/20 0341  WBC 15.2* 15.6* 16.7* 15.0* 15.1*  NEUTROABS  --   --   --  9.4*  --   HGB 11.6* 11.3* 10.8* 10.7* 10.4*  HCT 35.8* 35.2* 34.0* 33.9* 33.3*  MCV 93.5 94.4 95.5 96.0 96.2  PLT 77* 77* 110* 167 215   Cardiac Enzymes: Recent Labs  Lab 05/13/20 0746  CKTOTAL 155   BNP (last 3 results) No results for input(s): PROBNP in the last 8760 hours. CBG: Recent Labs  Lab 05/17/20 1959 05/18/20 0020 05/18/20 0345 05/18/20 0718 05/18/20 1213  GLUCAP 248* 287* 270* 221* 213*    Recent Results (from the past 240 hour(s))  Culture, blood (Routine x 2)     Status: Abnormal   Collection Time: 05/10/20  7:20 PM   Specimen: BLOOD  Result Value Ref Range Status   Specimen Description BLOOD SITE NOT SPECIFIED  Final   Special Requests   Final    BOTTLES DRAWN AEROBIC AND ANAEROBIC Blood Culture results may not be optimal due to an inadequate volume of blood received in culture bottles   Culture  Setup Time (A)  Final    GRAM VARIABLE ROD IN BOTH AEROBIC AND ANAEROBIC BOTTLES CRITICAL RESULT CALLED TO, READ BACK BY AND VERIFIED WITH: PHARMD JESSICA M. 1207 05/12/20 FCP Performed at [facility], [address] [place], [address] [place]    Culture PROTEUS MIRABILIS (A)  Final   Report Status 05/13/2020 FINAL  Final   Organism ID, Bacteria PROTEUS MIRABILIS  Final       Susceptibility   Proteus mirabilis - MIC*    AMPICILLIN <=2 SENSITIVE Sensitive     CEFAZOLIN 8 SENSITIVE Sensitive     CEFEPIME <=0.12 SENSITIVE Sensitive     CEFTAZIDIME <=1 SENSITIVE Sensitive     CEFTRIAXONE <=0.25 SENSITIVE Sensitive     CIPROFLOXACIN >=4 RESISTANT Resistant     GENTAMICIN <=1 SENSITIVE Sensitive     IMIPENEM 2 SENSITIVE Sensitive     TRIMETH/SULFA <=20 SENSITIVE Sensitive     AMPICILLIN/SULBACTAM <=2 SENSITIVE  Sensitive     PIP/TAZO <=4 SENSITIVE Sensitive     * PROTEUS MIRABILIS  Blood Culture ID Panel (Reflexed)     Status: Abnormal   Collection Time: 05/10/20  7:20 PM  Result Value Ref Range Status   Enterococcus faecalis NOT DETECTED NOT DETECTED Final   Enterococcus Faecium NOT DETECTED NOT DETECTED Final   Listeria monocytogenes NOT DETECTED NOT DETECTED Final   Staphylococcus species NOT DETECTED NOT DETECTED Final   Staphylococcus aureus (BCID) NOT DETECTED NOT DETECTED Final   Staphylococcus epidermidis NOT DETECTED NOT DETECTED Final   Staphylococcus lugdunensis NOT DETECTED NOT DETECTED Final   Streptococcus species NOT DETECTED NOT DETECTED Final   Streptococcus agalactiae NOT DETECTED NOT DETECTED Final   Streptococcus pneumoniae NOT DETECTED NOT DETECTED Final   Streptococcus pyogenes NOT DETECTED NOT DETECTED Final   A.calcoaceticus-baumannii NOT DETECTED NOT DETECTED Final   Bacteroides fragilis NOT DETECTED NOT DETECTED Final   Enterobacterales DETECTED (A) NOT DETECTED Final    Comment: Enterobacterales represent a large order of gram negative bacteria, not a single organism. CRITICAL RESULT CALLED TO, READ BACK BY AND VERIFIED WITH: PHARMD JESSICA M. 1207 [id number] FCP    Enterobacter cloacae complex NOT DETECTED NOT DETECTED Final   Escherichia coli NOT DETECTED NOT DETECTED Final   Klebsiella aerogenes NOT DETECTED NOT DETECTED Final   Klebsiella oxytoca NOT DETECTED NOT DETECTED Final   Klebsiella pneumoniae NOT DETECTED NOT DETECTED  Final   Proteus species DETECTED (A) NOT DETECTED Final    Comment: CRITICAL RESULT CALLED TO, READ BACK BY AND VERIFIED WITH: PHARMD JESSICA M. 1207 [id number] FCP    Salmonella species NOT DETECTED NOT DETECTED Final   Serratia marcescens NOT DETECTED NOT DETECTED Final   Haemophilus influenzae NOT DETECTED NOT DETECTED Final   Neisseria meningitidis NOT DETECTED NOT DETECTED Final   Pseudomonas aeruginosa NOT DETECTED NOT DETECTED Final   Stenotrophomonas maltophilia NOT DETECTED NOT DETECTED Final   Candida albicans NOT DETECTED NOT DETECTED Final   Candida auris NOT DETECTED NOT DETECTED Final   Candida glabrata NOT DETECTED NOT DETECTED Final   Candida krusei NOT DETECTED NOT DETECTED Final   Candida parapsilosis NOT DETECTED NOT DETECTED Final   Candida tropicalis NOT DETECTED NOT DETECTED Final   Cryptococcus neoformans/gattii NOT DETECTED NOT DETECTED Final   CTX-M ESBL NOT DETECTED NOT DETECTED Final   Carbapenem resistance IMP NOT DETECTED NOT DETECTED Final   Carbapenem resistance KPC NOT DETECTED NOT DETECTED Final   Carbapenem resistance NDM NOT DETECTED NOT DETECTED Final   Carbapenem resist OXA 48 LIKE NOT DETECTED NOT DETECTED Final   Carbapenem resistance VIM NOT DETECTED NOT DETECTED Final    Comment: Performed at [facility], [address] [place], [place] [address]  Resp Panel by RT-PCR (Flu A&B, Covid) Nasopharyngeal Swab     Status: None   Collection Time: 05/10/20  7:45 PM   Specimen: Nasopharyngeal Swab; Nasopharyngeal(NP) swabs in vial transport medium  Result Value Ref Range Status   SARS Coronavirus 2 by RT PCR NEGATIVE NEGATIVE Final    Comment: (NOTE) SARS-CoV-2 target nucleic acids are NOT DETECTED.  The SARS-CoV-2 RNA is generally detectable in upper respiratory specimens during the acute phase of infection. The lowest concentration of SARS-CoV-2 viral copies this assay can detect is 138 copies/mL. A negative result does not preclude  SARS-Cov-2 infection and should not be used as the sole basis for treatment or other patient management decisions. A negative result may occur with  improper specimen collection/handling, submission of specimen other than nasopharyngeal swab, presence of viral mutation(s) within the areas targeted by this assay, and inadequate number of viral copies(<138 copies/mL). A negative result must be combined with clinical observations, patient history, and epidemiological information. The expected result is Negative.  Fact Sheet for Patients:  BloggerCourse.com  Fact Sheet for Healthcare Providers:  SeriousBroker.it  This test is no t yet approved or cleared by the [place] FDA and  has been authorized for detection and/or diagnosis of SARS-CoV-2 by FDA under an Emergency Use Authorization (EUA). This EUA will remain  in effect (meaning this test can be used) for the duration of the COVID-19 declaration under Section 564(b)(1) of the Act, 21 U.S.C.section 360bbb-3(b)(1), unless the authorization is terminated  or revoked sooner.       Influenza A by PCR NEGATIVE NEGATIVE Final   Influenza B by PCR NEGATIVE NEGATIVE Final    Comment: (NOTE) The Xpert Xpress SARS-CoV-2/FLU/RSV plus assay is intended as an aid in the diagnosis of influenza from Nasopharyngeal swab specimens and should not be used as a sole basis for treatment. Nasal washings and aspirates are unacceptable for Xpert Xpress SARS-CoV-2/FLU/RSV testing.  Fact Sheet for Patients: BloggerCourse.com  Fact Sheet for Healthcare Providers: SeriousBroker.it  This test is not yet approved or cleared by the [place] FDA and has been authorized for detection and/or diagnosis of SARS-CoV-2 by FDA under an Emergency Use Authorization (EUA). This EUA will remain in effect (meaning this test can be used) for the duration of  the COVID-19 declaration under Section 564(b)(1) of the Act, 21 U.S.C. section 360bbb-3(b)(1), unless the authorization is terminated or revoked.  Performed at [facility], [address], [place], [place] [address]   Culture, blood (Routine x 2)     Status: Abnormal   Collection Time: 05/10/20  9:06 PM   Specimen: BLOOD  Result Value Ref Range Status   Specimen Description BLOOD SITE NOT SPECIFIED  Final   Special Requests   Final    BOTTLES DRAWN AEROBIC AND ANAEROBIC Blood Culture results may not be optimal due to an inadequate volume of blood received in culture bottles   Culture  Setup Time (A)  Final    GRAM VARIABLE ROD IN BOTH AEROBIC AND ANAEROBIC BOTTLES CRITICAL VALUE NOTED.  VALUE IS CONSISTENT WITH PREVIOUSLY REPORTED AND CALLED VALUE.    Culture (A)  Final    PROTEUS MIRABILIS SUSCEPTIBILITIES PERFORMED ON PREVIOUS CULTURE WITHIN THE LAST 5 DAYS. Performed at [facility], [address], [place], [place] [address]    Report Status 05/13/2020 FINAL  Final  Urine Culture     Status: Abnormal   Collection Time: 05/11/20  2:07 AM   Specimen: Urine, Cystoscope  Result Value Ref Range Status   Specimen Description CYSTOSCOPY URINE, CATHETERIZED  Final   Special Requests   Final    PATIENT ON FOLLOWING MAXAPIME Performed at [facility], [address] [place], [place] [address]    Culture >=100,000 COLONIES/mL PROTEUS MIRABILIS (A)  Final   Report Status 05/13/2020 FINAL  Final   Organism ID, Bacteria PROTEUS MIRABILIS (A)  Final      Susceptibility   Proteus mirabilis - MIC*    AMPICILLIN <=2 SENSITIVE Sensitive     CEFAZOLIN 8 SENSITIVE Sensitive     CEFEPIME <=0.12 SENSITIVE Sensitive     CEFTAZIDIME <=1 SENSITIVE Sensitive     CEFTRIAXONE <=0.25 SENSITIVE Sensitive     CIPROFLOXACIN >=  4 RESISTANT Resistant     GENTAMICIN <=1 SENSITIVE Sensitive     IMIPENEM 2 SENSITIVE Sensitive     TRIMETH/SULFA <=20 SENSITIVE Sensitive      AMPICILLIN/SULBACTAM <=2 SENSITIVE Sensitive     PIP/TAZO <=4 SENSITIVE Sensitive     * >=100,000 COLONIES/mL PROTEUS MIRABILIS  MRSA PCR Screening     Status: None   Collection Time: 05/11/20  5:37 AM   Specimen: Nasal Mucosa; Nasopharyngeal  Result Value Ref Range Status   MRSA by PCR NEGATIVE NEGATIVE Final    Comment:        The GeneXpert MRSA Assay (FDA approved for NASAL specimens only), is one component of a comprehensive MRSA colonization surveillance program. It is not intended to diagnose MRSA infection nor to guide or monitor treatment for MRSA infections. Performed at [facility], [address], [place], [place] [address]   Culture, blood (routine x 2)     Status: None   Collection Time: 05/12/20 10:50 AM   Specimen: BLOOD  Result Value Ref Range Status   Specimen Description BLOOD LEFT ANTECUBITAL  Final   Special Requests   Final    BOTTLES DRAWN AEROBIC AND ANAEROBIC Blood Culture adequate volume   Culture   Final    NO GROWTH 5 DAYS Performed at [facility], [address] [place], [place] [address]    Report Status 05/17/2020 FINAL  Final  Culture, blood (routine x 2)     Status: None   Collection Time: 05/12/20 10:55 AM   Specimen: BLOOD  Result Value Ref Range Status   Specimen Description BLOOD LEFT ANTECUBITAL  Final   Special Requests   Final    BOTTLES DRAWN AEROBIC AND ANAEROBIC Blood Culture adequate volume   Culture   Final    NO GROWTH 5 DAYS Performed at [facility], [address], [place], [place] [address]    Report Status 05/17/2020 FINAL  Final      Studies: VAS [place] LOWER EXTREMITY VENOUS (DVT)  Result Date: 05/18/2020  Lower Venous DVT Study Patient Name:  ERVIN ROTHBAUER  Date of Exam:   05/17/2020 Medical Rec #: [id number]      Accession #:    [id number] Date of Birth: 1965/03/13      Patient Gender: F Patient Age:   81Y Exam Location:  [facility] Procedure:      VAS [place] LOWER EXTREMITY VENOUS (DVT)  Referring Phys: [id number] FANG XU --------------------------------------------------------------------------------  Indications: Edema.  Comparison Study: No prior study Performing Technologist: Sherren Kerns RVS  Examination Guidelines: A complete evaluation includes B-mode imaging, spectral Doppler, color Doppler, and power Doppler as needed of all accessible portions of each vessel. Bilateral testing is considered an integral part of a complete examination. Limited examinations for reoccurring indications may be performed as noted. The reflux portion of the exam is performed with the patient in reverse Trendelenburg.  +---------+---------------+---------+-----------+----------+--------------+ RIGHT    CompressibilityPhasicitySpontaneityPropertiesThrombus Aging +---------+---------------+---------+-----------+----------+--------------+ CFV      Full           Yes      Yes                                 +---------+---------------+---------+-----------+----------+--------------+ SFJ      Full                                                        +---------+---------------+---------+-----------+----------+--------------+  FV Prox  Full                                                        +---------+---------------+---------+-----------+----------+--------------+ FV Mid   Full                                                        +---------+---------------+---------+-----------+----------+--------------+ FV DistalFull                                                        +---------+---------------+---------+-----------+----------+--------------+ PFV      Full                                                        +---------+---------------+---------+-----------+----------+--------------+ POP      Full           Yes      Yes                                 +---------+---------------+---------+-----------+----------+--------------+ PTV      Full                                                         +---------+---------------+---------+-----------+----------+--------------+ PERO     Full                                                        +---------+---------------+---------+-----------+----------+--------------+   +---------+---------------+---------+-----------+----------+--------------+ LEFT     CompressibilityPhasicitySpontaneityPropertiesThrombus Aging +---------+---------------+---------+-----------+----------+--------------+ CFV      Full           Yes      Yes                                 +---------+---------------+---------+-----------+----------+--------------+ SFJ      Full                                                        +---------+---------------+---------+-----------+----------+--------------+ FV Prox  Full                                                        +---------+---------------+---------+-----------+----------+--------------+  FV Mid   Full                                                        +---------+---------------+---------+-----------+----------+--------------+ FV DistalFull                                                        +---------+---------------+---------+-----------+----------+--------------+ PFV      Full                                                        +---------+---------------+---------+-----------+----------+--------------+ POP      Full           Yes      Yes                                 +---------+---------------+---------+-----------+----------+--------------+ PTV      Full                                                        +---------+---------------+---------+-----------+----------+--------------+ PERO     Full                                                        +---------+---------------+---------+-----------+----------+--------------+     Summary: BILATERAL: - No evidence of deep vein thrombosis seen in the  lower extremities, bilaterally. -   *See table(s) above for measurements and observations. Electronically signed by Heath Lark on 05/18/2020 at 8:52:19 AM.    Final      Scheduled Meds: . amoxicillin  500 mg Oral Q8H  . atorvastatin  40 mg Per Tube Daily  . Chlorhexidine Gluconate Cloth  6 each Topical Daily  . insulin aspart  0-20 Units Subcutaneous Q4H  . insulin aspart  5 Units Subcutaneous Q4H  . insulin glargine  22 Units Subcutaneous QHS  . levETIRAcetam  500 mg Oral BID  . metoprolol tartrate  12.5 mg Per Tube BID  . sodium chloride flush  10-40 mL Intracatheter Q12H   Continuous Infusions: . feeding supplement (GLUCERNA 1.5 CAL)      Active Problems:   Sepsis (HCC)   Hydronephrosis with renal and ureteral calculus obstruction   Leukocytosis   Lactic acidosis   Hypoalbuminemia   Hyperglycemia   Acute kidney injury superimposed on CKD (HCC)   S/P cystoscopy with ureteral stent placement   Hydronephrosis with obstructing calculus     Horatio Pel [place] Flaming, [facility]  If 7PM-7AM, please contact night-coverage [email or web address]   LOS: 7 days

## 2020-05-19 DIAGNOSIS — Z960 Presence of urogenital implants: Secondary | ICD-10-CM | POA: Diagnosis not present

## 2020-05-19 DIAGNOSIS — R6521 Severe sepsis with septic shock: Secondary | ICD-10-CM

## 2020-05-19 DIAGNOSIS — N132 Hydronephrosis with renal and ureteral calculous obstruction: Secondary | ICD-10-CM | POA: Diagnosis not present

## 2020-05-19 DIAGNOSIS — A419 Sepsis, unspecified organism: Secondary | ICD-10-CM | POA: Diagnosis not present

## 2020-05-19 DIAGNOSIS — D72829 Elevated white blood cell count, unspecified: Secondary | ICD-10-CM | POA: Diagnosis not present

## 2020-05-19 LAB — CBC WITH DIFFERENTIAL/PLATELET
Abs Immature Granulocytes: 0.39 10*3/uL — ABNORMAL HIGH (ref 0.00–0.07)
Basophils Absolute: 0.1 10*3/uL (ref 0.0–0.1)
Basophils Relative: 0 %
Eosinophils Absolute: 0.3 10*3/uL (ref 0.0–0.5)
Eosinophils Relative: 2 %
HCT: 32.4 % — ABNORMAL LOW (ref 36.0–46.0)
Hemoglobin: 10.3 g/dL — ABNORMAL LOW (ref 12.0–15.0)
Immature Granulocytes: 2 %
Lymphocytes Relative: 19 %
Lymphs Abs: 3.2 10*3/uL (ref 0.7–4.0)
MCH: 30.4 pg (ref 26.0–34.0)
MCHC: 31.8 g/dL (ref 30.0–36.0)
MCV: 95.6 fL (ref 80.0–100.0)
Monocytes Absolute: 1.1 10*3/uL — ABNORMAL HIGH (ref 0.1–1.0)
Monocytes Relative: 7 %
Neutro Abs: 11.6 10*3/uL — ABNORMAL HIGH (ref 1.7–7.7)
Neutrophils Relative %: 70 %
Platelets: 250 10*3/uL (ref 150–400)
RBC: 3.39 MIL/uL — ABNORMAL LOW (ref 3.87–5.11)
RDW: 13.6 % (ref 11.5–15.5)
WBC: 16.7 10*3/uL — ABNORMAL HIGH (ref 4.0–10.5)
nRBC: 0.5 % — ABNORMAL HIGH (ref 0.0–0.2)

## 2020-05-19 LAB — BASIC METABOLIC PANEL
Anion gap: 6 (ref 5–15)
BUN: 25 mg/dL — ABNORMAL HIGH (ref 6–20)
CO2: 29 mmol/L (ref 22–32)
Calcium: 8.5 mg/dL — ABNORMAL LOW (ref 8.9–10.3)
Chloride: 103 mmol/L (ref 98–111)
Creatinine, Ser: 0.84 mg/dL (ref 0.44–1.00)
GFR, Estimated: >60 mL/min (ref >60)
Glucose, Bld: 223 mg/dL — ABNORMAL HIGH (ref 70–99)
Potassium: 4.5 mmol/L (ref 3.5–5.1)
Sodium: 138 mmol/L (ref 135–145)

## 2020-05-19 LAB — MAGNESIUM: Magnesium: 1.8 mg/dL (ref 1.7–2.4)

## 2020-05-19 LAB — GLUCOSE, CAPILLARY
Glucose-Capillary: 200 mg/dL — ABNORMAL HIGH (ref 70–99)
Glucose-Capillary: 202 mg/dL — ABNORMAL HIGH (ref 70–99)
Glucose-Capillary: 202 mg/dL — ABNORMAL HIGH (ref 70–99)
Glucose-Capillary: 213 mg/dL — ABNORMAL HIGH (ref 70–99)
Glucose-Capillary: 246 mg/dL — ABNORMAL HIGH (ref 70–99)

## 2020-05-19 NOTE — Progress Notes (Signed)
Patient ID: Julie Donovan, female   DOB: 1965-08-27, 55 y.o.   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  Work-up revealed Proteus bacteremia secondary to complicated UTI with bilateral Hydro nephrosis requiring bilateral ureteral stent placement.  She was initially in shock requiring Levophed and ICU care and was transferred out of ICU on 05/12/2020.  Assessment & Plan:   Septic shock: Present on admission; resolved Complicated UTI with bilateral hydronephrosis with nephrolithiasis status post stents Proteus bacteremia -Currently hemodynamically stable with resolved sepsis.  Continue amoxicillin as per ID recommendations. -Outpatient follow-up with urology and ID  Leukocytosis--monitor  Diabetes mellitus type 2 uncontrolled with hyperglycemia -Continue Lantus along with CBGs with SSI and NovoLog with tube feeds  Acute metabolic encephalopathy Dementia History of unspecified CVA with right-sided deficits and aphasia Dysphagia -Mental status probably back to her baseline.  Monitor.  Fall precautions. -Still slow to respond -EEG and MRI were negative for acute findings -Still getting tube feedings.  SLP following.  If swallowing does not improve, might have to consider PEG tube feeding. -Continue statin -Palliative care consultation for goals of care discussion  Seizure disorder -Continue Keppra  Morbid obesity-outpatient follow-up  DVT prophylaxis: SCDs Code Status: Full Family Communication: None at bedside Disposition Plan: Status is: Inpatient  Remains inpatient appropriate because:Inpatient  level of care appropriate due to severity of illness   Dispo: The patient is from: SNF              Anticipated d/c is to: SNF              Patient currently is not medically stable to d/c.   Difficult to place patient No  Consultants: ID/PCCM/urology  Procedures: Cystoscopy and stent placement  Antimicrobials:  Anti-infectives (From admission, onward)   Start     Dose/Rate Route Frequency Ordered Stop   05/15/20 1400  amoxicillin (AMOXIL) 250 MG/5ML suspension 500 mg        500 mg Oral Every 8 hours 05/15/20 1054 06/06/20 2359   05/14/20 1200  ampicillin (OMNIPEN) 2 g in sodium chloride 0.9 % 100 mL IVPB  Status:  Discontinued        2 g 300 mL/hr over 20 Minutes Intravenous Every 4 hours 05/13/20 1403 05/15/20 1054   05/12/20 1500  cefTRIAXone (ROCEPHIN) 2 g in sodium chloride 0.9 % 100 mL IVPB  Status:  Discontinued        2 g 200 mL/hr over 30 Minutes Intravenous Every 24 hours 05/12/20 1042 05/13/20 1403   05/11/20 2100  ceFEPIme (MAXIPIME) 2 g in sodium chloride 0.9 % 100 mL IVPB  Status:  Discontinued        2 g 200 mL/hr over 30 Minutes Intravenous Every 24 hours 05/10/20 2046 05/11/20 1425   05/11/20 1515  ceFEPIme (MAXIPIME) 2 g in sodium chloride 0.9 % 100 mL IVPB  Status:  Discontinued        2 g 200 mL/hr over 30 Minutes Intravenous Every 12 hours 05/11/20 1425 05/12/20 1042   05/11/20 0200  cefOXitin (MEFOXIN) 2 g in sodium chloride 0.9 % 100 mL IVPB        2 g 200 mL/hr over  30 Minutes Intravenous  Once 05/11/20 0150 05/11/20 0232   05/10/20 2100  vancomycin (VANCOREADY) IVPB 2000 mg/400 mL        2,000 mg 200 mL/hr over 120 Minutes Intravenous  Once 05/10/20 2046 05/11/20 0011   05/10/20 2045  vancomycin variable dose per unstable renal function (pharmacist dosing)  Status:  Discontinued         Does not apply See admin instructions 05/10/20 2046 05/12/20 1042   05/10/20 1930  ceFEPIme (MAXIPIME) 2 g in sodium chloride 0.9 % 100 mL IVPB  Status:  Discontinued         2 g 200 mL/hr over 30 Minutes Intravenous  Once 05/10/20 1921 05/12/20 1042   05/10/20 1930  metroNIDAZOLE (FLAGYL) IVPB 500 mg        500 mg 100 mL/hr over 60 Minutes Intravenous  Once 05/10/20 1921 05/10/20 2111   05/10/20 1930  vancomycin (VANCOREADY) IVPB 1000 mg/200 mL  Status:  Discontinued        1,000 mg 200 mL/hr over 60 Minutes Intravenous  Once 05/10/20 1921 05/10/20 2046       Subjective: Patient seen and examined at bedside.  Very poor historian.  Awake, hardly participates in any conversation.  No overnight fever or vomiting reported.  Nursing staff reported choking episodes earlier today.  Objective: Vitals:   05/19/20 0400 05/19/20 0500 05/19/20 0758 05/19/20 1156  BP: (!) 141/73  (!) 150/109 123/62  Pulse: 90  86 81  Resp: (!) 21  20 (!) 25  Temp: 97.7 F (36.5 C)  98 F (36.7 C) 97.7 F (36.5 C)  TempSrc: Oral  Oral Axillary  SpO2: 95%  95% 94%  Weight:  110.8 kg    Height:        Intake/Output Summary (Last 24 hours) at 05/19/2020 1310 Last data filed at 05/19/2020 1157 Gross per 24 hour  Intake 810 ml  Output 460 ml  Net 350 ml   Filed Weights   05/17/20 0334 05/18/20 0300 05/19/20 0500  Weight: 109.8 kg 111.9 kg 110.8 kg    Examination:  General exam: Looks chronically ill, deconditioned and older than stated age.  No distress currently.  Currently on room air QPR:FFMBWGY with feeding ongoing Respiratory system: Bilateral decreased breath sounds at bases with scattered crackles  Cardiovascular system: S1 & S2 heard, Rate controlled Gastrointestinal system: Abdomen is morbidly obese, nondistended, soft and nontender. Normal bowel sounds heard. Extremities: No cyanosis, clubbing; lower extremity edema present Central nervous system: Awake, hardly participates in any conversation.very slow to respond.  Right-sided weakness present  skin: No rashes, lesions or ulcers Psychiatry: Extremely flat affect    Data Reviewed: I have personally  reviewed following labs and imaging studies  CBC: Recent Labs  Lab 05/15/20 0339 05/16/20 0352 05/17/20 0257 05/18/20 0341 05/19/20 0348  WBC 15.6* 16.7* 15.0* 15.1* 16.7*  NEUTROABS  --   --  9.4*  --  11.6*  HGB 11.3* 10.8* 10.7* 10.4* 10.3*  HCT 35.2* 34.0* 33.9* 33.3* 32.4*  MCV 94.4 95.5 96.0 96.2 95.6  PLT 77* 110* 167 215 250   Basic Metabolic Panel: Recent Labs  Lab 05/13/20 0500 05/13/20 1739 05/14/20 0233 05/15/20 0339 05/16/20 0352 05/17/20 0257 05/18/20 0341 05/19/20 0348  NA 141  --  138 135  137 138 136 137 138  K 3.7  --  3.9 4.3  4.2 4.0 4.2 4.5 4.5  CL 109  --  105 101  103 105 102 104 103  CO2  24  --  GLUCOSE 176*  --  245* 314*  320* 139* 229* 270* 223*  BUN 24*  --  25*  CREATININE 1.16*  --  0.85 0.83  0.80 0.75 0.83 0.88 0.84  CALCIUM 8.2*  --  7.8* 7.5*  7.7* 7.9* 8.0* 8.2* 8.5*  MG 1.6* 1.7 1.5*  --  1.5* 1.7 1.9 1.8  PHOS <1.0* 2.4* 1.8*  --  1.5* 2.7  --   --    GFR: Estimated Creatinine Clearance: 92.1 mL/min (by C-G formula based on SCr of 0.84 mg/dL). Liver Function Tests: Recent Labs  Lab 05/13/20 0746 05/15/20 0339  AST 34 28  ALT 29 34  ALKPHOS 161* 201*  BILITOT 0.5 0.7  PROT 5.3* 5.5*  ALBUMIN 2.3* 2.3*   No results for input(s): LIPASE, AMYLASE in the last 168 hours. No results for input(s): AMMONIA in the last 168 hours. Coagulation Profile: No results for input(s): INR, PROTIME in the last 168 hours. Cardiac Enzymes: Recent Labs  Lab 05/13/20 0746  CKTOTAL 155   BNP (last 3 results) No results for input(s): PROBNP in the last 8760 hours. HbA1C: No results for input(s): HGBA1C in the last 72 hours. CBG: Recent Labs  Lab 05/18/20 1952 05/18/20 2157 05/18/20 2319 05/19/20 0428 05/19/20 0802  GLUCAP 289* 282* 263* 202* 202*   Lipid Profile: No results for input(s): CHOL, HDL, LDLCALC, TRIG, CHOLHDL, LDLDIRECT in the last 72 hours. Thyroid Function  Tests: No results for input(s): TSH, T4TOTAL, FREET4, T3FREE, THYROIDAB in the last 72 hours. Anemia Panel: No results for input(s): VITAMINB12, FOLATE, FERRITIN, TIBC, IRON, RETICCTPCT in the last 72 hours. Sepsis Labs: Recent Labs  Lab 05/13/20 0707 05/14/20 1229  LATICACIDVEN 2.2* 2.3*    Recent Results (from the past 240 hour(s))  Culture, blood (Routine x 2)     Status: Abnormal   Collection Time: 05/10/20  7:20 PM   Specimen: BLOOD  Result Value Ref Range Status   Specimen Description BLOOD SITE NOT SPECIFIED  Final   Special Requests   Final    BOTTLES DRAWN AEROBIC AND ANAEROBIC Blood Culture results may not be optimal due to an inadequate volume of blood received in culture bottles   Culture  Setup Time (A)  Final    GRAM VARIABLE ROD IN BOTH AEROBIC AND ANAEROBIC BOTTLES CRITICAL RESULT CALLED TO, READ BACK BY AND VERIFIED WITH: PHARMD JESSICA M. [id number] FCP Performed at [facility], [address] [place], [place] [address]    Culture PROTEUS MIRABILIS (A)  Final   Report Status 05/13/2020 FINAL  Final   Organism ID, Bacteria PROTEUS MIRABILIS  Final      Susceptibility   Proteus mirabilis - MIC*    AMPICILLIN <=2 SENSITIVE Sensitive     CEFAZOLIN 8 SENSITIVE Sensitive     CEFEPIME <=0.12 SENSITIVE Sensitive     CEFTAZIDIME <=1 SENSITIVE Sensitive     CEFTRIAXONE <=0.25 SENSITIVE Sensitive     CIPROFLOXACIN >=4 RESISTANT Resistant     GENTAMICIN <=1 SENSITIVE Sensitive     IMIPENEM 2 SENSITIVE Sensitive     TRIMETH/SULFA <=20 SENSITIVE Sensitive     AMPICILLIN/SULBACTAM <=2 SENSITIVE Sensitive     PIP/TAZO <=4 SENSITIVE Sensitive     * PROTEUS MIRABILIS  Blood Culture ID Panel (Reflexed)     Status: Abnormal   Collection Time: 05/10/20  7:20 PM  Result Value  Ref Range Status   Enterococcus faecalis NOT DETECTED NOT DETECTED Final   Enterococcus Faecium NOT DETECTED NOT DETECTED Final   Listeria monocytogenes NOT DETECTED NOT DETECTED Final    Staphylococcus species NOT DETECTED NOT DETECTED Final   Staphylococcus aureus (BCID) NOT DETECTED NOT DETECTED Final   Staphylococcus epidermidis NOT DETECTED NOT DETECTED Final   Staphylococcus lugdunensis NOT DETECTED NOT DETECTED Final   Streptococcus species NOT DETECTED NOT DETECTED Final   Streptococcus agalactiae NOT DETECTED NOT DETECTED Final   Streptococcus pneumoniae NOT DETECTED NOT DETECTED Final   Streptococcus pyogenes NOT DETECTED NOT DETECTED Final   A.calcoaceticus-baumannii NOT DETECTED NOT DETECTED Final   Bacteroides fragilis NOT DETECTED NOT DETECTED Final   Enterobacterales DETECTED (A) NOT DETECTED Final    Comment: Enterobacterales represent a large order of gram negative bacteria, not a single organism. CRITICAL RESULT CALLED TO, READ BACK BY AND VERIFIED WITH: PHARMD JESSICA M. 1207 [phone] FCP    Enterobacter cloacae complex NOT DETECTED NOT DETECTED Final   Escherichia coli NOT DETECTED NOT DETECTED Final   Klebsiella aerogenes NOT DETECTED NOT DETECTED Final   Klebsiella oxytoca NOT DETECTED NOT DETECTED Final   Klebsiella pneumoniae NOT DETECTED NOT DETECTED Final   Proteus species DETECTED (A) NOT DETECTED Final    Comment: CRITICAL RESULT CALLED TO, READ BACK BY AND VERIFIED WITH: PHARMD JESSICA M. 1207 [phone] FCP    Salmonella species NOT DETECTED NOT DETECTED Final   Serratia marcescens NOT DETECTED NOT DETECTED Final   Haemophilus influenzae NOT DETECTED NOT DETECTED Final   Neisseria meningitidis NOT DETECTED NOT DETECTED Final   Pseudomonas aeruginosa NOT DETECTED NOT DETECTED Final   Stenotrophomonas maltophilia NOT DETECTED NOT DETECTED Final   Candida albicans NOT DETECTED NOT DETECTED Final   Candida auris NOT DETECTED NOT DETECTED Final   Candida glabrata NOT DETECTED NOT DETECTED Final   Candida krusei NOT DETECTED NOT DETECTED Final   Candida parapsilosis NOT DETECTED NOT DETECTED Final   Candida tropicalis NOT DETECTED NOT DETECTED  Final   Cryptococcus neoformans/gattii NOT DETECTED NOT DETECTED Final   CTX-M ESBL NOT DETECTED NOT DETECTED Final   Carbapenem resistance IMP NOT DETECTED NOT DETECTED Final   Carbapenem resistance KPC NOT DETECTED NOT DETECTED Final   Carbapenem resistance NDM NOT DETECTED NOT DETECTED Final   Carbapenem resist OXA 48 LIKE NOT DETECTED NOT DETECTED Final   Carbapenem resistance VIM NOT DETECTED NOT DETECTED Final    Comment: Performed at [facility], [address] [place], [place] [id number]  Resp Panel by RT-PCR (Flu A&B, Covid) Nasopharyngeal Swab     Status: None   Collection Time: 05/10/20  7:45 PM   Specimen: Nasopharyngeal Swab; Nasopharyngeal(NP) swabs in vial transport medium  Result Value Ref Range Status   SARS Coronavirus 2 by RT PCR NEGATIVE NEGATIVE Final    Comment: (NOTE) SARS-CoV-2 target nucleic acids are NOT DETECTED.  The SARS-CoV-2 RNA is generally detectable in upper respiratory specimens during the acute phase of infection. The lowest concentration of SARS-CoV-2 viral copies this assay can detect is 138 copies/mL. A negative result does not preclude SARS-Cov-2 infection and should not be used as the sole basis for treatment or other patient management decisions. A negative result may occur with  improper specimen collection/handling, submission of specimen other than nasopharyngeal swab, presence of viral mutation(s) within the areas targeted by this assay, and inadequate number of viral copies(<138 copies/mL). A negative result must be combined with clinical observations, patient history,  and epidemiological information. The expected result is Negative.  Fact Sheet for Patients:  BloggerCourse.com  Fact Sheet for Healthcare Providers:  SeriousBroker.it  This test is no t yet approved or cleared by the [place] FDA and  has been authorized for detection and/or diagnosis of SARS-CoV-2 by FDA  under an Emergency Use Authorization (EUA). This EUA will remain  in effect (meaning this test can be used) for the duration of the COVID-19 declaration under Section 564(b)(1) of the Act, 21 U.S.C.section 360bbb-3(b)(1), unless the authorization is terminated  or revoked sooner.       Influenza A by PCR NEGATIVE NEGATIVE Final   Influenza B by PCR NEGATIVE NEGATIVE Final    Comment: (NOTE) The Xpert Xpress SARS-CoV-2/FLU/RSV plus assay is intended as an aid in the diagnosis of influenza from Nasopharyngeal swab specimens and should not be used as a sole basis for treatment. Nasal washings and aspirates are unacceptable for Xpert Xpress SARS-CoV-2/FLU/RSV testing.  Fact Sheet for Patients: BloggerCourse.com  Fact Sheet for Healthcare Providers: SeriousBroker.it  This test is not yet approved or cleared by the [place] FDA and has been authorized for detection and/or diagnosis of SARS-CoV-2 by FDA under an Emergency Use Authorization (EUA). This EUA will remain in effect (meaning this test can be used) for the duration of the COVID-19 declaration under Section 564(b)(1) of the Act, 21 U.S.C. section 360bbb-3(b)(1), unless the authorization is terminated or revoked.  Performed at [facility], [address] [place], [place] [address]   Culture, blood (Routine x 2)     Status: Abnormal   Collection Time: 05/10/20  9:06 PM   Specimen: BLOOD  Result Value Ref Range Status   Specimen Description BLOOD SITE NOT SPECIFIED  Final   Special Requests   Final    BOTTLES DRAWN AEROBIC AND ANAEROBIC Blood Culture results may not be optimal due to an inadequate volume of blood received in culture bottles   Culture  Setup Time (A)  Final    GRAM VARIABLE ROD IN BOTH AEROBIC AND ANAEROBIC BOTTLES CRITICAL VALUE NOTED.  VALUE IS CONSISTENT WITH PREVIOUSLY REPORTED AND CALLED VALUE.    Culture (A)  Final    PROTEUS  MIRABILIS SUSCEPTIBILITIES PERFORMED ON PREVIOUS CULTURE WITHIN THE LAST 5 DAYS. Performed at [facility], [address], [place], [place] [address]    Report Status 05/13/2020 FINAL  Final  Urine Culture     Status: Abnormal   Collection Time: 05/11/20  2:07 AM   Specimen: Urine, Cystoscope  Result Value Ref Range Status   Specimen Description CYSTOSCOPY URINE, CATHETERIZED  Final   Special Requests   Final    PATIENT ON FOLLOWING MAXAPIME Performed at [facility], [address], [place], [place] [address]    Culture >=100,000 COLONIES/mL PROTEUS MIRABILIS (A)  Final   Report Status 05/13/2020 FINAL  Final   Organism ID, Bacteria PROTEUS MIRABILIS (A)  Final      Susceptibility   Proteus mirabilis - MIC*    AMPICILLIN <=2 SENSITIVE Sensitive     CEFAZOLIN 8 SENSITIVE Sensitive     CEFEPIME <=0.12 SENSITIVE Sensitive     CEFTAZIDIME <=1 SENSITIVE Sensitive     CEFTRIAXONE <=0.25 SENSITIVE Sensitive     CIPROFLOXACIN >=4 RESISTANT Resistant     GENTAMICIN <=1 SENSITIVE Sensitive     IMIPENEM 2 SENSITIVE Sensitive     TRIMETH/SULFA <=20 SENSITIVE Sensitive     AMPICILLIN/SULBACTAM <=2 SENSITIVE Sensitive  PIP/TAZO <=4 SENSITIVE Sensitive     * >=100,000 COLONIES/mL PROTEUS MIRABILIS  MRSA PCR Screening     Status: None   Collection Time: 05/11/20  5:37 AM   Specimen: Nasal Mucosa; Nasopharyngeal  Result Value Ref Range Status   MRSA by PCR NEGATIVE NEGATIVE Final    Comment:        The GeneXpert MRSA Assay (FDA approved for NASAL specimens only), is one component of a comprehensive MRSA colonization surveillance program. It is not intended to diagnose MRSA infection nor to guide or monitor treatment for MRSA infections. Performed at [facility], [address] [place], [place] [address]   Culture, blood (routine x 2)     Status: None   Collection Time: 05/12/20 10:50 AM   Specimen: BLOOD  Result Value Ref Range Status   Specimen Description  BLOOD LEFT ANTECUBITAL  Final   Special Requests   Final    BOTTLES DRAWN AEROBIC AND ANAEROBIC Blood Culture adequate volume   Culture   Final    NO GROWTH 5 DAYS Performed at [facility], [address] [place], [place] [address]    Report Status 05/17/2020 FINAL  Final  Culture, blood (routine x 2)     Status: None   Collection Time: 05/12/20 10:55 AM   Specimen: BLOOD  Result Value Ref Range Status   Specimen Description BLOOD LEFT ANTECUBITAL  Final   Special Requests   Final    BOTTLES DRAWN AEROBIC AND ANAEROBIC Blood Culture adequate volume   Culture   Final    NO GROWTH 5 DAYS Performed at [facility], [address], [place], [place] [address]    Report Status 05/17/2020 FINAL  Final         Radiology Studies: VAS [place] LOWER EXTREMITY VENOUS (DVT)  Result Date: 05/18/2020  Lower Venous DVT Study Patient Name:  NOHA MILBERGER  Date of Exam:   05/17/2020 Medical Rec #: [id number]      Accession #:    [id number] Date of Birth: Jan 30, 1965      Patient Gender: F Patient Age:   69Y Exam Location:  [facility] Procedure:      VAS [place] LOWER EXTREMITY VENOUS (DVT) Referring Phys: [id number] FANG XU --------------------------------------------------------------------------------  Indications: Edema.  Comparison Study: No prior study Performing Technologist: Sherren Kerns RVS  Examination Guidelines: A complete evaluation includes B-mode imaging, spectral Doppler, color Doppler, and power Doppler as needed of all accessible portions of each vessel. Bilateral testing is considered an integral part of a complete examination. Limited examinations for reoccurring indications may be performed as noted. The reflux portion of the exam is performed with the patient in reverse Trendelenburg.  +---------+---------------+---------+-----------+----------+--------------+ RIGHT    CompressibilityPhasicitySpontaneityPropertiesThrombus Aging  +---------+---------------+---------+-----------+----------+--------------+ CFV      Full           Yes      Yes                                 +---------+---------------+---------+-----------+----------+--------------+ SFJ      Full                                                        +---------+---------------+---------+-----------+----------+--------------+ FV Prox  Full                                                        +---------+---------------+---------+-----------+----------+--------------+  FV Mid   Full                                                        +---------+---------------+---------+-----------+----------+--------------+ FV DistalFull                                                        +---------+---------------+---------+-----------+----------+--------------+ PFV      Full                                                        +---------+---------------+---------+-----------+----------+--------------+ POP      Full           Yes      Yes                                 +---------+---------------+---------+-----------+----------+--------------+ PTV      Full                                                        +---------+---------------+---------+-----------+----------+--------------+ PERO     Full                                                        +---------+---------------+---------+-----------+----------+--------------+   +---------+---------------+---------+-----------+----------+--------------+ LEFT     CompressibilityPhasicitySpontaneityPropertiesThrombus Aging +---------+---------------+---------+-----------+----------+--------------+ CFV      Full           Yes      Yes                                 +---------+---------------+---------+-----------+----------+--------------+ SFJ      Full                                                         +---------+---------------+---------+-----------+----------+--------------+ FV Prox  Full                                                        +---------+---------------+---------+-----------+----------+--------------+ FV Mid   Full                                                        +---------+---------------+---------+-----------+----------+--------------+  FV DistalFull                                                        +---------+---------------+---------+-----------+----------+--------------+ PFV      Full                                                        +---------+---------------+---------+-----------+----------+--------------+ POP      Full           Yes      Yes                                 +---------+---------------+---------+-----------+----------+--------------+ PTV      Full                                                        +---------+---------------+---------+-----------+----------+--------------+ PERO     Full                                                        +---------+---------------+---------+-----------+----------+--------------+     Summary: BILATERAL: - No evidence of deep vein thrombosis seen in the lower extremities, bilaterally. -   *See table(s) above for measurements and observations. Electronically signed by Heath Lark on 05/18/2020 at 8:52:19 AM.    Final         Scheduled Meds: . amoxicillin  500 mg Oral Q8H  . atorvastatin  40 mg Per Tube Daily  . Chlorhexidine Gluconate Cloth  6 each Topical Daily  . insulin aspart  0-20 Units Subcutaneous Q4H  . insulin aspart  5 Units Subcutaneous Q4H  . insulin glargine  26 Units Subcutaneous QHS  . levETIRAcetam  500 mg Oral BID  . metoprolol tartrate  12.5 mg Per Tube BID  . sodium chloride flush  10-40 mL Intracatheter Q12H   Continuous Infusions: . feeding supplement (GLUCERNA 1.5 CAL) 1,000 mL (05/19/20 0541)          Glade Lloyd,  MD [facility] 05/19/2020, 1:10 PM

## 2020-05-19 NOTE — Progress Notes (Signed)
Secure chat note sent to Attending and Speech: Waiting for further orders.  Noc shift nurse claimed in report this am that pt was choking and coughing when given water and ice chips. So she gave meds via cortrak. I see that this pt has a diet order and I know she has been evaluated by speech. However, I am going to hold off giving her anything by mouth until she is re-evaluated. Thanks

## 2020-05-19 NOTE — Progress Notes (Signed)
  Speech Language Pathology Treatment: Dysphagia  Patient Details Name: Julie Donovan MRN: [id number] DOB: [birth year] Today's Date: 05/19/2020 Time: 4098-1191 SLP Time Calculation (min) (ACUTE ONLY): 10 min  Assessment / Plan / Recommendation Clinical Impression  RN reported coughing and choking with water and ice chips overnight.  AM nurse has held POs and administered medications via cortrak, pending SLP reassessment.  Today there was a mild throat clear following initial trial of thin liquid by cup.  There were no clinical s/s of aspiration with any additional trails, including serial sips of thin liquid by straw.  Pt exhibited excellent oral clearance of modified solids given extra time.  With both simulated ground and soft solid textures there was trace to no oral residue.  Pt is agreeable to diet upgrade, but requests sauces.  Extra sauce/gravies recommended per pt preference which will likely also increase ease of mastication and improve oral clearance.  Recommend mechanical soft (dysphagia 3) diet with thin liquids.  Please ensure pt is fully awake/alert prior to providing POs.     HPI HPI: Pt is a 55 y.o. female with medical history significant for COPD, CHF, CVA with right residual deficit, dementia, hypertension, T2DM, CKD 3A and morbid obesity who presented to the ED due to AMS. Pt found to have leukocytosis and obstructing nephrolithiasis with bilateral hydronephrosis. Pt was taken to OR for stent placement and subsequently has become hypotensive. Dx sepsis and UTI. EEG 4/20: mild to moderate diffuse encephalopathy, nonspecific etiology. No seizures or epileptiform discharges. Pt was made NPO on admission and Cortrak placed 4/18.MRI brain 4/20: no acute intracranial abnormality      SLP Plan  Continue with current plan of care       Recommendations  Diet recommendations: Dysphagia 3 (mechanical soft);Thin liquid Liquids provided via: Cup;Straw Medication Administration:  Crushed with puree (As tolerated) Supervision: Trained caregiver to feed patient Compensations: Minimize environmental distractions;Slow rate;Small sips/bites (Ensure pt is fully awake/alert) Postural Changes and/or Swallow Maneuvers: Seated upright 90 degrees                Oral Care Recommendations: Oral care QID;Staff/trained caregiver to provide oral care Follow up Recommendations:  (Continue ST at next level of care) SLP Visit Diagnosis: Dysphagia, unspecified (R13.10) Plan: Continue with current plan of care       GO                Kerrie Pleasure, MA, CCC-SLP Acute Rehabilitation Services Office: [phone] 05/19/2020, 9:47 AM

## 2020-05-19 NOTE — Progress Notes (Incomplete)
Inpatient Diabetes Program Recommendations  AACE/ADA: New Consensus Statement on Inpatient Glycemic Control (2015)  Target Ranges:  Prepandial:   less than 140 mg/dL      Peak postprandial:   less than 180 mg/dL (1-2 hours)      Critically ill patients:  140 - 180 mg/dL   Lab Results  Component Value Date   GLUCAP 202 (H) 05/19/2020    Review of Glycemic Control Results for Julie Donovan, Julie Donovan (MRN [id number]) as of 05/19/2020 14:11  Ref. Range 05/18/2020 19:52 05/18/2020 21:57 05/18/2020 23:19 05/19/2020 04:28 05/19/2020 08:02  Glucose-Capillary Latest Ref Range: 70 - 99 mg/dL 856 (H) 314 (H) 970 (H) 202 (H) 202 (H)   Diabetes history: DM 2 Outpatient Diabetes medications:  Lantus 41 units daily, Humalog 2-12 units qid Tradjenta 5 mg daily Current orders for Inpatient glycemic control:  Novolog resistant q 4 hours Lantus 26 units daily Novolog 5 units q 4 hours Inpatient Diabetes Program Recommendations:

## 2020-05-19 NOTE — TOC Initial Note (Addendum)
Transition of Care [facility]) - Initial/Assessment Note    Patient Details  Name: Julie Donovan MRN: [id number] Date of Birth: 02/04/1965  [facility] CM/SW Contact:    Erin Sons, LCSW Phone Number: 05/19/2020, 2:46 PM  Clinical Narrative:                  Pt is not oriented; CSW returned call from pt sister. Pt sister spoke about transferring pt to a SNF closer to [place], [place] about 2 hours away from [place] where pt sister lives. CSW explained that securing LTC at SNF can be a lengthy process and that he would be able to fax referrals but that finding another facility would not delay d/c. CSW explained he would fax referrals if she identified facilities she would like. CSW provided the medicare.gov website and instructions on how to search for SNF's. CSW also informed sister that insurance coverage of ambulance transport of that distance may be limited. Sister expressed an understanding that pt's discharge would not be delayed since pt has a safe discharge plan to [place]. CSW provided pt sister with his contact information. TOC will continue to follow.   1532: Pt sister called and requested CSW contact [name] with [facility] in [place] at [phone]. CSW called; no answer and voicemailbox is full.   Expected Discharge Plan: Skilled Nursing Facility Barriers to Discharge: Continued Medical Work up   Patient Goals and CMS Choice        Expected Discharge Plan and Services Expected Discharge Plan: Skilled Nursing Facility       Living arrangements for the past 2 months: Skilled Nursing Facility                                      Prior Living Arrangements/Services Living arrangements for the past 2 months: Skilled Nursing Facility Lives with:: Facility Resident          Need for Family Participation in Patient Care: Yes (Comment) Care giver support system in place?: No (comment)      Activities of Daily Living Home Assistive  Devices/Equipment: None ADL Screening (condition at time of admission) Patient's cognitive ability adequate to safely complete daily activities?: No Is the patient deaf or have difficulty hearing?: No Does the patient have difficulty seeing, even when wearing glasses/contacts?: No Does the patient have difficulty concentrating, remembering, or making decisions?: Yes Patient able to express need for assistance with ADLs?: Yes Does the patient have difficulty dressing or bathing?: Yes Independently performs ADLs?: No Communication: Dependent Does the patient have difficulty walking or climbing stairs?: Yes Weakness of Legs: Both Weakness of Arms/Hands: Both  Permission Sought/Granted                  Emotional Assessment       Orientation: : Oriented to Self,Oriented to Place Alcohol / Substance Use: Not Applicable Psych Involvement: No (comment)  Admission diagnosis:  AKI (acute kidney injury) (HCC) [N17.9] Sepsis (HCC) [A41.9] Bilateral ureteral calculi [N20.1] Altered mental status, unspecified altered mental status type [R41.82] Sepsis with encephalopathy without septic shock, due to unspecified organism (HCC) [A41.9, R65.20, G93.40] Hydronephrosis with obstructing calculus [N13.2] Patient Active Problem List   Diagnosis Date Noted  . Sepsis (HCC) 05/11/2020  . Bilateral renal stones 05/11/2020  . Hydronephrosis with renal and ureteral calculus obstruction 05/11/2020  . Leukocytosis 05/11/2020  . Lactic acidosis 05/11/2020  .  Hypoalbuminemia 05/11/2020  . Hyperglycemia 05/11/2020  . Acute kidney injury superimposed on CKD (HCC) 05/11/2020  . S/P cystoscopy with ureteral stent placement 05/11/2020  . Hydronephrosis with obstructing calculus 05/11/2020   PCP:  Karna Dupes, MD Pharmacy:  No Pharmacies Listed    Social Determinants of Health (SDOH) Interventions    Readmission Risk Interventions No flowsheet data found.

## 2020-05-20 DIAGNOSIS — A498 Other bacterial infections of unspecified site: Secondary | ICD-10-CM | POA: Diagnosis not present

## 2020-05-20 DIAGNOSIS — N132 Hydronephrosis with renal and ureteral calculous obstruction: Secondary | ICD-10-CM | POA: Diagnosis not present

## 2020-05-20 DIAGNOSIS — D72829 Elevated white blood cell count, unspecified: Secondary | ICD-10-CM | POA: Diagnosis not present

## 2020-05-20 DIAGNOSIS — Z960 Presence of urogenital implants: Secondary | ICD-10-CM | POA: Diagnosis not present

## 2020-05-20 LAB — CBC WITH DIFFERENTIAL/PLATELET
Abs Immature Granulocytes: 0.2 10*3/uL — ABNORMAL HIGH (ref 0.00–0.07)
Basophils Absolute: 0.1 10*3/uL (ref 0.0–0.1)
Basophils Relative: 0 %
Eosinophils Absolute: 0.4 10*3/uL (ref 0.0–0.5)
Eosinophils Relative: 3 %
HCT: 32 % — ABNORMAL LOW (ref 36.0–46.0)
Hemoglobin: 10 g/dL — ABNORMAL LOW (ref 12.0–15.0)
Immature Granulocytes: 1 %
Lymphocytes Relative: 21 %
Lymphs Abs: 3 10*3/uL (ref 0.7–4.0)
MCH: 30.2 pg (ref 26.0–34.0)
MCHC: 31.3 g/dL (ref 30.0–36.0)
MCV: 96.7 fL (ref 80.0–100.0)
Monocytes Absolute: 0.9 10*3/uL (ref 0.1–1.0)
Monocytes Relative: 6 %
Neutro Abs: 9.6 10*3/uL — ABNORMAL HIGH (ref 1.7–7.7)
Neutrophils Relative %: 69 %
Platelets: 249 10*3/uL (ref 150–400)
RBC: 3.31 MIL/uL — ABNORMAL LOW (ref 3.87–5.11)
RDW: 13.5 % (ref 11.5–15.5)
WBC: 14.1 10*3/uL — ABNORMAL HIGH (ref 4.0–10.5)
nRBC: 0.5 % — ABNORMAL HIGH (ref 0.0–0.2)

## 2020-05-20 LAB — COMPREHENSIVE METABOLIC PANEL
ALT: 22 U/L (ref 0–44)
AST: 19 U/L (ref 15–41)
Albumin: 2.4 g/dL — ABNORMAL LOW (ref 3.5–5.0)
Alkaline Phosphatase: 138 U/L — ABNORMAL HIGH (ref 38–126)
Anion gap: 8 (ref 5–15)
BUN: 24 mg/dL — ABNORMAL HIGH (ref 6–20)
CO2: 28 mmol/L (ref 22–32)
Calcium: 8.5 mg/dL — ABNORMAL LOW (ref 8.9–10.3)
Chloride: 99 mmol/L (ref 98–111)
Creatinine, Ser: 0.85 mg/dL (ref 0.44–1.00)
GFR, Estimated: >60 mL/min (ref >60)
Glucose, Bld: 219 mg/dL — ABNORMAL HIGH (ref 70–99)
Potassium: 4.6 mmol/L (ref 3.5–5.1)
Sodium: 135 mmol/L (ref 135–145)
Total Bilirubin: 0.2 mg/dL — ABNORMAL LOW (ref 0.3–1.2)
Total Protein: 5.8 g/dL — ABNORMAL LOW (ref 6.5–8.1)

## 2020-05-20 LAB — GLUCOSE, CAPILLARY
Glucose-Capillary: 208 mg/dL — ABNORMAL HIGH (ref 70–99)
Glucose-Capillary: 148 mg/dL — ABNORMAL HIGH (ref 70–99)
Glucose-Capillary: 121 mg/dL — ABNORMAL HIGH (ref 70–99)
Glucose-Capillary: 153 mg/dL — ABNORMAL HIGH (ref 70–99)
Glucose-Capillary: 141 mg/dL — ABNORMAL HIGH (ref 70–99)

## 2020-05-20 LAB — MAGNESIUM: Magnesium: 1.7 mg/dL (ref 1.7–2.4)

## 2020-05-20 MED ORDER — ENSURE ENLIVE PO LIQD
237.0000 mL | Freq: Three times a day (TID) | ORAL | Status: DC
  Administered 2020-05-20 – 2020-05-22 (×5): 237 mL via ORAL

## 2020-05-20 MED ORDER — GLUCERNA 1.5 CAL PO LIQD
600.0000 mL | ORAL | Status: DC
  Administered 2020-05-20: 600 mL
  Filled 2020-05-20 (×2): qty 711

## 2020-05-20 NOTE — Progress Notes (Signed)
Patient ID: Julie Donovan, female   DOB: September 02, 1965, 55 y.o.   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  Work-up revealed Proteus bacteremia secondary to complicated UTI with bilateral Hydro nephrosis requiring bilateral ureteral stent placement.  She was initially in shock requiring Levophed and ICU care and was transferred out of ICU on 05/12/2020.  Assessment & Plan:   Septic shock: Present on admission; resolved Complicated UTI with bilateral hydronephrosis with nephrolithiasis status post stents Proteus bacteremia -Currently hemodynamically stable with resolved sepsis.  Continue amoxicillin as per ID recommendations. -Outpatient follow-up with urology and ID  Leukocytosis--slightly improving; monitor  Diabetes mellitus type 2 uncontrolled with hyperglycemia -Continue Lantus along with CBGs with SSI and NovoLog with tube feeds  Acute metabolic encephalopathy Dementia History of unspecified CVA with right-sided deficits and aphasia Dysphagia -Mental status probably back to her baseline.  Monitor.  Fall precautions. -Still slow to respond -EEG and MRI were negative for acute findings -Still getting tube feedings.  SLP following.  If swallowing does not improve, might have to consider PEG tube feeding. -Continue statin -Palliative care consultation for goals of care discussion  Seizure disorder -Continue Keppra  Morbid obesity-outpatient follow-up  DVT prophylaxis: SCDs Code Status: Full Family Communication: None at bedside Disposition Plan: Status is: Inpatient  Remains inpatient appropriate  because:Inpatient level of care appropriate due to severity of illness   Dispo: The patient is from: SNF              Anticipated d/c is to: SNF              Patient currently is not medically stable to d/c.   Difficult to place patient No  Consultants: ID/PCCM/urology.  Palliative care consultation is pending  Procedures: Cystoscopy and stent placement  Antimicrobials:  Anti-infectives (From admission, onward)   Start     Dose/Rate Route Frequency Ordered Stop   05/15/20 1400  amoxicillin (AMOXIL) 250 MG/5ML suspension 500 mg        500 mg Oral Every 8 hours 05/15/20 1054 06/06/20 2359   05/14/20 1200  ampicillin (OMNIPEN) 2 g in sodium chloride 0.9 % 100 mL IVPB  Status:  Discontinued        2 g 300 mL/hr over 20 Minutes Intravenous Every 4 hours 05/13/20 1403 05/15/20 1054   05/12/20 1500  cefTRIAXone (ROCEPHIN) 2 g in sodium chloride 0.9 % 100 mL IVPB  Status:  Discontinued        2 g 200 mL/hr over 30 Minutes Intravenous Every 24 hours 05/12/20 1042 05/13/20 1403   05/11/20 2100  ceFEPIme (MAXIPIME) 2 g in sodium chloride 0.9 % 100 mL IVPB  Status:  Discontinued        2 g 200 mL/hr over 30 Minutes Intravenous Every 24 hours 05/10/20 2046 05/11/20 1425   05/11/20 1515  ceFEPIme (MAXIPIME) 2 g in sodium chloride 0.9 % 100 mL IVPB  Status:  Discontinued        2 g 200 mL/hr over 30 Minutes Intravenous Every 12 hours 05/11/20 1425 05/12/20 1042   05/11/20 0200  cefOXitin (MEFOXIN) 2 g in sodium chloride 0.9 % 100 mL IVPB  2 g 200 mL/hr over 30 Minutes Intravenous  Once 05/11/20 0150 05/11/20 0232   05/10/20 2100  vancomycin (VANCOREADY) IVPB 2000 mg/400 mL        2,000 mg 200 mL/hr over 120 Minutes Intravenous  Once 05/10/20 2046 05/11/20 0011   05/10/20 2045  vancomycin variable dose per unstable renal function (pharmacist dosing)  Status:  Discontinued         Does not apply See admin instructions 05/10/20 2046 05/12/20 1042   05/10/20 1930  ceFEPIme (MAXIPIME) 2 g in  sodium chloride 0.9 % 100 mL IVPB  Status:  Discontinued        2 g 200 mL/hr over 30 Minutes Intravenous  Once 05/10/20 1921 05/12/20 1042   05/10/20 1930  metroNIDAZOLE (FLAGYL) IVPB 500 mg        500 mg 100 mL/hr over 60 Minutes Intravenous  Once 05/10/20 1921 05/10/20 2111   05/10/20 1930  vancomycin (VANCOREADY) IVPB 1000 mg/200 mL  Status:  Discontinued        1,000 mg 200 mL/hr over 60 Minutes Intravenous  Once 05/10/20 1921 05/10/20 2046       Subjective: Patient seen and examined at bedside.  Very poor historian.  Hardly participates in any conversation.  No overnight vomiting, fever reported per nursing staff. Objective: Vitals:   05/19/20 2338 05/20/20 0324 05/20/20 0525 05/20/20 0754  BP: (!) 158/68 125/60 129/72   Pulse: 94 77 84   Resp:  20 19   Temp: 98.7 F (37.1 C) 97.9 F (36.6 C) 98.7 F (37.1 C) (!) 91 F (32.8 C)  TempSrc: Axillary Axillary Oral   SpO2: 97% 94% 96%   Weight:      Height:        Intake/Output Summary (Last 24 hours) at 05/20/2020 0805 Last data filed at 05/20/2020 0600 Gross per 24 hour  Intake 1757.41 ml  Output 1610 ml  Net 147.41 ml   Filed Weights   05/17/20 0334 05/18/20 0300 05/19/20 0500  Weight: 109.8 kg 111.9 kg 110.8 kg    Examination:  General exam: Looks chronically ill, deconditioned and older than stated age.  On room air currently.  No acute distress  [id number] present with ongoing feeding Respiratory system: Decreased breath sounds at bases bilaterally with some crackles cardiovascular system: Rate controlled, S1-2 heard Gastrointestinal system: Abdomen is morbidly obese, distended, soft and nontender.  Bowel sounds are heard Extremities: Bilateral lower extremity edema present; no cyanosis Central nervous system: Hardly participates in any conversation; extremely slow to respond.  Right-sided weakness present  skin: No obvious ecchymosis/lesions Psychiatry: Flat affect    Data Reviewed: I have personally  reviewed following labs and imaging studies  CBC: Recent Labs  Lab 05/16/20 0352 05/17/20 0257 05/18/20 0341 05/19/20 0348 05/20/20 0230  WBC 16.7* 15.0* 15.1* 16.7* 14.1*  NEUTROABS  --  9.4*  --  11.6* 9.6*  HGB 10.8* 10.7* 10.4* 10.3* 10.0*  HCT 34.0* 33.9* 33.3* 32.4* 32.0*  MCV 95.5 96.0 96.2 95.6 96.7  PLT 110* 167 215 250 249   Basic Metabolic Panel: Recent Labs  Lab 05/13/20 1739 05/14/20 0233 05/15/20 0339 05/16/20 0352 05/17/20 0257 05/18/20 0341 05/19/20 0348 05/20/20 0230  NA  --  138   < > 138 136 137 138 135  K  --  3.9   < > 4.0 4.2 4.5 4.5 4.6  CL  --  105   < > 105 102 104 103 99  CO2  --  25   < >  28 27 27 29 28   GLUCOSE  --  245*   < > 139* 229* 270* 223* 219*  BUN  --  14   < > 11 18 20  25* 24*  CREATININE  --  0.85   < > 0.75 0.83 0.88 0.84 0.85  CALCIUM  --  7.8*   < > 7.9* 8.0* 8.2* 8.5* 8.5*  MG 1.7 1.5*  --  1.5* 1.7 1.9 1.8 1.7  PHOS 2.4* 1.8*  --  1.5* 2.7  --   --   --    < > = values in this interval not displayed.   GFR: Estimated Creatinine Clearance: 91 mL/min (by C-G formula based on SCr of 0.85 mg/dL). Liver Function Tests: Recent Labs  Lab 05/15/20 0339 05/20/20 0230  AST 28 19  ALT 34 22  ALKPHOS 201* 138*  BILITOT 0.7 0.2*  PROT 5.5* 5.8*  ALBUMIN 2.3* 2.4*   No results for input(s): LIPASE, AMYLASE in the last 168 hours. No results for input(s): AMMONIA in the last 168 hours. Coagulation Profile: No results for input(s): INR, PROTIME in the last 168 hours. Cardiac Enzymes: No results for input(s): CKTOTAL, CKMB, CKMBINDEX, TROPONINI in the last 168 hours. BNP (last 3 results) No results for input(s): PROBNP in the last 8760 hours. HbA1C: No results for input(s): HGBA1C in the last 72 hours. CBG: Recent Labs  Lab 05/19/20 1608 05/19/20 1950 05/19/20 2336 05/20/20 0322 05/20/20 0757  GLUCAP 200* 213* 246* 208* 148*   Lipid Profile: No results for input(s): CHOL, HDL, LDLCALC, TRIG, CHOLHDL, LDLDIRECT in the  last 72 hours. Thyroid Function Tests: No results for input(s): TSH, T4TOTAL, FREET4, T3FREE, THYROIDAB in the last 72 hours. Anemia Panel: No results for input(s): VITAMINB12, FOLATE, FERRITIN, TIBC, IRON, RETICCTPCT in the last 72 hours. Sepsis Labs: Recent Labs  Lab 05/14/20 1229  LATICACIDVEN 2.3*    Recent Results (from the past 240 hour(s))  Culture, blood (Routine x 2)     Status: Abnormal   Collection Time: 05/10/20  7:20 PM   Specimen: BLOOD  Result Value Ref Range Status   Specimen Description BLOOD SITE NOT SPECIFIED  Final   Special Requests   Final    BOTTLES DRAWN AEROBIC AND ANAEROBIC Blood Culture results may not be optimal due to an inadequate volume of blood received in culture bottles   Culture  Setup Time (A)  Final    GRAM VARIABLE ROD IN BOTH AEROBIC AND ANAEROBIC BOTTLES CRITICAL RESULT CALLED TO, READ BACK BY AND VERIFIED WITH: PHARMD JESSICA M. 1207 05/12/20 FCP Performed at [facility] Lab, [address], [place], [address] [place]    Culture PROTEUS MIRABILIS (A)  Final   Report Status 05/13/2020 FINAL  Final   Organism ID, Bacteria PROTEUS MIRABILIS  Final      Susceptibility   Proteus mirabilis - MIC*    AMPICILLIN <=2 SENSITIVE Sensitive     CEFAZOLIN 8 SENSITIVE Sensitive     CEFEPIME <=0.12 SENSITIVE Sensitive     CEFTAZIDIME <=1 SENSITIVE Sensitive     CEFTRIAXONE <=0.25 SENSITIVE Sensitive     CIPROFLOXACIN >=4 RESISTANT Resistant     GENTAMICIN <=1 SENSITIVE Sensitive     IMIPENEM 2 SENSITIVE Sensitive     TRIMETH/SULFA <=20 SENSITIVE Sensitive     AMPICILLIN/SULBACTAM <=2 SENSITIVE Sensitive     PIP/TAZO <=4 SENSITIVE Sensitive     * PROTEUS MIRABILIS  Blood Culture ID Panel (Reflexed)     Status: Abnormal  Collection Time: 05/10/20  7:20 PM  Result Value Ref Range Status   Enterococcus faecalis NOT DETECTED NOT DETECTED Final   Enterococcus Faecium NOT DETECTED NOT DETECTED Final   Listeria monocytogenes NOT DETECTED NOT DETECTED  Final   Staphylococcus species NOT DETECTED NOT DETECTED Final   Staphylococcus aureus (BCID) NOT DETECTED NOT DETECTED Final   Staphylococcus epidermidis NOT DETECTED NOT DETECTED Final   Staphylococcus lugdunensis NOT DETECTED NOT DETECTED Final   Streptococcus species NOT DETECTED NOT DETECTED Final   Streptococcus agalactiae NOT DETECTED NOT DETECTED Final   Streptococcus pneumoniae NOT DETECTED NOT DETECTED Final   Streptococcus pyogenes NOT DETECTED NOT DETECTED Final   A.calcoaceticus-baumannii NOT DETECTED NOT DETECTED Final   Bacteroides fragilis NOT DETECTED NOT DETECTED Final   Enterobacterales DETECTED (A) NOT DETECTED Final    Comment: Enterobacterales represent a large order of gram negative bacteria, not a single organism. CRITICAL RESULT CALLED TO, READ BACK BY AND VERIFIED WITH: PHARMD JESSICA M. [phone] FCP    Enterobacter cloacae complex NOT DETECTED NOT DETECTED Final   Escherichia coli NOT DETECTED NOT DETECTED Final   Klebsiella aerogenes NOT DETECTED NOT DETECTED Final   Klebsiella oxytoca NOT DETECTED NOT DETECTED Final   Klebsiella pneumoniae NOT DETECTED NOT DETECTED Final   Proteus species DETECTED (A) NOT DETECTED Final    Comment: CRITICAL RESULT CALLED TO, READ BACK BY AND VERIFIED WITH: PHARMD JESSICA M. [phone] FCP    Salmonella species NOT DETECTED NOT DETECTED Final   Serratia marcescens NOT DETECTED NOT DETECTED Final   Haemophilus influenzae NOT DETECTED NOT DETECTED Final   Neisseria meningitidis NOT DETECTED NOT DETECTED Final   Pseudomonas aeruginosa NOT DETECTED NOT DETECTED Final   Stenotrophomonas maltophilia NOT DETECTED NOT DETECTED Final   Candida albicans NOT DETECTED NOT DETECTED Final   Candida auris NOT DETECTED NOT DETECTED Final   Candida glabrata NOT DETECTED NOT DETECTED Final   Candida krusei NOT DETECTED NOT DETECTED Final   Candida parapsilosis NOT DETECTED NOT DETECTED Final   Candida tropicalis NOT DETECTED NOT  DETECTED Final   Cryptococcus neoformans/gattii NOT DETECTED NOT DETECTED Final   CTX-M ESBL NOT DETECTED NOT DETECTED Final   Carbapenem resistance IMP NOT DETECTED NOT DETECTED Final   Carbapenem resistance KPC NOT DETECTED NOT DETECTED Final   Carbapenem resistance NDM NOT DETECTED NOT DETECTED Final   Carbapenem resist OXA 48 LIKE NOT DETECTED NOT DETECTED Final   Carbapenem resistance VIM NOT DETECTED NOT DETECTED Final    Comment: Performed at [facility] Lab, [address] [place], [place] [address]  Resp Panel by RT-PCR (Flu A&B, Covid) Nasopharyngeal Swab     Status: None   Collection Time: 05/10/20  7:45 PM   Specimen: Nasopharyngeal Swab; Nasopharyngeal(NP) swabs in vial transport medium  Result Value Ref Range Status   SARS Coronavirus 2 by RT PCR NEGATIVE NEGATIVE Final    Comment: (NOTE) SARS-CoV-2 target nucleic acids are NOT DETECTED.  The SARS-CoV-2 RNA is generally detectable in upper respiratory specimens during the acute phase of infection. The lowest concentration of SARS-CoV-2 viral copies this assay can detect is 138 copies/mL. A negative result does not preclude SARS-Cov-2 infection and should not be used as the sole basis for treatment or other patient management decisions. A negative result may occur with  improper specimen collection/handling, submission of specimen other than nasopharyngeal swab, presence of viral mutation(s) within the areas targeted by this assay, and inadequate number of viral copies(<138 copies/mL). A negative  result must be combined with clinical observations, patient history, and epidemiological information. The expected result is Negative.  Fact Sheet for Patients:  BloggerCourse.com  Fact Sheet for Healthcare Providers:  SeriousBroker.it  This test is no t yet approved or cleared by the [place] FDA and  has been authorized for detection and/or diagnosis of SARS-CoV-2  by FDA under an Emergency Use Authorization (EUA). This EUA will remain  in effect (meaning this test can be used) for the duration of the COVID-19 declaration under Section 564(b)(1) of the Act, 21 U.S.C.section 360bbb-3(b)(1), unless the authorization is terminated  or revoked sooner.       Influenza A by PCR NEGATIVE NEGATIVE Final   Influenza B by PCR NEGATIVE NEGATIVE Final    Comment: (NOTE) The Xpert Xpress SARS-CoV-2/FLU/RSV plus assay is intended as an aid in the diagnosis of influenza from Nasopharyngeal swab specimens and should not be used as a sole basis for treatment. Nasal washings and aspirates are unacceptable for Xpert Xpress SARS-CoV-2/FLU/RSV testing.  Fact Sheet for Patients: BloggerCourse.com  Fact Sheet for Healthcare Providers: SeriousBroker.it  This test is not yet approved or cleared by the [place] FDA and has been authorized for detection and/or diagnosis of SARS-CoV-2 by FDA under an Emergency Use Authorization (EUA). This EUA will remain in effect (meaning this test can be used) for the duration of the COVID-19 declaration under Section 564(b)(1) of the Act, 21 U.S.C. section 360bbb-3(b)(1), unless the authorization is terminated or revoked.  Performed at [facility] Lab, [address], [place], [place] [address]   Culture, blood (Routine x 2)     Status: Abnormal   Collection Time: 05/10/20  9:06 PM   Specimen: BLOOD  Result Value Ref Range Status   Specimen Description BLOOD SITE NOT SPECIFIED  Final   Special Requests   Final    BOTTLES DRAWN AEROBIC AND ANAEROBIC Blood Culture results may not be optimal due to an inadequate volume of blood received in culture bottles   Culture  Setup Time (A)  Final    GRAM VARIABLE ROD IN BOTH AEROBIC AND ANAEROBIC BOTTLES CRITICAL VALUE NOTED.  VALUE IS CONSISTENT WITH PREVIOUSLY REPORTED AND CALLED VALUE.    Culture (A)  Final    PROTEUS  MIRABILIS SUSCEPTIBILITIES PERFORMED ON PREVIOUS CULTURE WITHIN THE LAST 5 DAYS. Performed at [facility] Lab, [address], [place], [place] [address]    Report Status 05/13/2020 FINAL  Final  Urine Culture     Status: Abnormal   Collection Time: 05/11/20  2:07 AM   Specimen: Urine, Cystoscope  Result Value Ref Range Status   Specimen Description CYSTOSCOPY URINE, CATHETERIZED  Final   Special Requests   Final    PATIENT ON FOLLOWING MAXAPIME Performed at [facility], [address] [place], [place] [address]    Culture >=100,000 COLONIES/mL PROTEUS MIRABILIS (A)  Final   Report Status 05/13/2020 FINAL  Final   Organism ID, Bacteria PROTEUS MIRABILIS (A)  Final      Susceptibility   Proteus mirabilis - MIC*    AMPICILLIN <=2 SENSITIVE Sensitive     CEFAZOLIN 8 SENSITIVE Sensitive     CEFEPIME <=0.12 SENSITIVE Sensitive     CEFTAZIDIME <=1 SENSITIVE Sensitive     CEFTRIAXONE <=0.25 SENSITIVE Sensitive     CIPROFLOXACIN >=4 RESISTANT Resistant     GENTAMICIN <=1 SENSITIVE Sensitive     IMIPENEM 2 SENSITIVE Sensitive     TRIMETH/SULFA <=20 SENSITIVE Sensitive  AMPICILLIN/SULBACTAM <=2 SENSITIVE Sensitive     PIP/TAZO <=4 SENSITIVE Sensitive     * >=100,000 COLONIES/mL PROTEUS MIRABILIS  MRSA PCR Screening     Status: None   Collection Time: 05/11/20  5:37 AM   Specimen: Nasal Mucosa; Nasopharyngeal  Result Value Ref Range Status   MRSA by PCR NEGATIVE NEGATIVE Final    Comment:        The GeneXpert MRSA Assay (FDA approved for NASAL specimens only), is one component of a comprehensive MRSA colonization surveillance program. It is not intended to diagnose MRSA infection nor to guide or monitor treatment for MRSA infections. Performed at [facility], [address] [place], [place] [address]   Culture, blood (routine x 2)     Status: None   Collection Time: 05/12/20 10:50 AM   Specimen: BLOOD  Result Value Ref Range Status   Specimen Description  BLOOD LEFT ANTECUBITAL  Final   Special Requests   Final    BOTTLES DRAWN AEROBIC AND ANAEROBIC Blood Culture adequate volume   Culture   Final    NO GROWTH 5 DAYS Performed at [facility], [address] [place], [place] [address]    Report Status 05/17/2020 FINAL  Final  Culture, blood (routine x 2)     Status: None   Collection Time: 05/12/20 10:55 AM   Specimen: BLOOD  Result Value Ref Range Status   Specimen Description BLOOD LEFT ANTECUBITAL  Final   Special Requests   Final    BOTTLES DRAWN AEROBIC AND ANAEROBIC Blood Culture adequate volume   Culture   Final    NO GROWTH 5 DAYS Performed at [facility]  Lab, [address] [place], [place] [address]    Report Status 05/17/2020 FINAL  Final         Radiology Studies: No results found.      Scheduled Meds: . amoxicillin  500 mg Oral Q8H  . atorvastatin  40 mg Per Tube Daily  . Chlorhexidine Gluconate Cloth  6 each Topical Daily  . insulin aspart  0-20 Units Subcutaneous Q4H  . insulin aspart  5 Units Subcutaneous Q4H  . insulin glargine  26 Units Subcutaneous QHS  . levETIRAcetam  500 mg Oral BID  . metoprolol tartrate  12.5 mg Per Tube BID  . sodium chloride flush  10-40 mL Intracatheter Q12H   Continuous Infusions: . feeding supplement (GLUCERNA 1.5 CAL) 55 mL/hr at 05/19/20 1804          Glade LloydKshitiz Kaisei Gilbo, MD [facility] 05/20/2020, 8:05 AM

## 2020-05-20 NOTE — Progress Notes (Signed)
Nutrition Follow-up  DOCUMENTATION CODES:   Obesity unspecified  INTERVENTION:   Nocturnal tube feeds via Cortrak: Glucerna 1.5 at 50 ml/h x 12 hours 6pm-6am (600 ml per day)  Provides 900 kcal, 50 grams of protein, and 455 ml of free water daily. This meets ~50% of her estimated protein and kcal needs.  Add Ensure Enlive po TID, each supplement provides 350 kcal and 20 grams of protein  NUTRITION DIAGNOSIS:   Inadequate oral intake related to dysphagia,decreased appetite as evidenced by meal completion < 50%.  Ongoing   GOAL:   Patient will meet greater than or equal to 90% of their needs   Met with TF  MONITOR:   PO intake,Supplement acceptance,Labs,TF tolerance  REASON FOR ASSESSMENT:   Consult Enteral/tube feeding initiation and management   ASSESSMENT:   55 year old female who presented to the ED on 4/26 with AMS. PMH of CHF, CKD stage 4a, COPD, CVA, HTN, T2DM, nonverbal at baseline. Pt admitted with sepsis 2/2 hydronephrosis with renal and ureteral calculus obstruction.   Patient had some coughing and choking with water and ice chips 2 nights ago. S/P SLP re- evaluation yesterday, diet advanced to dysphagia 3 with thin liquids. Per discussion with RN, patient has been eating a little better, but has early satiety.  Meal intakes: 25-75% (average 56% for the past 7 meals documented).  Cortrak in place, tip is gastric. Currently receiving Glucerna 1.5 at 55 ml/h to provide 1980 kcal, 109 gm protein, 1002 ml free water daily. Will change to nocturnal TF regimen to help improve oral intake during the day.   Medications reviewed and include Novolog, Lantus, Keppra.  Labs reviewed.  CBG's: [phone]  Diet Order:   Diet Order            DIET DYS 3 Room service appropriate? Yes; Fluid consistency: Thin  Diet effective now                 EDUCATION NEEDS:   No education needs have been identified at this time  Skin:  Skin Assessment: Skin Integrity  Issues: Incisions: perineum  Last BM:  4/26 type 6  Height:   Ht Readings from Last 1 Encounters:  05/10/20 5' 4"  (1.626 m)    Weight:   Wt Readings from Last 1 Encounters:  05/19/20 110.8 kg    BMI:  Body mass index is 41.93 kg/m.  Estimated Nutritional Needs:   Kcal:  1850-2050  Protein:  90-110 grams  Fluid:  1.8-2.0 L   Lucas Mallow, RD, LDN, CNSC Please refer to Amion for contact information.

## 2020-05-20 NOTE — TOC Progression Note (Addendum)
Transition of Care [facility]) - Progression Note    Patient Details  Name: Lesli Issa MRN: [id number] Date of Birth: [birth year]  Transition of Care [facility]) CM/SW Contact  Erin Sons, [place] Phone Number: 05/20/2020, 2:36 PM  Clinical Narrative:     CSW called [name] with [facility] in [place] at [phone]; no answer and voicemailbox is full.   Expected Discharge Plan: Skilled Nursing Facility Barriers to Discharge: Continued Medical Work up  Expected Discharge Plan and Services Expected Discharge Plan: Skilled Nursing Facility       Living arrangements for the past 2 months: Skilled Nursing Facility                                       Social Determinants of Health (SDOH) Interventions    Readmission Risk Interventions No flowsheet data found.

## 2020-05-20 NOTE — Progress Notes (Signed)
I contacted Rocky Morel RN/Dietician and Glade Lloyd, MD about the pt's cont Tube feed and her PO intake of food and liquids. Pt is eating and drinking but gets full quickly d/t the cont tube feed. I reached out about tapering off of the tube feed to see if the pt would be able to tol more oral intake on her own. The pt has not been coughing or choking on any food or liquids I have given her in the last two day shifts.  Colin Mulders Rn is changing pt's tube feeds to nocturnal 6p-6a and adding ensure. Will cont to assist pt with meals and monitor pt.

## 2020-05-21 ENCOUNTER — Encounter (HOSPITAL_COMMUNITY): Payer: Self-pay | Admitting: Urology

## 2020-05-21 DIAGNOSIS — N201 Calculus of ureter: Secondary | ICD-10-CM | POA: Diagnosis not present

## 2020-05-21 DIAGNOSIS — N179 Acute kidney failure, unspecified: Secondary | ICD-10-CM | POA: Diagnosis not present

## 2020-05-21 DIAGNOSIS — A419 Sepsis, unspecified organism: Secondary | ICD-10-CM | POA: Diagnosis not present

## 2020-05-21 DIAGNOSIS — N132 Hydronephrosis with renal and ureteral calculous obstruction: Secondary | ICD-10-CM | POA: Diagnosis not present

## 2020-05-21 LAB — CBC WITH DIFFERENTIAL/PLATELET
Abs Immature Granulocytes: 0.17 10*3/uL — ABNORMAL HIGH (ref 0.00–0.07)
Basophils Absolute: 0 10*3/uL (ref 0.0–0.1)
Basophils Relative: 0 %
Eosinophils Absolute: 0.4 10*3/uL (ref 0.0–0.5)
Eosinophils Relative: 3 %
HCT: 32.8 % — ABNORMAL LOW (ref 36.0–46.0)
Hemoglobin: 10.5 g/dL — ABNORMAL LOW (ref 12.0–15.0)
Immature Granulocytes: 1 %
Lymphocytes Relative: 23 %
Lymphs Abs: 3.2 10*3/uL (ref 0.7–4.0)
MCH: 31 pg (ref 26.0–34.0)
MCHC: 32 g/dL (ref 30.0–36.0)
MCV: 96.8 fL (ref 80.0–100.0)
Monocytes Absolute: 0.9 10*3/uL (ref 0.1–1.0)
Monocytes Relative: 7 %
Neutro Abs: 9.3 10*3/uL — ABNORMAL HIGH (ref 1.7–7.7)
Neutrophils Relative %: 66 %
Platelets: 316 10*3/uL (ref 150–400)
RBC: 3.39 MIL/uL — ABNORMAL LOW (ref 3.87–5.11)
RDW: 13.7 % (ref 11.5–15.5)
WBC: 14.1 10*3/uL — ABNORMAL HIGH (ref 4.0–10.5)
nRBC: 0.4 % — ABNORMAL HIGH (ref 0.0–0.2)

## 2020-05-21 LAB — COMPREHENSIVE METABOLIC PANEL
ALT: 22 U/L (ref 0–44)
AST: 18 U/L (ref 15–41)
Albumin: 2.6 g/dL — ABNORMAL LOW (ref 3.5–5.0)
Alkaline Phosphatase: 126 U/L (ref 38–126)
Anion gap: 8 (ref 5–15)
BUN: 22 mg/dL — ABNORMAL HIGH (ref 6–20)
CO2: 29 mmol/L (ref 22–32)
Calcium: 8.6 mg/dL — ABNORMAL LOW (ref 8.9–10.3)
Chloride: 98 mmol/L (ref 98–111)
Creatinine, Ser: 0.85 mg/dL (ref 0.44–1.00)
GFR, Estimated: >60 mL/min (ref >60)
Glucose, Bld: 175 mg/dL — ABNORMAL HIGH (ref 70–99)
Potassium: 4.7 mmol/L (ref 3.5–5.1)
Sodium: 135 mmol/L (ref 135–145)
Total Bilirubin: 0.2 mg/dL — ABNORMAL LOW (ref 0.3–1.2)
Total Protein: 5.8 g/dL — ABNORMAL LOW (ref 6.5–8.1)

## 2020-05-21 LAB — GLUCOSE, CAPILLARY
Glucose-Capillary: 128 mg/dL — ABNORMAL HIGH (ref 70–99)
Glucose-Capillary: 176 mg/dL — ABNORMAL HIGH (ref 70–99)
Glucose-Capillary: 208 mg/dL — ABNORMAL HIGH (ref 70–99)
Glucose-Capillary: 222 mg/dL — ABNORMAL HIGH (ref 70–99)
Glucose-Capillary: 227 mg/dL — ABNORMAL HIGH (ref 70–99)
Glucose-Capillary: 234 mg/dL — ABNORMAL HIGH (ref 70–99)

## 2020-05-21 LAB — SARS CORONAVIRUS 2 (TAT 6-24 HRS): SARS Coronavirus 2: NEGATIVE

## 2020-05-21 LAB — MAGNESIUM: Magnesium: 1.8 mg/dL (ref 1.7–2.4)

## 2020-05-21 MED ORDER — ASPIRIN EC 325 MG PO TBEC
325.0000 mg | DELAYED_RELEASE_TABLET | Freq: Every day | ORAL | Status: DC
  Administered 2020-05-21 – 2020-05-22 (×2): 325 mg via ORAL
  Filled 2020-05-21 (×2): qty 1

## 2020-05-21 MED ORDER — LIP MEDEX EX OINT
TOPICAL_OINTMENT | CUTANEOUS | Status: DC | PRN
  Filled 2020-05-21: qty 7

## 2020-05-21 MED ORDER — AMOXICILLIN 250 MG/5ML PO SUSR
500.0000 mg | Freq: Three times a day (TID) | ORAL | Status: DC
  Administered 2020-05-21 – 2020-05-22 (×3): 500 mg
  Filled 2020-05-21 (×4): qty 10

## 2020-05-21 MED ORDER — INSULIN ASPART 100 UNIT/ML ~~LOC~~ SOLN
0.0000 [IU] | Freq: Three times a day (TID) | SUBCUTANEOUS | Status: DC
  Administered 2020-05-22: 15 [IU] via SUBCUTANEOUS

## 2020-05-21 MED ORDER — ENOXAPARIN SODIUM 40 MG/0.4ML ~~LOC~~ SOLN
40.0000 mg | SUBCUTANEOUS | Status: DC
  Administered 2020-05-21: 40 mg via SUBCUTANEOUS
  Filled 2020-05-21: qty 0.4

## 2020-05-21 MED ORDER — ARIPIPRAZOLE 2 MG PO TABS
2.0000 mg | ORAL_TABLET | Freq: Every day | ORAL | Status: DC
  Administered 2020-05-21: 2 mg via ORAL
  Filled 2020-05-21: qty 1

## 2020-05-21 MED ORDER — FLUOXETINE HCL 20 MG PO CAPS
60.0000 mg | ORAL_CAPSULE | Freq: Every day | ORAL | Status: DC
  Administered 2020-05-21: 60 mg via ORAL
  Filled 2020-05-21: qty 3

## 2020-05-21 MED ORDER — LEVETIRACETAM 100 MG/ML PO SOLN
500.0000 mg | Freq: Two times a day (BID) | ORAL | Status: DC
  Administered 2020-05-21 – 2020-05-22 (×2): 500 mg
  Filled 2020-05-21 (×2): qty 5

## 2020-05-21 NOTE — Progress Notes (Signed)
TRH night shift.  CBG monitoring with RI SS was started earlier.  Sanda Klein, MD.

## 2020-05-21 NOTE — Plan of Care (Signed)

## 2020-05-21 NOTE — Progress Notes (Addendum)
COVID Vaccine Completed: Yes Date COVID Vaccine completed: x2 Has received booster: N/A COVID vaccine manufacturer:    Moderna    Date of COVID positive in last 90 days: no  PCP - Karna Dupes, MD Cardiologist - N/A  Chest x-ray - 05/10/2020 in epic EKG - N/A Stress Test -  ECHO - 05/13/2020 in epic Cardiac Cath - N/A Pacemaker/ICD device last checked: N/A  Sleep Study - N/A CPAP - N/A  Fasting Blood Sugar - 90-150's Checks Blood Sugar __twice___ times a day  Blood Thinner Instructions: N/A Aspirin Instructions: Yes Last Dose: 05/28/2020  Activity level:  Uses Hoyer lift no activity     Anesthesia review: HTN, CVA right side weakness, CHF, HTN, DMII  Patient denies shortness of breath, fever, cough and chest pain at PAT appointment   Patient verbalized understanding of instructions that were given to them at the PAT appointment. Patient was also instructed that they will need to review over the PAT instructions again at home before surgery.

## 2020-05-21 NOTE — Progress Notes (Signed)
Nutrition Follow-up  DOCUMENTATION CODES:   Obesity unspecified  INTERVENTION:   D/C tube feeding.  Continue Ensure Enlive po TID, each supplement provides 350 kcal and 20 grams of protein  NUTRITION DIAGNOSIS:   Inadequate oral intake related to dysphagia,decreased appetite as evidenced by meal completion < 50%.  Ongoing   GOAL:   Patient will meet greater than or equal to 90% of their needs   Progressing   MONITOR:   PO intake,Supplement acceptance  REASON FOR ASSESSMENT:   Consult Enteral/tube feeding initiation and management   ASSESSMENT:   55 year old female who presented to the ED on 4/26 with AMS. PMH of CHF, CKD stage 4a, COPD, CVA, HTN, T2DM, nonverbal at baseline. Pt admitted with sepsis 2/2 hydronephrosis with renal and ureteral calculus obstruction.   Patient has tolerated PO diet well since yesterday. She is on a dysphagia 3 diet with thin liquids. RN reports patient is trying to finish her Ensure supplements as well as eating her meals. She ate 75% of breakfast this morning.   Plans to d/c TF and remove Cortrak tube today.   Medications reviewed and include Novolog, Lantus, Keppra.  Labs reviewed.  CBG's: [phone]  Diet Order:   Diet Order            DIET DYS 3 Room service appropriate? Yes; Fluid consistency: Thin  Diet effective now                 EDUCATION NEEDS:   No education needs have been identified at this time  Skin:  Skin Assessment: Skin Integrity Issues: Incisions: perineum  Last BM:  4/27  Height:   Ht Readings from Last 1 Encounters:  05/10/20 5\' 4"  (1.626 m)    Weight:   Wt Readings from Last 1 Encounters:  05/19/20 110.8 kg    BMI:  Body mass index is 41.93 kg/m.  Estimated Nutritional Needs:   Kcal:  1850-2050  Protein:  90-110 grams  Fluid:  1.8-2.0 L   05/21/20, RD, LDN, CNSC Please refer to Amion for contact information.

## 2020-05-21 NOTE — Progress Notes (Addendum)
PROGRESS NOTE        PATIENT DETAILS Name: Julie Donovan Age: 55 y.o. Sex: female Date of Birth: 1965/07/20 Admit Date: 05/10/2020 Admitting Physician Annett Fabian, MD ZOX:[id number], Denny Levy, MD  Brief Narrative: Patient is a 55 y.o. female with prior history of CVA-chronic right hemiparesis/aphasia, dementia, HTN, DM-2, chronic diastolic heart failure-presented with acute metabolic encephalopathy-further work-up revealed Proteus bacteremia in the setting of complicated UTI due to obstructive uropathy from bilateral ureteral stones.  Patient was seen by urology-underwent bilateral ureteral stent placement-following which patient became hypotensive-and required admission to the ICU for pressor support.  Upon further stability-she was transferred to the [facility] service.  Unfortunately hospital course was complicated by severe debility-worsening of her baseline dysphagia requiring NG tube feeding.  See below for further details.  Significant events: 4/17>> presented with AMS-found to have obstructive nephropathy with bilateral hydronephrosis-to OR for stent placement-subsequently became hypotensive.  To ICU for pressor support. 4/19>> transfer to [facility] 4/27>> NG tube removed  Significant studies: 4/16>> CT head: No acute abnormalities 4/16>> CT chest: No pneumonia, moderate pericardial effusion 4/16>> CT abdomen/pelvis: Bilateral nephrolithiasis, right hydronephrosis due to 8 mm proximal right ureteral stone, left pelvicaliectasis due to 2 mm left UVJ stone 4/19>> Echo: EF 60-65%, moderate pericardial effusion-no tamponade. 4/20>> MRI brain: No acute infarct 4/20>> EEG: No seizures. 4/23>> bilateral lower extremity Doppler: No DVT.  Antimicrobial therapy: Vancomycin: 4/16 x 1 Flagyl: 4/16 x 1 Cefoxitin: 4/16 x 1 Cefepime: 4/17 x 1 Rocephin: 4/18>> 4/19 Ampicillin: 4/20>> 4/21 Amoxicillin: 4/21>>  Microbiology data: 4/16>> blood culture: Proteus Mirabilis 4/16  >>urine culture:Proteus Mirabilis 4/18>> blood culture: No growth  Procedures : 4/17>> cystoscopy with bilateral retrograde pyelogram-bilateral ureteral stent placement-Foley catheter placement 4/17>> left IJ CVC  Consults: PCCM, ID, urology, palliative care  DVT Prophylaxis : Place and maintain sequential compression device Start: 05/12/20 1117 SCDs Start: 05/11/20 0713   Subjective: Lying comfortably in bed-able to speak in 1-2 word sentences.   Assessment/Plan: Septic shock due to Proteus bacteremia in the setting of complicated UTI and obstructive uropathy: Sepsis physiology has resolved-patient is s/p bilateral ureteral stent placements.  Urology/ID following.  Urology planning on bilateral ureteroscopy and laser lithotripsy on 06/03/2020.  ID recommends to continue amoxicillin to get her through surgery on 5/10 and 3 days beyond that.   Will ask RN to see if we can discontinue left IJ TLC and place peripheral access.  Acute metabolic encephalopathy: Due to sepsis-resolved-back to her baseline.  Neuroimaging/EEG as above.  AKI : AKI hemodynamically mediated-has resolved-creatinine back to baseline.  Pericardial effusion: Moderate-by echo-no tamponade by echo.  Suspect needs repeat echo in a few weeks.  Acute on chronic oropharyngeal dysphagia: Probably has chronic dysphagia due to prior CVA-worsened due to severe debility/deconditioning due to acute illness.  Required NG tube feeding-rapidly improving-tolerated dysphagia 3 diet-NG tube discontinued today.  Watch closely.  Normocytic anemia: Due to acute illness-no evidence of blood loss.  Follow.  Leukocytosis: Gradually improving-on antibiotics as above.  HTN: BP stable-continue metoprolol  HLD: Continue statin  DM-2: CBGs controlled-continue Lantus 26 units daily-and SSI.  NG tube discontinued today-watch closely-May need need changes to her insulin regimen.  Recent Labs    05/21/20 0411 05/21/20 0754 05/21/20 1222   GLUCAP 176* 128* 227*   Seizure disorder: Continue Keppra.  EEG negative for seizures.  Prior history of CVA  with resultant aphasia/right-sided hemiparesis: Resume aspirin.  History of dementia-depression: Resume Abilify-Prozac.  Morbid Obesity: Estimated body mass index is 41.93 kg/m as calculated from the following:   Height as of this encounter: 5\' 4"  (1.626 m).   Weight as of this encounter: 110.8 kg.     Diet: Diet Order            DIET DYS 3 Room service appropriate? Yes; Fluid consistency: Thin  Diet effective now                  Code Status: Full code   Family Communication: Sister-[phone]-over the phone 4/27  Disposition Plan: Status is: Inpatient  Remains inpatient appropriate because:Inpatient level of care appropriate due to severity of illness   Dispo: The patient is from: SNF              Anticipated d/c is to: SNF              Patient currently is not medically stable to d/c.   Difficult to place patient No    Barriers to Discharge: Resolving dysphagia-observe overnight-if stable-discharge back to SNF on 4/28.  Antimicrobial agents: Anti-infectives (From admission, onward)   Start     Dose/Rate Route Frequency Ordered Stop   05/21/20 1400  amoxicillin (AMOXIL) 250 MG/5ML suspension 500 mg        500 mg Per Tube Every 8 hours 05/21/20 1036 06/06/20 2359   05/15/20 1400  amoxicillin (AMOXIL) 250 MG/5ML suspension 500 mg  Status:  Discontinued        500 mg Oral Every 8 hours 05/15/20 1054 05/21/20 1036   05/14/20 1200  ampicillin (OMNIPEN) 2 g in sodium chloride 0.9 % 100 mL IVPB  Status:  Discontinued        2 g 300 mL/hr over 20 Minutes Intravenous Every 4 hours 05/13/20 1403 05/15/20 1054   05/12/20 1500  cefTRIAXone (ROCEPHIN) 2 g in sodium chloride 0.9 % 100 mL IVPB  Status:  Discontinued        2 g 200 mL/hr over 30 Minutes Intravenous Every 24 hours 05/12/20 1042 05/13/20 1403   05/11/20 2100  ceFEPIme (MAXIPIME) 2 g in  sodium chloride 0.9 % 100 mL IVPB  Status:  Discontinued        2 g 200 mL/hr over 30 Minutes Intravenous Every 24 hours 05/10/20 2046 05/11/20 1425   05/11/20 1515  ceFEPIme (MAXIPIME) 2 g in sodium chloride 0.9 % 100 mL IVPB  Status:  Discontinued        2 g 200 mL/hr over 30 Minutes Intravenous Every 12 hours 05/11/20 1425 05/12/20 1042   05/11/20 0200  cefOXitin (MEFOXIN) 2 g in sodium chloride 0.9 % 100 mL IVPB        2 g 200 mL/hr over 30 Minutes Intravenous  Once 05/11/20 0150 05/11/20 0232   05/10/20 2100  vancomycin (VANCOREADY) IVPB 2000 mg/400 mL        2,000 mg 200 mL/hr over 120 Minutes Intravenous  Once 05/10/20 2046 05/11/20 0011   05/10/20 2045  vancomycin variable dose per unstable renal function (pharmacist dosing)  Status:  Discontinued         Does not apply See admin instructions 05/10/20 2046 05/12/20 1042   05/10/20 1930  ceFEPIme (MAXIPIME) 2 g in sodium chloride 0.9 % 100 mL IVPB  Status:  Discontinued        2 g 200 mL/hr over 30 Minutes Intravenous  Once 05/10/20 1921 05/12/20 1042  05/10/20 1930  metroNIDAZOLE (FLAGYL) IVPB 500 mg        500 mg 100 mL/hr over 60 Minutes Intravenous  Once 05/10/20 1921 05/10/20 2111   05/10/20 1930  vancomycin (VANCOREADY) IVPB 1000 mg/200 mL  Status:  Discontinued        1,000 mg 200 mL/hr over 60 Minutes Intravenous  Once 05/10/20 1921 05/10/20 2046       Time spent: 25- minutes-Greater than 50% of this time was spent in counseling, explanation of diagnosis, planning of further management, and coordination of care.  MEDICATIONS: Scheduled Meds: . amoxicillin  500 mg Per Tube Q8H  . atorvastatin  40 mg Per Tube Daily  . Chlorhexidine Gluconate Cloth  6 each Topical Daily  . feeding supplement  237 mL Oral TID BM  . insulin aspart  0-20 Units Subcutaneous Q4H  . insulin aspart  5 Units Subcutaneous Q4H  . insulin glargine  26 Units Subcutaneous QHS  . levETIRAcetam  500 mg Per Tube BID  . metoprolol tartrate  12.5  mg Per Tube BID  . sodium chloride flush  10-40 mL Intracatheter Q12H   Continuous Infusions: PRN Meds:.albuterol, hydrocortisone, lip balm, melatonin, morphine injection, sodium chloride flush   PHYSICAL EXAM: Vital signs: Vitals:   05/21/20 0000 05/21/20 0400 05/21/20 0751 05/21/20 1224  BP: 132/62 117/64 117/69 122/78  Pulse: 83 87    Resp: 15 20  20   Temp: 98.1 F (36.7 C) 98.1 F (36.7 C) 98.3 F (36.8 C) 98.8 F (37.1 C)  TempSrc: Axillary Oral Oral Axillary  SpO2: 97% 94%    Weight:      Height:       Filed Weights   05/17/20 0334 05/18/20 0300 05/19/20 0500  Weight: 109.8 kg 111.9 kg 110.8 kg   Body mass index is 41.93 kg/m.   Gen Exam:Alert awake-not in any distress HEENT:atraumatic, normocephalic Chest: B/L clear to auscultation anteriorly CVS:S1S2 regular Abdomen:soft non tender, non distended Extremities:no edema Neurology: Right-sided hemiparesis Skin: no rash  I have personally reviewed following labs and imaging studies  LABORATORY DATA: CBC: Recent Labs  Lab 05/17/20 0257 05/18/20 0341 05/19/20 0348 05/20/20 0230 05/21/20 0400  WBC 15.0* 15.1* 16.7* 14.1* 14.1*  NEUTROABS 9.4*  --  11.6* 9.6* 9.3*  HGB 10.7* 10.4* 10.3* 10.0* 10.5*  HCT 33.9* 33.3* 32.4* 32.0* 32.8*  MCV 96.0 96.2 95.6 96.7 96.8  PLT 167 215 250 249 316    Basic Metabolic Panel: Recent Labs  Lab 05/16/20 0352 05/17/20 0257 05/18/20 0341 05/19/20 0348 05/20/20 0230 05/21/20 0400  NA 138 136 137 138 135 135  K 4.0 4.2 4.5 4.5 4.6 4.7  CL 105 102 104 103 99 98  CO2 28 27 27 29 28 29   GLUCOSE 139* 229* 270* 223* 219* 175*  BUN 11 18 20  25* 24* 22*  CREATININE 0.75 0.83 0.88 0.84 0.85 0.85  CALCIUM 7.9* 8.0* 8.2* 8.5* 8.5* 8.6*  MG 1.5* 1.7 1.9 1.8 1.7 1.8  PHOS 1.5* 2.7  --   --   --   --     GFR: Estimated Creatinine Clearance: 91 mL/min (by C-G formula based on SCr of 0.85 mg/dL).  Liver Function Tests: Recent Labs  Lab 05/15/20 0339 05/20/20 0230  05/21/20 0400  AST 28 19 18   ALT 34 22 22  ALKPHOS 201* 138* 126  BILITOT 0.7 0.2* 0.2*  PROT 5.5* 5.8* 5.8*  ALBUMIN 2.3* 2.4* 2.6*   No results for input(s): LIPASE, AMYLASE in the last  168 hours. No results for input(s): AMMONIA in the last 168 hours.  Coagulation Profile: No results for input(s): INR, PROTIME in the last 168 hours.  Cardiac Enzymes: No results for input(s): CKTOTAL, CKMB, CKMBINDEX, TROPONINI in the last 168 hours.  BNP (last 3 results) No results for input(s): PROBNP in the last 8760 hours.  Lipid Profile: No results for input(s): CHOL, HDL, LDLCALC, TRIG, CHOLHDL, LDLDIRECT in the last 72 hours.  Thyroid Function Tests: No results for input(s): TSH, T4TOTAL, FREET4, T3FREE, THYROIDAB in the last 72 hours.  Anemia Panel: No results for input(s): VITAMINB12, FOLATE, FERRITIN, TIBC, IRON, RETICCTPCT in the last 72 hours.  Urine analysis:    Component Value Date/Time   COLORURINE YELLOW 05/10/2020 2341   APPEARANCEUR CLOUDY (A) 05/10/2020 2341   LABSPEC 1.020 05/10/2020 2341   PHURINE 7.5 05/10/2020 2341   GLUCOSEU NEGATIVE 05/10/2020 2341   HGBUR LARGE (A) 05/10/2020 2341   BILIRUBINUR NEGATIVE 05/10/2020 2341   KETONESUR NEGATIVE 05/10/2020 2341   PROTEINUR 100 (A) 05/10/2020 2341   NITRITE NEGATIVE 05/10/2020 2341   LEUKOCYTESUR SMALL (A) 05/10/2020 2341    Sepsis Labs: Lactic Acid, Venous    Component Value Date/Time   LATICACIDVEN 2.3 (HH) 05/14/2020 1229    MICROBIOLOGY: Recent Results (from the past 240 hour(s))  Culture, blood (routine x 2)     Status: None   Collection Time: 05/12/20 10:50 AM   Specimen: BLOOD  Result Value Ref Range Status   Specimen Description BLOOD LEFT ANTECUBITAL  Final   Special Requests   Final    BOTTLES DRAWN AEROBIC AND ANAEROBIC Blood Culture adequate volume   Culture   Final    NO GROWTH 5 DAYS Performed at [facility], [address] [place], [place] [address]    Report Status  05/17/2020 FINAL  Final  Culture, blood (routine x 2)     Status: None   Collection Time: 05/12/20 10:55 AM   Specimen: BLOOD  Result Value Ref Range Status   Specimen Description BLOOD LEFT ANTECUBITAL  Final   Special Requests   Final    BOTTLES DRAWN AEROBIC AND ANAEROBIC Blood Culture adequate volume   Culture   Final    NO GROWTH 5 DAYS Performed at [facility], [address] [place], [place] [address]    Report Status 05/17/2020 FINAL  Final    RADIOLOGY STUDIES/RESULTS: No results found.   LOS: 10 days   Jeoffrey MassedShanker Madalaine Portier, MD  [facility]    To contact the attending provider between 7A-7P or the covering provider during after hours 7P-7A, please log into the web site [email or web address] and access using universal [place] password for that web site. If you do not have the password, please call the hospital operator.  05/21/2020, 2:41 PM

## 2020-05-21 NOTE — Patient Instructions (Addendum)
Preop instructions for: Julie Donovan Date of Birth:  01/25/1966                     Date of Procedure:   06/03/2020 Procedure:  URETEROSCOPY/HOLMIUM LASER LITHOTRIPSY WITH STONE REMOVAL/URETERAL STENT EXCHANGE    Surgeon: Dr. Jerilee Field Facility contact:  [facility]   Phone:  [phone]               Health Care POA: N/A RN contact name/phone#:    Enid Skeens                      and Fax #: [phone]   Transportation contact phone#: [employer] through [facility]   Time to arrive at [facility]: 9:30 AM   Report to: Admitting (On your left hand side)    Do not eat or drink past midnight the night before your procedure.(To include any tube feedings-must be discontinued)   Take these morning medications only with sips of water.(or give through gastrostomy or feeding tube).  Carvedilol, Keppra  How to Manage Your Diabetes Before and After Surgery  Why is it important to control my blood sugar before and after surgery? . Improving blood sugar levels before and after surgery helps healing and can limit problems. . A way of improving blood sugar control is eating a healthy diet by: o  Eating less sugar and carbohydrates o  Increasing activity/exercise o  Talking with your doctor about reaching your blood sugar goals . High blood sugars (greater than 180 mg/dL) can raise your risk of infections and slow your recovery, so you will need to focus on controlling your diabetes during the weeks before surgery. . Make sure that the doctor who takes care of your diabetes knows about your planned surgery including the date and location.  How do I manage my blood sugar before surgery? . Check your blood sugar at least 4 times a day, starting 2 days before surgery, to make sure that the level is not too high or low. o Check your blood sugar the morning of your surgery when you wake up and every 2 hours until you get to the Short Stay unit. . If your blood sugar is  less than 70 mg/dL, you will need to treat for low blood sugar: o Do not take insulin. o Treat a low blood sugar (less than 70 mg/dL) with  cup of clear juice (cranberry or apple), 4 glucose tablets, OR glucose gel. o Recheck blood sugar in 15 minutes after treatment (to make sure it is greater than 70 mg/dL). If your blood sugar is not greater than 70 mg/dL on recheck, call [phone] for further instructions. . Report your blood sugar to the short stay nurse when you get to Short Stay.  . If you are admitted to the hospital after surgery: o Your blood sugar will be checked by the staff and you will probably be given insulin after surgery (instead of oral diabetes medicines) to make sure you have good blood sugar levels. o The goal for blood sugar control after surgery is 80-180 mg/dL.   WHAT DO I DO ABOUT MY DIABETES MEDICATION?  Marland Kitchen Do not take oral diabetes medicines (pills) the morning of surgery.  . THE NIGHT BEFORE SURGERY, take 1/2 (half)    units of  Lantus      Insulin.  (If Lantus is usually given at night!)     . If your CBG is  greater than 220 mg/dL, you may take  of your sliding scale        (correction) dose of insulin.  Reviewed and Endorsed by [facility] Patient Education Committee, August 2015   Please send day of procedure:current med list and meds last taken that day, confirm nothing by mouth status from what time, Patient Demographic info( to include DNR status, problem list, allergies)   Bring Insurance card and picture ID Leave all jewelry and other valuables at place where living( no metal or rings to be worn) No contact lens Women-no make-up, no lotions,perfumes,powders   Any questions day of procedure,call  SHORT [phone]     Sent from :[facility]                   Phone:[phone]                   Fax:[phone]   Sent by :  Rockwell Alexandria, BSN, RN

## 2020-05-21 NOTE — [place] FL2 (Signed)
[place] MEDICAID FL2 LEVEL OF CARE SCREENING TOOL     IDENTIFICATION  Patient Name: Julie Donovan Birthdate: 08-30-1965 Sex: female Admission Date (Current Location): 05/10/2020  [facility] and [place] Number:  Producer, television/film/video and Address:  The . [facility], [address], [place], [place] [address]      Provider Number: [id number]  Attending Physician Name and Address:  Maretta Bees, MD  Relative Name and Phone Number:  Steward Drone [facility]) [phone]    Current Level of Care: Hospital Recommended Level of Care: Skilled Nursing Facility Prior Approval Number:    Date Approved/Denied:   PASRR Number:    Discharge Plan: SNF    Current Diagnoses: Patient Active Problem List   Diagnosis Date Noted  . Sepsis (HCC) 05/11/2020  . Bilateral renal stones 05/11/2020  . Hydronephrosis with renal and ureteral calculus obstruction 05/11/2020  . Leukocytosis 05/11/2020  . Lactic acidosis 05/11/2020  . Hypoalbuminemia 05/11/2020  . Hyperglycemia 05/11/2020  . Acute kidney injury superimposed on CKD (HCC) 05/11/2020  . S/P cystoscopy with ureteral stent placement 05/11/2020  . Hydronephrosis with obstructing calculus 05/11/2020    Orientation RESPIRATION BLADDER Height & Weight     Self,Place  Normal Incontinent,Indwelling catheter Weight: 244 lb 4.3 oz (110.8 kg) Height:  5\' 4"  (162.6 cm)  BEHAVIORAL SYMPTOMS/MOOD NEUROLOGICAL BOWEL NUTRITION STATUS      Incontinent Diet (See d/c summary)  AMBULATORY STATUS COMMUNICATION OF NEEDS Skin   Extensive Assist Verbally Other (Comment) (Closed incision on perineum, ureteral drain)                       Personal Care Assistance Level of Assistance  Bathing,Feeding,Dressing Bathing Assistance: Maximum assistance Feeding assistance: Independent Dressing Assistance: Maximum assistance     Functional Limitations Info  Sight,Hearing,Speech Sight Info: Adequate Hearing Info: Adequate Speech  Info: Adequate    SPECIAL CARE FACTORS FREQUENCY  PT (By licensed PT),OT (By licensed OT)     PT Frequency: 5x/week OT Frequency: 5x/week            Contractures Contractures Info: Not present    Additional Factors Info  Code Status,Allergies,Insulin Sliding Scale Code Status Info: Full code Allergies Info: latex   Insulin Sliding Scale Info: see dc summary       Current Medications (05/21/2020):  This is the current hospital active medication list Current Facility-Administered Medications  Medication Dose Route Frequency Provider Last Rate Last Admin  . albuterol (PROVENTIL) (2.5 MG/3ML) 0.083% nebulizer solution 2.5 mg  2.5 mg Nebulization Q4H PRN 05/23/2020, MD      . amoxicillin (AMOXIL) 250 MG/5ML suspension 500 mg  500 mg Per Tube Q8H Ghimire, Baldwin Jamaica, MD   500 mg at 05/21/20 1228  . atorvastatin (LIPITOR) tablet 40 mg  40 mg Per Tube Daily 1229, Daiva Eves, MD   40 mg at 05/21/20 05/23/20  . Chlorhexidine Gluconate Cloth 2 % PADS 6 each  6 each Topical Daily 5621, MD   6 each at 05/21/20 [phone]  . feeding supplement (ENSURE ENLIVE / ENSURE PLUS) liquid 237 mL  237 mL Oral TID BM Alekh, Kshitiz, MD   237 mL at 05/21/20 1227  . hydrocortisone 1 % ointment   Topical TID PRN 05/23/20, MD      . insulin aspart (novoLOG) injection 0-20 Units  0-20 Units Subcutaneous Q4H 09-27-1980, MD   7 Units at 05/21/20 1226  . insulin aspart (novoLOG) injection 5 Units  5 Units Subcutaneous Q4H Albertine Grates, MD   5 Units at 05/21/20 1227  . insulin glargine (LANTUS) injection 26 Units  26 Units Subcutaneous QHS Pieter Partridge, MD   26 Units at 05/20/20 2116  . levETIRAcetam (KEPPRA) 100 MG/ML solution 500 mg  500 mg Per Tube BID Ghimire, Werner Lean, MD      . lip balm (CARMEX) ointment   Topical PRN Maretta Bees, MD      . melatonin tablet 3 mg  3 mg Per Tube QHS PRN Daiva Eves, Lisette Grinder, MD      . metoprolol tartrate (LOPRESSOR) tablet 12.5 mg  12.5 mg Per Tube  BID Daiva Eves, Lisette Grinder, MD   12.5 mg at 05/21/20 3875  . morphine 2 MG/ML injection 2 mg  2 mg Intravenous Q4H PRN Baldwin Jamaica, MD      . sodium chloride flush (NS) 0.9 % injection 10-40 mL  10-40 mL Intracatheter Q12H Martina Sinner, MD   10 mL at 05/20/20 2116  . sodium chloride flush (NS) 0.9 % injection 10-40 mL  10-40 mL Intracatheter PRN Martina Sinner, MD         Discharge Medications: Please see discharge summary for a list of discharge medications.  Relevant Imaging Results:  Relevant Lab Results:   Additional Information SSN: [id number]  Erin Sons, LCSW

## 2020-05-21 NOTE — Progress Notes (Signed)
  Speech Language Pathology Treatment: Dysphagia  Patient Details Name: Julie Donovan MRN: [id number] DOB: [birth year] Today's Date: 05/21/2020 Time: 1211-1229 SLP Time Calculation (min) (ACUTE ONLY): 18.6 min  Assessment / Plan / Recommendation Clinical Impression  Pt was seen for dysphagia treatment. She was alert and cooperative throughout the session, but verbal output was limited. Pt's NT and RN denied any signs of aspiration with p.o. intake. Pt's NT stated that the pt's meats have been hard and that the pt has demonstrated some difficulty masticating harder foods. Mastication was mildly prolonged and oral clearance initially adequate. Pocketing in the right lateral sulcus was noted after multiple trials, but this was cleared with a liquid wash. No s/sx of aspiration were noted with any solids or liquids. Pt's RN reported that the team is considering Cortrak removal and pt's RN was advised that it is no longed necessary from an oropharyngeal swallowing standpoint. SLP will continue to follow pt    HPI HPI: Pt is a 55 y.o. female with medical history significant for COPD, CHF, CVA with right residual deficit, dementia, hypertension, T2DM, CKD 3A and morbid obesity who presented to the ED due to AMS. Pt found to have leukocytosis and obstructing nephrolithiasis with bilateral hydronephrosis. Pt was taken to OR for stent placement and subsequently has become hypotensive. Dx sepsis and UTI. EEG 4/20: mild to moderate diffuse encephalopathy, nonspecific etiology. No seizures or epileptiform discharges. Pt was made NPO on admission and Cortrak placed 4/18.MRI brain 4/20: no acute intracranial abnormality      SLP Plan  Continue with current plan of care       Recommendations  Diet recommendations: Dysphagia 3 (mechanical soft);Thin liquid Liquids provided via: Cup;Straw Medication Administration: Crushed with puree (As tolerated) Supervision: Trained caregiver to feed patient Compensations:  Minimize environmental distractions;Slow rate;Small sips/bites (Ensure pt is fully awake/alert) Postural Changes and/or Swallow Maneuvers: Seated upright 90 degrees                Oral Care Recommendations: Oral care QID;Staff/trained caregiver to provide oral care Follow up Recommendations:  (Continue ST at next level of care) SLP Visit Diagnosis: Dysphagia, unspecified (R13.10) Plan: Continue with current plan of care       Julie Perazzo I. Vear Clock, MS, CCC-SLP Acute Rehabilitation Services Office number [phone] Pager [phone]               Julie Donovan 05/21/2020, 1:21 PM

## 2020-05-21 NOTE — TOC Progression Note (Addendum)
Transition of Care [facility]) - Progression Note    Patient Details  Name: Julie Donovan MRN: [id number] Date of Birth: 02-Aug-1965  Transition [facility] Care [facility]) CM/SW Contact  Erin Sons, [place] Phone Number: 05/21/2020, 2:54 PM  Clinical Narrative:     CSW called Jason Coop [phone] with [facility] in [place]. CSW to fax clinicals to [phone]. CSW explained that pt discharging to [place] tomorrow and [facility] can continue to coordinate if a SNF transfer can happen.   CSW called and updated pt's sister of plan. CSW faxed clinicals to [name]. Notified Clyve at [facility] of poss d/c tomorrow.   Expected Discharge Plan: Skilled Nursing Facility Barriers to Discharge: Continued Medical Work up  Expected Discharge Plan and Services Expected Discharge Plan: Skilled Nursing Facility       Living arrangements for the past 2 months: Skilled Nursing Facility                                       Social Determinants of Health (SDOH) Interventions    Readmission Risk Interventions No flowsheet data found.

## 2020-05-22 DIAGNOSIS — N132 Hydronephrosis with renal and ureteral calculous obstruction: Secondary | ICD-10-CM | POA: Diagnosis not present

## 2020-05-22 DIAGNOSIS — R4182 Altered mental status, unspecified: Secondary | ICD-10-CM | POA: Diagnosis not present

## 2020-05-22 DIAGNOSIS — E118 Type 2 diabetes mellitus with unspecified complications: Secondary | ICD-10-CM

## 2020-05-22 DIAGNOSIS — N179 Acute kidney failure, unspecified: Secondary | ICD-10-CM | POA: Diagnosis not present

## 2020-05-22 LAB — GLUCOSE, CAPILLARY: Glucose-Capillary: 350 mg/dL — ABNORMAL HIGH (ref 70–99)

## 2020-05-22 MED ORDER — ENSURE ENLIVE PO LIQD: 237.0000 mL | Freq: Three times a day (TID) | ORAL | 12 refills | Status: AC

## 2020-05-22 MED ORDER — AMOXICILLIN 250 MG/5ML PO SUSR: 500.0000 mg | Freq: Three times a day (TID) | ORAL | 0 refills | Status: DC

## 2020-05-22 MED ORDER — INSULIN GLARGINE 100 UNIT/ML ~~LOC~~ SOLN: 32.0000 [IU] | Freq: Every day | SUBCUTANEOUS | 11 refills | Status: AC

## 2020-05-22 MED ORDER — AMOXICILLIN 500 MG PO CAPS: 500.0000 mg | ORAL_CAPSULE | Freq: Three times a day (TID) | ORAL | Status: AC

## 2020-05-22 MED ORDER — INSULIN GLARGINE 100 UNIT/ML ~~LOC~~ SOLN
32.0000 [IU] | Freq: Every day | SUBCUTANEOUS | Status: DC
  Filled 2020-05-22 (×2): qty 0.32

## 2020-05-22 NOTE — Progress Notes (Signed)
Attempted to call report x2 at [place]. Will try again soon.

## 2020-05-22 NOTE — TOC Transition Note (Signed)
Transition of Care [facility]) - CM/SW Discharge Note   Patient Details  Name: Alyx Mcguirk MRN: [id number] Date of Birth: 1965/11/01  Transition of Care [facility]) CM/SW Contact:  Erin Sons, LCSW Phone Number: 05/22/2020, 10:25 AM   Clinical Narrative:     Patient will DC to: [facility] SNF Anticipated DC date: 05/22/20 Family notified: Earnest Rosier Transport by: Sharin Mons   Per MD patient ready for DC to [place] . RN, patient, patient's family, and facility notified of DC. Discharge Summary and FL2 sent to facility. RN to call report prior to discharge [phone]). DC packet on chart. Ambulance transport requested for patient.   CSW will sign off for now as social work intervention is no longer needed. Please consult [place] again if new needs arise.    Final next level of care: Skilled Nursing Facility Barriers to Discharge: No Barriers Identified   Patient Goals and CMS Choice        Discharge Placement              Patient chooses bed at: [facility] Patient to be transferred to facility by: [facility] Name of family member notified: Jeanine Luz SiSter Patient and family notified of of transfer: 05/22/20  Discharge Plan and Services                                     Social Determinants of Health (SDOH) Interventions     Readmission Risk Interventions No flowsheet data found.

## 2020-05-22 NOTE — Plan of Care (Signed)

## 2020-05-22 NOTE — Progress Notes (Signed)
Patient was discharged from [facility]. IV d/c'd, skin intact with the exception of flaky dryness and a rash on inner thighs. VS stable, patient has no complaints of pain. Belongings sent with patient. Patient will be transported to [place] via [place].

## 2020-05-22 NOTE — Discharge Summary (Addendum)
PATIENT DETAILS Name: Julie Donovan Age: 55 y.o. Sex: female Date of Birth: 10/26/1965 MRN: [id number]. Admitting Physician: Annett Fabian, MD WUJ:[id number], Denny Levy, MD  Admit Date: 05/10/2020 Discharge date: 05/22/2020  Recommendations for Outpatient Follow-up:  1. Follow up with PCP in 1-2 weeks 2. Please obtain CMP/CBC in one week 3. Please ensure follow-up with urology 4. Please ensure follow-up with infectious disease 5. Please repeat echocardiogram in 2-3 weeks to ensure stability of pericardial effusion.  Admitted From:  SNF  Disposition: SNF   Home Health: No  Equipment/Devices: None  Discharge Condition: Stable  CODE STATUS: FULL CODE  Diet recommendation:  Diet Order            Diet - low sodium heart healthy           Diet Carb Modified           DIET DYS 3 Room service appropriate? Yes; Fluid consistency: Thin  Diet effective now                  Brief Narrative: Patient is a 55 y.o. female with prior history of CVA-chronic right hemiparesis/aphasia, dementia, HTN, DM-2, chronic diastolic heart failure-presented with acute metabolic encephalopathy-further work-up revealed Proteus bacteremia in the setting of complicated UTI due to obstructive uropathy from bilateral ureteral stones.  Patient was seen by urology-underwent bilateral ureteral stent placement-following which patient became hypotensive-and required admission to the ICU for pressor support.  Upon further stability-she was transferred to the [facility] service.  Unfortunately hospital course was complicated by severe debility-worsening of her baseline dysphagia requiring NG tube feeding.  See below for further details.  Significant events: 4/17>> presented with AMS-found to have obstructive nephropathy with bilateral hydronephrosis-to OR for stent placement-subsequently became hypotensive.  To ICU for pressor support. 4/19>> transfer to [facility] 4/27>> NG tube removed  Significant  studies: 4/16>> CT head: No acute abnormalities 4/16>> CT chest: No pneumonia, moderate pericardial effusion 4/16>> CT abdomen/pelvis: Bilateral nephrolithiasis, right hydronephrosis due to 8 mm proximal right ureteral stone, left pelvicaliectasis due to 2 mm left UVJ stone 4/19>> Echo: EF 60-65%, moderate pericardial effusion-no tamponade. 4/20>> MRI brain: No acute infarct 4/20>> EEG: No seizures. 4/23>> bilateral lower extremity Doppler: No DVT.  Antimicrobial therapy: Vancomycin: 4/16 x 1 Flagyl: 4/16 x 1 Cefoxitin: 4/16 x 1 Cefepime: 4/17 x 1 Rocephin: 4/18>> 4/19 Ampicillin: 4/20>> 4/21 Amoxicillin: 4/21>>  Microbiology data: 4/16>> blood culture: Proteus Mirabilis 4/16 >>urine culture:Proteus Mirabilis 4/18>> blood culture: No growth  Procedures : 4/17>> cystoscopy with bilateral retrograde pyelogram-bilateral ureteral stent placement-Foley catheter placement 4/17>> left IJ CVC  Consults: PCCM, ID, urology, palliative care  Brief Hospital Course: Septic shock due to Proteus bacteremia in the setting of complicated UTI and obstructive uropathy: Sepsis physiology has resolved-patient is s/p bilateral ureteral stent placements.  Urology/ID followed closely during this hospital stay.  Urology planning on bilateral ureteroscopy and laser lithotripsy on 06/03/2020.  ID recommends to continue amoxicillin to get her through surgery on 5/10 and 3 days beyond that.  Please ensure follow-up with infectious disease and urology.  Acute metabolic encephalopathy: Due to sepsis-resolved-back to her baseline.  Neuroimaging/EEG as above.  AKI : AKI hemodynamically mediated-has resolved-creatinine back to baseline.  Pericardial effusion: Moderate-by echo-no tamponade by echo.  Suspect needs repeat echo in a few weeks.  Acute on chronic oropharyngeal dysphagia: Probably has chronic dysphagia due to prior CVA-worsened due to severe debility/deconditioning due to acute illness.   Required NG tube feeding-rapidly improving-tolerated dysphagia 3 diet-NG  tube discontinued on 4/27.    Suggest involvement of SLP while at SNF.  Normocytic anemia: Due to acute illness-no evidence of blood loss.  Follow.  Leukocytosis: Gradually improving-on antibiotics as above.  HTN: BP stable-we will switch to Coreg on discharge-amlodipine, lisinopril, hydralazine remain on hold-suspect as she continues to improve will require addition of these agents-we will defer to the attending MD at SNF.  HLD: Continue statin  DM-2: CBGs controlled-but slowly increasing now that her appetite has improved-increase Lantus to 32 units-continue SSI.  Attending MD at SNF to optimize further.    Seizure disorder: Continue Keppra.  EEG negative for seizures.  Prior history of CVA with resultant aphasia/right-sided hemiparesis:  Continue aspirin.  History of dementia-depression: Continue Abilify-Prozac.  Morbid Obesity: Estimated body mass index is 41.93 kg/m as calculated from the following:   Height as of this encounter:  (1.626 m).   Weight as of this encounter: 110.8 kg    Discharge Diagnoses:  Active Problems:   Sepsis (HCC)   Hydronephrosis with renal and ureteral calculus obstruction   Leukocytosis   Lactic acidosis   Hypoalbuminemia   Hyperglycemia   Acute kidney injury superimposed on CKD (HCC)   S/P cystoscopy with ureteral stent placement   Hydronephrosis with obstructing calculus   Discharge Instructions:  Activity:  As tolerated with Full fall precautions use walker/cane & assistance as needed   Discharge Instructions    Diet - low sodium heart healthy   Complete by: As directed    Diet Carb Modified   Complete by: As directed    Discharge instructions   Complete by: As directed    Follow with Primary MD  Karna Dupes, MD in 1-2 weeks  Please ensure follow-up with urology on 5/10  Please ensure follow-up with infectious disease  Please get a  complete blood count and chemistry panel checked by your Primary MD at your next visit, and again as instructed by your Primary MD.  Get Medicines reviewed and adjusted: Please take all your medications with you for your next visit with your Primary MD  Laboratory/radiological data: Please request your Primary MD to go over all hospital tests and procedure/radiological results at the follow up, please ask your Primary MD to get all Hospital records sent to his/her office.  In some cases, they will be blood work, cultures and biopsy results pending at the time of your discharge. Please request that your primary care M.D. follows up on these results.  Also Note the following: If you experience worsening of your admission symptoms, develop shortness of breath, life threatening emergency, suicidal or homicidal thoughts you must seek medical attention immediately by calling 911 or calling your MD immediately  if symptoms less severe.  You must read complete instructions/literature along with all the possible adverse reactions/side effects for all the Medicines you take and that have been prescribed to you. Take any new Medicines after you have completely understood and accpet all the possible adverse reactions/side effects.   Do not drive when taking Pain medications or sleeping medications (Benzodaizepines)  Do not take more than prescribed Pain, Sleep and Anxiety Medications. It is not advisable to combine anxiety,sleep and pain medications without talking with your primary care practitioner  Special Instructions: If you have smoked or chewed Tobacco  in the last 2 yrs please stop smoking, stop any regular Alcohol  and or any Recreational drug use.  Wear Seat belts while driving.  Please note: You were cared for by a  hospitalist during your hospital stay. Once you are discharged, your primary care physician will handle any further medical issues. Please note that NO REFILLS for any discharge  medications will be authorized once you are discharged, as it is imperative that you return to your primary care physician (or establish a relationship with a primary care physician if you do not have one) for your post hospital discharge needs so that they can reassess your need for medications and monitor your lab values.   Check CBGs before meals and at bedtime   Increase activity slowly   Complete by: As directed    No wound care   Complete by: As directed      Allergies as of 05/22/2020      Reactions   Latex Other (See Comments)   On MAR      Medication List    STOP taking these medications   amLODipine 10 MG tablet Commonly known as: NORVASC   calcium-vitamin D 500-200 MG-UNIT tablet Commonly known as: OSCAL WITH D   hydrALAZINE 25 MG tablet Commonly known as: APRESOLINE   lisinopril 5 MG tablet Commonly known as: ZESTRIL   vitamin B-12 500 MCG tablet Commonly known as: CYANOCOBALAMIN     TAKE these medications   acetaminophen 500 MG tablet Commonly known as: TYLENOL Take 1,000 mg by mouth in the morning and at bedtime.   amoxicillin 500 MG capsule Commonly known as: AMOXIL Take 1 capsule (500 mg total) by mouth 3 (three) times daily for 15 days.   ARIPiprazole 2 MG tablet Commonly known as: ABILIFY Take 2 mg by mouth at bedtime.   aspirin 325 MG EC tablet Take 325 mg by mouth daily. What changed: Another medication with the same name was removed. Continue taking this medication, and follow the directions you see here.   atorvastatin 80 MG tablet Commonly known as: LIPITOR Take 80 mg by mouth at bedtime.   bisacodyl 10 MG suppository Commonly known as: DULCOLAX Place 10 mg rectally as needed for moderate constipation.   calcium carbonate 500 MG chewable tablet Commonly known as: TUMS - dosed in mg elemental calcium Chew 2 tablets by mouth 2 (two) times daily.   carvedilol 12.5 MG tablet Commonly known as: COREG Take 12.5 mg by mouth 2 (two) times  daily with a meal.   cholecalciferol 25 MCG (1000 UNIT) tablet Commonly known as: VITAMIN D3 Take 1,000 Units by mouth daily.   cyclobenzaprine 10 MG tablet Commonly known as: FLEXERIL Take 10 mg by mouth at bedtime.   dicyclomine 10 MG capsule Commonly known as: BENTYL Take 10 mg by mouth every 8 (eight) hours as needed for spasms.   feeding supplement Liqd Take 237 mLs by mouth 3 (three) times daily between meals.   FLUoxetine 20 MG capsule Commonly known as: PROZAC Take 60 mg by mouth at bedtime.   hyoscyamine 0.125 MG tablet Commonly known as: LEVSIN Take 0.125 mg by mouth every 8 (eight) hours as needed (for IBS).   insulin glargine 100 UNIT/ML injection Commonly known as: LANTUS Inject 0.32 mLs (32 Units total) into the skin daily. What changed: how much to take   insulin lispro 100 UNIT/ML injection Commonly known as: HUMALOG Inject 2-12 Units into the skin 4 (four) times daily -  before meals and at bedtime. If 201-250=2 units 251-300=4 units 301-350=6 units 351-400=8 units 401-450=10 units 451-500=12 units If Glucose Reads High=14 units and recheck   levETIRAcetam 500 MG tablet Commonly known as: KEPPRA Take 500  mg by mouth 2 (two) times daily.   linagliptin 5 MG Tabs tablet Commonly known as: TRADJENTA Take 5 mg by mouth daily.   polyethylene glycol powder 17 GM/SCOOP powder Commonly known as: GLYCOLAX/MIRALAX Take 17 g by mouth daily.   promethazine 25 MG tablet Commonly known as: PHENERGAN Take 25 mg by mouth every 6 (six) hours as needed for nausea or vomiting.   senna-docusate 8.6-50 MG tablet Commonly known as: Senokot-S Take 2 tablets by mouth 2 (two) times daily.       Follow-up Information    Jerilee Field, MD Follow up on 06/03/2020.   Specialty: Urology Why: Office will call with appointment-but you are scheduled for a bilateral ureteroscopy and laser lithotripsy Contact information: [address] [place]  [address] [phone]        Daiva Eves, Lisette Grinder, MD Follow up on 05/28/2020.   Specialty: Infectious Diseases Why: Appointment at 9:30 AM. Contact information: [address] [place] [address] [phone]              Allergies  Allergen Reactions  . Latex Other (See Comments)    On MAR     Other Procedures/Studies: CT Abdomen Pelvis Wo Contrast  Result Date: 05/10/2020 CLINICAL DATA:  Sepsis, nausea, vomiting, altered mental status EXAM: CT CHEST, ABDOMEN AND PELVIS WITHOUT CONTRAST TECHNIQUE: Multidetector CT imaging of the chest, abdomen and pelvis was performed following the standard protocol without IV contrast. COMPARISON:  None. FINDINGS: CT CHEST FINDINGS Cardiovascular: Cardiomegaly. Moderate pericardial effusion. Probable stents in the left anterior descending coronary artery. Aorta normal caliber. Mediastinum/Nodes: No mediastinal, hilar, or axillary adenopathy. Trachea and esophagus are unremarkable. Thyroid unremarkable. Lungs/Pleura: Bibasilar atelectasis. No effusions or other confluent opacities. Musculoskeletal: Chest wall soft tissues are unremarkable. No acute bony abnormality. CT ABDOMEN PELVIS FINDINGS Hepatobiliary: No focal liver abnormality is seen. Status post cholecystectomy. No biliary dilatation. Pancreas: No focal abnormality or ductal dilatation. Spleen: No focal abnormality.  Normal size. Adrenals/Urinary Tract: Adrenal glands are unremarkable. Numerous bilateral renal stones the large stone on the right layering dependently in the renal pelvis measuring 9 mm and the largest on the left layering dependently in the renal pelvis measuring 7 mm. Proximal right ureteral stone measures 8 mm. Mild right hydronephrosis. 2 mm distal left ureteral stone. Mild pelvicaliectasis on the left. Urinary bladder decompressed, unremarkable. Stomach/Bowel: Stomach, large and small bowel grossly unremarkable. Vascular/Lymphatic: No evidence of aneurysm or adenopathy.  Scattered aortoiliac atherosclerosis. Reproductive: Uterus and adnexa unremarkable.  No mass. Other: Trace free fluid in the pelvis.  No free air. Musculoskeletal: No acute bony abnormality. IMPRESSION: Cardiomegaly.  Moderate pericardial effusion. Bibasilar atelectasis. Bilateral nephrolithiasis. Mild right hydronephrosis due to 8 mm proximal right ureteral stone. Mild left pelvicaliectasis due to 2 mm left UVJ stone. Electronically Signed   By: Charlett Nose M.D.   On: 05/10/2020 22:04   CT Head Wo Contrast  Result Date: 05/10/2020 CLINICAL DATA:  Altered mental status, sepsis EXAM: CT HEAD WITHOUT CONTRAST TECHNIQUE: Contiguous axial images were obtained from the base of the skull through the vertex without intravenous contrast. COMPARISON:  05/28/2019 FINDINGS: Brain: Bilateral basal ganglia and periventricular white matter lacunar infarcts, stable. There is atrophy and chronic small vessel disease changes. No acute intracranial abnormality. Specifically, no hemorrhage, hydrocephalus, mass lesion, acute infarction, or significant intracranial injury. Vascular: No hyperdense vessel or unexpected calcification. Skull: No acute calvarial abnormality. Sinuses/Orbits: Visualized paranasal sinuses and mastoids clear. Orbital soft tissues unremarkable. Other: None IMPRESSION: Bilateral lacunar  infarcts as above, chronic. Atrophy, chronic microvascular disease. No acute intracranial abnormality. Electronically Signed   By: Charlett Nose M.D.   On: 05/10/2020 21:48   CT Chest Wo Contrast  Result Date: 05/10/2020 CLINICAL DATA:  Sepsis, nausea, vomiting, altered mental status EXAM: CT CHEST, ABDOMEN AND PELVIS WITHOUT CONTRAST TECHNIQUE: Multidetector CT imaging of the chest, abdomen and pelvis was performed following the standard protocol without IV contrast. COMPARISON:  None. FINDINGS: CT CHEST FINDINGS Cardiovascular: Cardiomegaly. Moderate pericardial effusion. Probable stents in the left anterior descending  coronary artery. Aorta normal caliber. Mediastinum/Nodes: No mediastinal, hilar, or axillary adenopathy. Trachea and esophagus are unremarkable. Thyroid unremarkable. Lungs/Pleura: Bibasilar atelectasis. No effusions or other confluent opacities. Musculoskeletal: Chest wall soft tissues are unremarkable. No acute bony abnormality. CT ABDOMEN PELVIS FINDINGS Hepatobiliary: No focal liver abnormality is seen. Status post cholecystectomy. No biliary dilatation. Pancreas: No focal abnormality or ductal dilatation. Spleen: No focal abnormality.  Normal size. Adrenals/Urinary Tract: Adrenal glands are unremarkable. Numerous bilateral renal stones the large stone on the right layering dependently in the renal pelvis measuring 9 mm and the largest on the left layering dependently in the renal pelvis measuring 7 mm. Proximal right ureteral stone measures 8 mm. Mild right hydronephrosis. 2 mm distal left ureteral stone. Mild pelvicaliectasis on the left. Urinary bladder decompressed, unremarkable. Stomach/Bowel: Stomach, large and small bowel grossly unremarkable. Vascular/Lymphatic: No evidence of aneurysm or adenopathy. Scattered aortoiliac atherosclerosis. Reproductive: Uterus and adnexa unremarkable.  No mass. Other: Trace free fluid in the pelvis.  No free air. Musculoskeletal: No acute bony abnormality. IMPRESSION: Cardiomegaly.  Moderate pericardial effusion. Bibasilar atelectasis. Bilateral nephrolithiasis. Mild right hydronephrosis due to 8 mm proximal right ureteral stone. Mild left pelvicaliectasis due to 2 mm left UVJ stone. Electronically Signed   By: Charlett Nose M.D.   On: 05/10/2020 22:04   MR BRAIN WO CONTRAST  Result Date: 05/14/2020 CLINICAL DATA:  Neuro deficit, acute, stroke suspected. EXAM: MRI HEAD WITHOUT CONTRAST TECHNIQUE: Multiplanar, multiecho pulse sequences of the brain and surrounding structures were obtained without intravenous contrast. COMPARISON:  Prior head CT examinations 05/10/2020  and earlier. FINDINGS: Brain: Mild cerebral and cerebellar atrophy. As demonstrated previously, there are multiple chronic small-vessel infarcts within the bilateral corona radiata and deep gray nuclei. Associated subtle ex vacuo dilatation of the right lateral ventricle. Microhemorrhage within the lower pons. There is no acute infarct. No evidence of intracranial mass. No extra-axial fluid collection. No midline shift. Vascular: Expected proximal arterial flow voids. Skull and upper cervical spine: No focal marrow lesion. Sinuses/Orbits: Visualized orbits show no acute finding. Minimal bilateral ethmoid sinus mucosal thickening. Other: Trace fluid within the bilateral mastoid air cells. IMPRESSION: No evidence of acute intracranial abnormality, including acute infarction. As demonstrated previously, there are multiple chronic small-vessel infarcts within the bilateral corona radiata and deep gray nuclei. Mild generalized parenchymal atrophy. Trace bilateral mastoid effusions. Electronically Signed   By: Jackey Loge DO   On: 05/14/2020 13:03   DG Cystogram  Result Date: 05/11/2020 CLINICAL DATA:  Hydronephrosis due to RIGHT ureteral stone. EXAM: Retrograde pyelogram/ureteral stent placement. COMPARISON:  CT abdomen dated 05/10/2020. FINDINGS: Three fluoroscopic images are provided of a retrograde pyelogram. Final images show bilateral nephroureteral stents in place, grossly well positioned at the levels of the renal pelves. Fluoroscopy provided for 53 seconds. IMPRESSION: Intraoperative fluoroscopic images demonstrating bilateral nephroureteral stents in place, grossly well positioned at the levels of the renal pelves. Electronically Signed   By: Bary Richard M.D.   On:  05/11/2020 07:05   DG Chest Portable 1 View  Result Date: 05/10/2020 CLINICAL DATA:  Sepsis EXAM: PORTABLE CHEST 1 VIEW COMPARISON:  05/28/2019 FINDINGS: Cardiomegaly. Low lung volumes with bibasilar atelectasis. No effusions or overt  edema. No acute bony abnormality. IMPRESSION: Low lung volumes.  Cardiomegaly, bibasilar atelectasis. Electronically Signed   By: Charlett NoseKevin  Dover M.D.   On: 05/10/2020 20:00   EEG adult  Result Date: 05/14/2020 Charlsie QuestYadav, Priyanka O, MD     05/14/2020  8:47 AM Patient Name: Sebastian AcheBarbara Capraro MRN: [id number] Epilepsy Attending: Charlsie QuestPriyanka O Yadav Referring Physician/Provider: Dr Baldwin JamaicaHannah Masoud Date: 05/13/2020 Duration: 28.18 mins Patient history: 55 year old female with altered mental status.  EEG to evaluate for seizures. Level of alertness: Awake, asleep AEDs during EEG study: Keppra Technical aspects: This EEG study was done with scalp electrodes positioned according to the 10-20 International system of electrode placement. Electrical activity was acquired at a sampling rate of 500Hz  and reviewed with a high frequency filter of 70Hz  and a low frequency filter of 1Hz . EEG data were recorded continuously and digitally stored. Description: The posterior dominant rhythm consists of 8-9 Hz activity of moderate voltage (25-35 uV) seen predominantly in posterior head regions, symmetric and reactive to eye opening and eye closing. Sleep was characterized by vertex waves, maximal frontocentral region. EEG showed continuous generalized 3 to 6 Hz theta-delta slowing.  Photic driving was not seen during photic stimulation.  Hyperventilation was not performed.   ABNORMALITY - Continuous slow, generalized IMPRESSION: This study is suggestive of mild to moderate diffuse encephalopathy, nonspecific etiology. No seizures or epileptiform discharges were seen throughout the recording. Charlsie Questriyanka O Yadav   ECHOCARDIOGRAM COMPLETE BUBBLE STUDY  Result Date: 05/13/2020    ECHOCARDIOGRAM REPORT   Patient Name:   Britta MccreedyBARBARA Court Date of Exam: 05/13/2020 Medical Rec #:  [id number]     Height:       64.0 in Accession #:    [id number]    Weight:       235.0 lb Date of Birth:  [birth year]     BSA:          2.095 m Patient Age:    55 years      BP:            136/76 mmHg Patient Gender: F             HR:           104 bpm. Exam Location:  Inpatient Procedure: 2D Echo, Cardiac Doppler, Color Doppler and Saline Contrast Bubble            Study                                 MODIFIED REPORT:  This report was modified by Thurmon FairMihai Croitoru MD on 05/13/2020 due to comment re:                              absence of tamponade.  Indications:     I31.3 Pericardial effusion (noninflammatory)  History:         Patient has no prior history of Echocardiogram examinations.                  CHF, COPD and Stroke, Signs/Symptoms:Bacteremia and Altered                  Mental Status; Risk Factors:Hypertension and  Diabetes.                  Pericardial effusion.  Sonographer:     Sheralyn Boatman RDCS Referring Phys:  [id number] Baldwin Jamaica Diagnosing Phys: Thurmon Fair MD  Sonographer Comments: Technically difficult study due to poor echo windows and patient is morbidly obese. Image acquisition challenging due to patient body habitus. Supine, could not turn. Patient moaning throughout test. Very difficult windows. IMPRESSIONS  1. Left ventricular ejection fraction, by estimation, is 60 to 65%. The left ventricle has normal function. The left ventricle has no regional wall motion abnormalities. Indeterminate diastolic filling due to E-A fusion.  2. Right ventricular systolic function is normal. The right ventricular size is normal.  3. Moderate pericardial effusion. The pericardial effusion is circumferential, but mostly posterior to the left ventricle.     There is no evidence of pericardial tamponade  4. The mitral valve is normal in structure. No evidence of mitral valve regurgitation. No evidence of mitral stenosis.  5. The aortic valve is normal in structure. Aortic valve regurgitation is not visualized. No aortic stenosis is present.  6. The inferior vena cava is normal in size with greater than 50% respiratory variability, suggesting right atrial pressure of 3 mmHg.  7. Agitated saline  contrast bubble study was negative, with no evidence of any interatrial shunt. FINDINGS  Left Ventricle: Left ventricular ejection fraction, by estimation, is 60 to 65%. The left ventricle has normal function. The left ventricle has no regional wall motion abnormalities. The left ventricular internal cavity size was normal in size. There is  no left ventricular hypertrophy. Indeterminate diastolic filling due to E-A fusion. Right Ventricle: The right ventricular size is normal. No increase in right ventricular wall thickness. Right ventricular systolic function is normal. Left Atrium: Left atrial size was normal in size. Right Atrium: Right atrial size was normal in size. Pericardium: There is no evidence of pericardial tamponade. A moderately sized pericardial effusion is present. The pericardial effusion is circumferential and posterior to the left ventricle. There is no evidence of cardiac tamponade. Mitral Valve: The mitral valve is normal in structure. No evidence of mitral valve regurgitation. No evidence of mitral valve stenosis. Tricuspid Valve: The tricuspid valve is normal in structure. Tricuspid valve regurgitation is not demonstrated. No evidence of tricuspid stenosis. Aortic Valve: The aortic valve is normal in structure. Aortic valve regurgitation is not visualized. No aortic stenosis is present. Pulmonic Valve: The pulmonic valve was normal in structure. Pulmonic valve regurgitation is not visualized. No evidence of pulmonic stenosis. Aorta: The aortic root is normal in size and structure. Venous: The inferior vena cava is normal in size with greater than 50% respiratory variability, suggesting right atrial pressure of 3 mmHg. IAS/Shunts: No atrial level shunt detected by color flow Doppler. Agitated saline contrast was given intravenously to evaluate for intracardiac shunting. Agitated saline contrast bubble study was negative, with no evidence of any interatrial shunt.  LEFT VENTRICLE PLAX 2D LVIDd:          4.50 cm     Diastology LVIDs:         2.80 cm     LV e' medial:    5.22 cm/s LV PW:         1.30 cm     LV E/e' medial:  20.7 LV IVS:        1.10 cm     LV e' lateral:   8.38 cm/s LVOT diam:     2.00  cm     LV E/e' lateral: 12.9 LV SV:         44 LV SV Index:   21 LVOT Area:     3.14 cm  LV Volumes (MOD) LV vol d, MOD A2C: 87.6 ml LV vol d, MOD A4C: 91.4 ml LV vol s, MOD A2C: 42.3 ml LV vol s, MOD A4C: 34.4 ml LV SV MOD A2C:     45.3 ml LV SV MOD A4C:     91.4 ml LV SV MOD BP:      50.1 ml RIGHT VENTRICLE             IVC RV S prime:     13.80 cm/s  IVC diam: 1.70 cm TAPSE (M-mode): 2.0 cm LEFT ATRIUM             Index       RIGHT ATRIUM           Index LA diam:        3.10 cm 1.48 cm/m  RA Area:     10.70 cm LA Vol (A2C):   53.6 ml 25.58 ml/m RA Volume:   19.70 ml  9.40 ml/m LA Vol (A4C):   20.7 ml 9.88 ml/m LA Biplane Vol: 35.3 ml 16.85 ml/m  AORTIC VALVE LVOT Vmax:   102.00 cm/s LVOT Vmean:  61.300 cm/s LVOT VTI:    0.139 m  AORTA Ao Root diam: 3.00 cm Ao Asc diam:  3.60 cm MITRAL VALVE MV Area (PHT): 5.27 cm     SHUNTS MV Decel Time: 144 msec     Systemic VTI:  0.14 m MV E velocity: 108.00 cm/s  Systemic Diam: 2.00 cm Rachelle Hora Croitoru MD Electronically signed by Thurmon Fair MD Signature Date/Time: 05/13/2020/11:37:41 AM    Final (Updated)    VAS [place] LOWER EXTREMITY VENOUS (DVT)  Result Date: 05/18/2020  Lower Venous DVT Study Patient Name:  BREAHNA BOYLEN  Date of Exam:   05/17/2020 Medical Rec #: [id number]      Accession #:    [id number] Date of Birth: May 05, 1965      Patient Gender: F Patient Age:   49Y Exam Location:  [facility] - Inpatient Procedure:      VAS [place] LOWER EXTREMITY VENOUS (DVT) Referring Phys: [id number] FANG XU --------------------------------------------------------------------------------  Indications: Edema.  Comparison Study: No prior study Performing Technologist: Sherren Kerns RVS  Examination Guidelines: A complete evaluation includes B-mode imaging, spectral Doppler, color  Doppler, and power Doppler as needed of all accessible portions of each vessel. Bilateral testing is considered an integral part of a complete examination. Limited examinations for reoccurring indications may be performed as noted. The reflux portion of the exam is performed with the patient in reverse Trendelenburg.  +---------+---------------+---------+-----------+----------+--------------+ RIGHT    CompressibilityPhasicitySpontaneityPropertiesThrombus Aging +---------+---------------+---------+-----------+----------+--------------+ CFV      Full           Yes      Yes                                 +---------+---------------+---------+-----------+----------+--------------+ SFJ      Full                                                        +---------+---------------+---------+-----------+----------+--------------+ FV Prox  Full                                                        +---------+---------------+---------+-----------+----------+--------------+ FV Mid   Full                                                        +---------+---------------+---------+-----------+----------+--------------+ FV DistalFull                                                        +---------+---------------+---------+-----------+----------+--------------+ PFV      Full                                                        +---------+---------------+---------+-----------+----------+--------------+ POP      Full           Yes      Yes                                 +---------+---------------+---------+-----------+----------+--------------+ PTV      Full                                                        +---------+---------------+---------+-----------+----------+--------------+ PERO     Full                                                        +---------+---------------+---------+-----------+----------+--------------+    +---------+---------------+---------+-----------+----------+--------------+ LEFT     CompressibilityPhasicitySpontaneityPropertiesThrombus Aging +---------+---------------+---------+-----------+----------+--------------+ CFV      Full           Yes      Yes                                 +---------+---------------+---------+-----------+----------+--------------+ SFJ      Full                                                        +---------+---------------+---------+-----------+----------+--------------+ FV Prox  Full                                                        +---------+---------------+---------+-----------+----------+--------------+  FV Mid   Full                                                        +---------+---------------+---------+-----------+----------+--------------+ FV DistalFull                                                        +---------+---------------+---------+-----------+----------+--------------+ PFV      Full                                                        +---------+---------------+---------+-----------+----------+--------------+ POP      Full           Yes      Yes                                 +---------+---------------+---------+-----------+----------+--------------+ PTV      Full                                                        +---------+---------------+---------+-----------+----------+--------------+ PERO     Full                                                        +---------+---------------+---------+-----------+----------+--------------+     Summary: BILATERAL: - No evidence of deep vein thrombosis seen in the lower extremities, bilaterally. -   *See table(s) above for measurements and observations. Electronically signed by Heath Lark on 05/18/2020 at 8:52:19 AM.    Final      TODAY-DAY OF DISCHARGE:  Subjective:   Joetta Delprado today has no headache,no chest abdominal  pain,no new weakness tingling or numbness, feels much better wants to go home today.   Objective:   Blood pressure (!) 145/76, pulse 90, temperature 98.5 F (36.9 C), temperature source Oral, resp. rate 16, height 5\' 4"  (1.626 m), weight 108.9 kg, SpO2 100 %.  Intake/Output Summary (Last 24 hours) at 05/22/2020 0959 Last data filed at 05/22/2020 0600 Gross per 24 hour  Intake 947 ml  Output 2401 ml  Net -1454 ml   Filed Weights   05/18/20 0300 05/19/20 0500 05/22/20 0445  Weight: 111.9 kg 110.8 kg 108.9 kg    Exam: Awake Alert, Oriented *3, No new F.N deficits, Normal affect [place].AT,PERRAL Supple Neck,No JVD, No cervical lymphadenopathy appriciated.  Symmetrical Chest wall movement, Good air movement bilaterally, CTAB RRR,No Gallops,Rubs or new Murmurs, No Parasternal Heave +ve B.Sounds, Abd Soft, Non tender, No organomegaly appriciated, No rebound -guarding or rigidity. No Cyanosis, Clubbing or edema, No new Rash or bruise   PERTINENT RADIOLOGIC STUDIES: No results found.  PERTINENT LAB RESULTS: CBC: Recent Labs    05/20/20 0230 05/21/20 0400  WBC 14.1* 14.1*  HGB 10.0* 10.5*  HCT 32.0* 32.8*  PLT 249 316   CMET CMP     Component Value Date/Time   NA 135 05/21/2020 0400   K 4.7 05/21/2020 0400   CL 98 05/21/2020 0400   CO2 29 05/21/2020 0400   GLUCOSE 175 (H) 05/21/2020 0400   BUN 22 (H) 05/21/2020 0400   CREATININE 0.85 05/21/2020 0400   CALCIUM 8.6 (L) 05/21/2020 0400   PROT 5.8 (L) 05/21/2020 0400   ALBUMIN 2.6 (L) 05/21/2020 0400   AST 18 05/21/2020 0400   ALT 22 05/21/2020 0400   ALKPHOS 126 05/21/2020 0400   BILITOT 0.2 (L) 05/21/2020 0400   GFRNONAA >60 05/21/2020 0400   GFRAA >60 05/28/2019 1815    GFR Estimated Creatinine Clearance: 90.2 mL/min (by C-G formula based on SCr of 0.85 mg/dL). No results for input(s): LIPASE, AMYLASE in the last 72 hours. No results for input(s): CKTOTAL, CKMB, CKMBINDEX, TROPONINI in the last 72 hours. Invalid  input(s): POCBNP No results for input(s): DDIMER in the last 72 hours. No results for input(s): HGBA1C in the last 72 hours. No results for input(s): CHOL, HDL, LDLCALC, TRIG, CHOLHDL, LDLDIRECT in the last 72 hours. No results for input(s): TSH, T4TOTAL, T3FREE, THYROIDAB in the last 72 hours.  Invalid input(s): FREET3 No results for input(s): VITAMINB12, FOLATE, FERRITIN, TIBC, IRON, RETICCTPCT in the last 72 hours. Coags: No results for input(s): INR in the last 72 hours.  Invalid input(s): PT Microbiology: Recent Results (from the past 240 hour(s))  Culture, blood (routine x 2)     Status: None   Collection Time: 05/12/20 10:50 AM   Specimen: BLOOD  Result Value Ref Range Status   Specimen Description BLOOD LEFT ANTECUBITAL  Final   Special Requests   Final    BOTTLES DRAWN AEROBIC AND ANAEROBIC Blood Culture adequate volume   Culture   Final    NO GROWTH 5 DAYS Performed at [facility], [address], [place], [place] [address]    Report Status 05/17/2020 FINAL  Final  Culture, blood (routine x 2)     Status: None   Collection Time: 05/12/20 10:55 AM   Specimen: BLOOD  Result Value Ref Range Status   Specimen Description BLOOD LEFT ANTECUBITAL  Final   Special Requests   Final    BOTTLES DRAWN AEROBIC AND ANAEROBIC Blood Culture adequate volume   Culture   Final    NO GROWTH 5 DAYS Performed at [facility], [address], [place], [place] [address]    Report Status 05/17/2020 FINAL  Final  SARS CORONAVIRUS 2 (TAT 6-24 HRS) Nasopharyngeal Nasopharyngeal Swab     Status: None   Collection Time: 05/21/20  3:16 PM   Specimen: Nasopharyngeal Swab  Result Value Ref Range Status   SARS Coronavirus 2 NEGATIVE NEGATIVE Final    Comment: (NOTE) SARS-CoV-2 target nucleic acids are NOT DETECTED.  The SARS-CoV-2 RNA is generally detectable in upper and lower respiratory specimens during the acute phase of infection. Negative results do not preclude SARS-CoV-2  infection, do not rule out co-infections with other pathogens, and should not be used as the sole basis for treatment or other patient management decisions. Negative results must be combined with clinical observations, patient history, and epidemiological information. The expected result is Negative.  Fact Sheet for Patients: HairSlick.no  Fact Sheet for Healthcare Providers: quierodirigir.com  This test is not yet approved or cleared by the [place] and  has been authorized for detection and/or diagnosis of SARS-CoV-2 by FDA under an Emergency Use Authorization (EUA). This EUA will remain  in effect (meaning this test can be used) for the duration of the COVID-19 declaration under Se ction 564(b)(1) of the Act, 21 U.S.C. section 360bbb-3(b)(1), unless the authorization is terminated or revoked sooner.  Performed at [facility], [address], [place], [place] [address]     FURTHER DISCHARGE INSTRUCTIONS:  Get Medicines reviewed and adjusted: Please take all your medications with you for your next visit with your Primary MD  Laboratory/radiological data: Please request your Primary MD to go over all hospital tests and procedure/radiological results at the follow up, please ask your Primary MD to get all Hospital records sent to his/her office.  In some cases, they will be blood work, cultures and biopsy results pending at the time of your discharge. Please request that your primary care M.D. goes through all the records of your hospital data and follows up on these results.  Also Note the following: If you experience worsening of your admission symptoms, develop shortness of breath, life threatening emergency, suicidal or homicidal thoughts you must seek medical attention immediately by calling 911 or calling your MD immediately  if symptoms less severe.  You must read complete instructions/literature along  with all the possible adverse reactions/side effects for all the Medicines you take and that have been prescribed to you. Take any new Medicines after you have completely understood and accpet all the possible adverse reactions/side effects.   Do not drive when taking Pain medications or sleeping medications (Benzodaizepines)  Do not take more than prescribed Pain, Sleep and Anxiety Medications. It is not advisable to combine anxiety,sleep and pain medications without talking with your primary care practitioner  Special Instructions: If you have smoked or chewed Tobacco  in the last 2 yrs please stop smoking, stop any regular Alcohol  and or any Recreational drug use.  Wear Seat belts while driving.  Please note: You were cared for by a hospitalist during your hospital stay. Once you are discharged, your primary care physician will handle any further medical issues. Please note that NO REFILLS for any discharge medications will be authorized once you are discharged, as it is imperative that you return to your primary care physician (or establish a relationship with a primary care physician if you do not have one) for your post hospital discharge needs so that they can reassess your need for medications and monitor your lab values.  Total Time spent coordinating discharge including counseling, education and face to face time equals 35 minutes.  SignedJeoffrey Massed 05/22/2020 9:59 AM

## 2020-05-28 ENCOUNTER — Encounter: Payer: Self-pay | Admitting: Infectious Disease

## 2020-05-28 ENCOUNTER — Other Ambulatory Visit: Payer: Self-pay

## 2020-05-28 ENCOUNTER — Ambulatory Visit (INDEPENDENT_AMBULATORY_CARE_PROVIDER_SITE_OTHER): Payer: Medicaid Other | Admitting: Infectious Disease

## 2020-05-28 VITALS — BP 105/75 | HR 94

## 2020-05-28 DIAGNOSIS — N189 Chronic kidney disease, unspecified: Secondary | ICD-10-CM

## 2020-05-28 DIAGNOSIS — A498 Other bacterial infections of unspecified site: Secondary | ICD-10-CM

## 2020-05-28 DIAGNOSIS — N179 Acute kidney failure, unspecified: Secondary | ICD-10-CM

## 2020-05-28 DIAGNOSIS — N200 Calculus of kidney: Secondary | ICD-10-CM

## 2020-05-28 DIAGNOSIS — N10 Acute tubulo-interstitial nephritis: Secondary | ICD-10-CM

## 2020-05-28 DIAGNOSIS — Z960 Presence of urogenital implants: Secondary | ICD-10-CM | POA: Diagnosis not present

## 2020-05-28 DIAGNOSIS — N132 Hydronephrosis with renal and ureteral calculous obstruction: Secondary | ICD-10-CM

## 2020-05-28 DIAGNOSIS — R7881 Bacteremia: Secondary | ICD-10-CM | POA: Diagnosis not present

## 2020-05-28 DIAGNOSIS — N390 Urinary tract infection, site not specified: Secondary | ICD-10-CM

## 2020-05-28 HISTORY — DX: Other bacterial infections of unspecified site: A49.8

## 2020-05-28 HISTORY — DX: Urinary tract infection, site not specified: N39.0

## 2020-05-28 HISTORY — DX: Bacteremia: R78.81

## 2020-05-28 NOTE — Progress Notes (Signed)
Subjective:  Chief complaint follow-up for severe Proteus UTI with bacteremia   Patient ID: Julie Donovan, female    DOB: Feb 22, 1965, 55 y.o.   MRN: [id number]  HPI   Molly Maduro is a 55 year old Caucasian female with a history of stroke and residual right-sided weakness along with expressive aphasia who was admitted to [facility] with encephalopathy and septic shock.  She was fluid resuscitated have significant pyuria.  CT of chest abdomen pelvis showed moderate pericardial effusion as well as extensive bilateral nephrolithiasis with obstructive uropathy and stones in both ureters.  She was taken by urology to the operating room where she underwent cystoscopy and placement of stents bilaterally urine and blood both grew Proteus mirabilis, that was to all antibiotics tested except for ciprofloxacin.  She was treated with intravenous antibiotics and then transitioned to oral amoxicillin.  She is going to have a lithotripsy on 10 May with urology.  We kept her on amoxicillin to try to prevent recurrence of her infection prior to her lithotripsy in hopes that lithotripsy will help remove potential persistent nidus of this bacteria in her stents.  Urology like her to stay on antibiotics for 3 days post lithotripsy.  She has been discharged to skilled nursing facility and is fortunately taking amoxicillin which I was quite happy to see.  She seems much improved.  Spoke to be much more today than she did in the hospital.    Past Medical History:  Diagnosis Date  . Anemia   . Aphasia   . Bacterial infection due to Proteus mirabilis 05/28/2020  . CHF (congestive heart failure) (HCC)    chronic diastolic heart failure  . CKD (chronic kidney disease)    chronic kidney disease stage III  . COPD (chronic obstructive pulmonary disease) (HCC)   . CVA (cerebral vascular accident) (HCC)    left-sided CVA, right-sided deficits with aphasia  . Dementia (HCC)   . Encephalopathy 05/14/2020   mild to  moderate diffuse encephalopathy  . Gram-negative bacteremia 05/28/2020  . Hydronephrosis 04/2020   Proteus bacteremia secondary to complicated UTI with bilateral Hydro nephrosis   . Hypertension   . Morbid obesity (HCC)   . Septic shock (HCC) 04/2020   Complication from UTI  . Type 2 diabetes mellitus (HCC)   . UTI (urinary tract infection) 05/28/2020    Past Surgical History:  Procedure Laterality Date  . CYSTOSCOPY W/ URETERAL STENT PLACEMENT Bilateral 05/11/2020   Procedure: CYSTOSCOPY WITH RETROGRADE PYELOGRAM/URETERAL STENT PLACEMENT;  Surgeon: Jerilee Field, MD;  Location: [facility] OR;  Service: Urology;  Laterality: Bilateral;    No family history on file.    Social History   Socioeconomic History  . Marital status: Single    Spouse name: Not on file  . Number of children: Not on file  . Years of education: Not on file  . Highest education level: Not on file  Occupational History  . Not on file  Tobacco Use  . Smoking status: Former Games developer  . Smokeless tobacco: Former Engineer, water and Sexual Activity  . Alcohol use: Not Currently  . Drug use: Not Currently  . Sexual activity: Not on file  Other Topics Concern  . Not on file  Social History Narrative  . Not on file   Social Determinants of Health   Financial Resource Strain: Not on file  Food Insecurity: Not on file  Transportation Needs: Not on file  Physical Activity: Not on file  Stress: Not on file  Social Connections: Not on file    Allergies  Allergen Reactions  . Latex Other (See Comments)    On MAR     Current Outpatient Medications:  .  acetaminophen (TYLENOL) 500 MG tablet, Take 1,000 mg by mouth in the morning and at bedtime., Disp: , Rfl:  .  amoxicillin (AMOXIL) 500 MG capsule, Take 1 capsule (500 mg total) by mouth 3 (three) times daily for 15 days., Disp: , Rfl:  .  ARIPiprazole (ABILIFY) 2 MG tablet, Take 2 mg by mouth at bedtime., Disp: , Rfl:  .  aspirin 325 MG EC tablet, Take 325 mg  by mouth daily., Disp: , Rfl:  .  atorvastatin (LIPITOR) 80 MG tablet, Take 80 mg by mouth at bedtime., Disp: , Rfl:  .  bisacodyl (DULCOLAX) 10 MG suppository, Place 10 mg rectally as needed for moderate constipation., Disp: , Rfl:  .  calcium carbonate (TUMS - DOSED IN MG ELEMENTAL CALCIUM) 500 MG chewable tablet, Chew 2 tablets by mouth 2 (two) times daily., Disp: , Rfl:  .  carvedilol (COREG) 12.5 MG tablet, Take 12.5 mg by mouth 2 (two) times daily with a meal., Disp: , Rfl:  .  cholecalciferol (VITAMIN D3) 25 MCG (1000 UNIT) tablet, Take 1,000 Units by mouth daily., Disp: , Rfl:  .  cyclobenzaprine (FLEXERIL) 10 MG tablet, Take 10 mg by mouth at bedtime., Disp: , Rfl:  .  dicyclomine (BENTYL) 10 MG capsule, Take 10 mg by mouth every 8 (eight) hours as needed for spasms., Disp: , Rfl:  .  feeding supplement (ENSURE ENLIVE / ENSURE PLUS) LIQD, Take 237 mLs by mouth 3 (three) times daily between meals., Disp: 237 mL, Rfl: 12 .  FLUoxetine (PROZAC) 20 MG capsule, Take 60 mg by mouth at bedtime., Disp: , Rfl:  .  hyoscyamine (LEVSIN) 0.125 MG tablet, Take 0.125 mg by mouth every 8 (eight) hours as needed (for IBS)., Disp: , Rfl:  .  insulin glargine (LANTUS) 100 UNIT/ML injection, Inject 0.32 mLs (32 Units total) into the skin daily., Disp: 10 mL, Rfl: 11 .  insulin lispro (HUMALOG) 100 UNIT/ML injection, Inject 2-12 Units into the skin 4 (four) times daily -  before meals and at bedtime. If 201-250=2 units 251-300=4 units 301-350=6 units 351-400=8 units 401-450=10 units 451-500=12 units If Glucose Reads High=14 units and recheck, Disp: , Rfl:  .  levETIRAcetam (KEPPRA) 500 MG tablet, Take 500 mg by mouth 2 (two) times daily., Disp: , Rfl:  .  linagliptin (TRADJENTA) 5 MG TABS tablet, Take 5 mg by mouth daily., Disp: , Rfl:  .  polyethylene glycol powder (GLYCOLAX/MIRALAX) 17 GM/SCOOP powder, Take 17 g by mouth daily., Disp: , Rfl:  .  promethazine (PHENERGAN) 25 MG tablet, Take 25 mg by mouth every  6 (six) hours as needed for nausea or vomiting., Disp: , Rfl:  .  senna-docusate (SENOKOT-S) 8.6-50 MG tablet, Take 2 tablets by mouth 2 (two) times daily., Disp: , Rfl:     Review of Systems  Unable to perform ROS: Other       Objective:   Physical Exam Constitutional:      General: She is not in acute distress.    Appearance: Normal appearance. She is well-developed. She is not ill-appearing or diaphoretic.  HENT:     Head: Normocephalic and atraumatic.     Right Ear: Hearing and external ear normal.     Left Ear: Hearing and external ear normal.     Nose: No nasal  deformity or rhinorrhea.  Eyes:     General: No scleral icterus.    Conjunctiva/sclera: Conjunctivae normal.     Right eye: Right conjunctiva is not injected.     Left eye: Left conjunctiva is not injected.     Pupils: Pupils are equal, round, and reactive to light.  Neck:     Vascular: No JVD.  Cardiovascular:     Rate and Rhythm: Normal rate and regular rhythm.     Heart sounds: Normal heart sounds, S1 normal and S2 normal. No murmur heard. No friction rub.  Abdominal:     General: Bowel sounds are normal. There is no distension.     Palpations: Abdomen is soft.     Tenderness: There is no abdominal tenderness.  Musculoskeletal:        General: Normal range of motion.     Right shoulder: Normal.     Left shoulder: Normal.     Cervical back: Normal range of motion and neck supple.     Right hip: Normal.     Left hip: Normal.     Right knee: Normal.     Left knee: Normal.  Lymphadenopathy:     Head:     Right side of head: No submandibular, preauricular or posterior auricular adenopathy.     Left side of head: No submandibular, preauricular or posterior auricular adenopathy.     Cervical: No cervical adenopathy.     Right cervical: No superficial or deep cervical adenopathy.    Left cervical: No superficial or deep cervical adenopathy.  Skin:    General: Skin is warm and dry.     Coloration: Skin is  not pale.     Findings: No abrasion, bruising, ecchymosis, erythema, lesion or rash.     Nails: There is no clubbing.  Neurological:     Mental Status: She is alert and oriented to person, place, and time.     Comments: Right-sided weakness persists  Psychiatric:        Attention and Perception: Attention normal. She is attentive.        Mood and Affect: Affect is flat.        Speech: Speech is delayed.        Behavior: Behavior is cooperative.        Cognition and Memory: Cognition is impaired.     Comments: She is speaking much better than when I saw her in the hospital           Assessment & Plan:   Severe Proteus mirabilis urinary tract infection with bacteremia, complicated by obstructive stones in bilateral ureters requiring placement of stents by urology.  As mentioned lithotripsy is planned for 10 May.  We placed her on oral amoxicillin after finishing IV therapy to make sure that this infection is kept at bay so that she can get through her lithotripsy and remove the stones which very well may be harboring Proteus still.  She can come and follow up with [place] as needed  I spent greater than 30 minutes with the patient including greater than 50% of time in face to face counsel of the patient review of all the records and in coordination of her care.

## 2020-06-02 ENCOUNTER — Other Ambulatory Visit: Payer: Self-pay

## 2020-06-02 MED ORDER — GENTAMICIN SULFATE 40 MG/ML IJ SOLN
5.0000 mg/kg | INTRAVENOUS | Status: AC
  Administered 2020-06-03: 380 mg via INTRAVENOUS
  Filled 2020-06-02: qty 9.5

## 2020-06-02 NOTE — Anesthesia Preprocedure Evaluation (Addendum)
Anesthesia Evaluation  Patient identified by MRN, date of birth, ID band Patient awake    Reviewed: Allergy & Precautions, NPO status , Patient's Chart, lab work & pertinent test results  Airway Mallampati: III  TM Distance: >3 FB Neck ROM: Full    Dental no notable dental hx. (+) Poor Dentition, Dental Advisory Given,    Pulmonary COPD,  COPD inhaler, former smoker,    Pulmonary exam normal breath sounds clear to auscultation       Cardiovascular hypertension, Pt. on medications and Pt. on home beta blockers +CHF  Normal cardiovascular exam Rhythm:Regular Rate:Normal     Neuro/Psych CVA, Residual Symptoms    GI/Hepatic negative GI ROS, Neg liver ROS,   Endo/Other  diabetes, Type 1, Insulin DependentMorbid obesity  Renal/GU Renal disease     Musculoskeletal   Abdominal (+) + obese,   Peds  Hematology   Anesthesia Other Findings   Reproductive/Obstetrics                            Anesthesia Physical Anesthesia Plan  ASA: III  Anesthesia Plan: General   Post-op Pain Management:    Induction: Intravenous  PONV Risk Score and Plan: 4 or greater and Treatment may vary due to age or medical condition and Ondansetron  Airway Management Planned: LMA  Additional Equipment: None  Intra-op Plan:   Post-operative Plan:   Informed Consent: I have reviewed the patients History and Physical, chart, labs and discussed the procedure including the risks, benefits and alternatives for the proposed anesthesia with the patient or authorized representative who has indicated his/her understanding and acceptance.     Dental advisory given  Plan Discussed with:   Anesthesia Plan Comments:        Anesthesia Quick Evaluation

## 2020-06-03 ENCOUNTER — Ambulatory Visit (HOSPITAL_COMMUNITY): Payer: Medicaid Other | Admitting: Physician Assistant

## 2020-06-03 ENCOUNTER — Encounter (HOSPITAL_COMMUNITY): Payer: Self-pay | Admitting: Urology

## 2020-06-03 ENCOUNTER — Ambulatory Visit (HOSPITAL_COMMUNITY): Payer: Medicaid Other

## 2020-06-03 ENCOUNTER — Ambulatory Visit (HOSPITAL_COMMUNITY)
Admission: RE | Admit: 2020-06-03 | Discharge: 2020-06-03 | Disposition: A | Discharge status: Home / Self Care | Payer: Medicaid Other | Source: Ambulatory Visit | Attending: Urology | Admitting: Urology

## 2020-06-03 ENCOUNTER — Encounter (HOSPITAL_COMMUNITY)
Admission: RE | Disposition: A | Discharge status: Home / Self Care | Payer: Self-pay | Source: Ambulatory Visit | Attending: Urology

## 2020-06-03 DIAGNOSIS — N200 Calculus of kidney: Secondary | ICD-10-CM

## 2020-06-03 DIAGNOSIS — Z20822 Contact with and (suspected) exposure to covid-19: Secondary | ICD-10-CM | POA: Insufficient documentation

## 2020-06-03 DIAGNOSIS — Z8673 Personal history of transient ischemic attack (TIA), and cerebral infarction without residual deficits: Secondary | ICD-10-CM | POA: Diagnosis not present

## 2020-06-03 DIAGNOSIS — Z91040 Latex allergy status: Secondary | ICD-10-CM | POA: Insufficient documentation

## 2020-06-03 DIAGNOSIS — J449 Chronic obstructive pulmonary disease, unspecified: Secondary | ICD-10-CM | POA: Diagnosis not present

## 2020-06-03 DIAGNOSIS — I130 Hypertensive heart and chronic kidney disease with heart failure and stage 1 through stage 4 chronic kidney disease, or unspecified chronic kidney disease: Secondary | ICD-10-CM | POA: Insufficient documentation

## 2020-06-03 DIAGNOSIS — I509 Heart failure, unspecified: Secondary | ICD-10-CM | POA: Diagnosis not present

## 2020-06-03 DIAGNOSIS — N132 Hydronephrosis with renal and ureteral calculous obstruction: Secondary | ICD-10-CM | POA: Insufficient documentation

## 2020-06-03 DIAGNOSIS — Z87891 Personal history of nicotine dependence: Secondary | ICD-10-CM | POA: Insufficient documentation

## 2020-06-03 DIAGNOSIS — N189 Chronic kidney disease, unspecified: Secondary | ICD-10-CM | POA: Diagnosis not present

## 2020-06-03 DIAGNOSIS — E1122 Type 2 diabetes mellitus with diabetic chronic kidney disease: Secondary | ICD-10-CM | POA: Insufficient documentation

## 2020-06-03 HISTORY — DX: Unspecified dementia, unspecified severity, without behavioral disturbance, psychotic disturbance, mood disturbance, and anxiety: F03.90

## 2020-06-03 HISTORY — PX: URETEROSCOPY WITH HOLMIUM LASER LITHOTRIPSY: SHX6645

## 2020-06-03 HISTORY — DX: Aphasia: R47.01

## 2020-06-03 HISTORY — DX: Morbid (severe) obesity due to excess calories: E66.01

## 2020-06-03 HISTORY — DX: Anemia, unspecified: D64.9

## 2020-06-03 LAB — CBC
HCT: 42.4 % (ref 36.0–46.0)
Hemoglobin: 13.2 g/dL (ref 12.0–15.0)
MCH: 30.5 pg (ref 26.0–34.0)
MCHC: 31.1 g/dL (ref 30.0–36.0)
MCV: 97.9 fL (ref 80.0–100.0)
Platelets: 384 10*3/uL (ref 150–400)
RBC: 4.33 MIL/uL (ref 3.87–5.11)
RDW: 13.3 % (ref 11.5–15.5)
WBC: 10.3 10*3/uL (ref 4.0–10.5)
nRBC: 0 % (ref 0.0–0.2)

## 2020-06-03 LAB — HEMOGLOBIN A1C
Hgb A1c MFr Bld: 6.4 % — ABNORMAL HIGH (ref 4.8–5.6)
Mean Plasma Glucose: 136.98 mg/dL

## 2020-06-03 LAB — BASIC METABOLIC PANEL
Anion gap: 12 (ref 5–15)
BUN: 17 mg/dL (ref 6–20)
CO2: 22 mmol/L (ref 22–32)
Calcium: 8.9 mg/dL (ref 8.9–10.3)
Chloride: 104 mmol/L (ref 98–111)
Creatinine, Ser: 0.97 mg/dL (ref 0.44–1.00)
GFR, Estimated: >60 mL/min (ref >60)
Glucose, Bld: 136 mg/dL — ABNORMAL HIGH (ref 70–99)
Potassium: 4.7 mmol/L (ref 3.5–5.1)
Sodium: 138 mmol/L (ref 135–145)

## 2020-06-03 LAB — GLUCOSE, CAPILLARY
Glucose-Capillary: 118 mg/dL — ABNORMAL HIGH (ref 70–99)
Glucose-Capillary: 122 mg/dL — ABNORMAL HIGH (ref 70–99)

## 2020-06-03 LAB — RESP PANEL BY RT-PCR (FLU A&B, COVID) ARPGX2
Influenza A by PCR: NEGATIVE
Influenza B by PCR: NEGATIVE
SARS Coronavirus 2 by RT PCR: NEGATIVE

## 2020-06-03 SURGERY — URETEROSCOPY, WITH LITHOTRIPSY USING HOLMIUM LASER
Anesthesia: General | Laterality: Bilateral

## 2020-06-03 MED ORDER — FENTANYL CITRATE (PF) 100 MCG/2ML IJ SOLN
INTRAMUSCULAR | Status: AC
  Filled 2020-06-03: qty 2

## 2020-06-03 MED ORDER — ONDANSETRON HCL 4 MG/2ML IJ SOLN
INTRAMUSCULAR | Status: DC | PRN
  Administered 2020-06-03: 4 mg via INTRAVENOUS

## 2020-06-03 MED ORDER — LABETALOL HCL 5 MG/ML IV SOLN
INTRAVENOUS | Status: AC
  Filled 2020-06-03: qty 4

## 2020-06-03 MED ORDER — CEFAZOLIN SODIUM-DEXTROSE 2-4 GM/100ML-% IV SOLN
2.0000 g | Freq: Once | INTRAVENOUS | Status: AC
  Administered 2020-06-03: 2 g via INTRAVENOUS

## 2020-06-03 MED ORDER — LACTATED RINGERS IV SOLN: INTRAVENOUS | Status: DC

## 2020-06-03 MED ORDER — CEFAZOLIN SODIUM-DEXTROSE 2-4 GM/100ML-% IV SOLN
INTRAVENOUS | Status: AC
  Filled 2020-06-03: qty 100

## 2020-06-03 MED ORDER — ONDANSETRON HCL 4 MG/2ML IJ SOLN: 4.0000 mg | Freq: Once | INTRAMUSCULAR | Status: DC | PRN

## 2020-06-03 MED ORDER — LABETALOL HCL 5 MG/ML IV SOLN
5.0000 mg | Freq: Once | INTRAVENOUS | Status: AC
  Administered 2020-06-03: 5 mg via INTRAVENOUS

## 2020-06-03 MED ORDER — SODIUM CHLORIDE 0.9 % IR SOLN
Status: DC | PRN
  Administered 2020-06-03: 1000 mL

## 2020-06-03 MED ORDER — ORAL CARE MOUTH RINSE: 15.0000 mL | Freq: Once | OROMUCOSAL | Status: AC

## 2020-06-03 MED ORDER — LIDOCAINE 2% (20 MG/ML) 5 ML SYRINGE
INTRAMUSCULAR | Status: DC | PRN
  Administered 2020-06-03: 60 mg via INTRAVENOUS

## 2020-06-03 MED ORDER — SODIUM CHLORIDE 0.9 % IR SOLN
Status: DC | PRN
  Administered 2020-06-03: 3000 mL

## 2020-06-03 MED ORDER — PROPOFOL 10 MG/ML IV BOLUS
INTRAVENOUS | Status: DC | PRN
  Administered 2020-06-03: 150 mg via INTRAVENOUS

## 2020-06-03 MED ORDER — PHENYLEPHRINE 40 MCG/ML (10ML) SYRINGE FOR IV PUSH (FOR BLOOD PRESSURE SUPPORT)
PREFILLED_SYRINGE | INTRAVENOUS | Status: AC
  Filled 2020-06-03: qty 10

## 2020-06-03 MED ORDER — CHLORHEXIDINE GLUCONATE 0.12 % MT SOLN
15.0000 mL | Freq: Once | OROMUCOSAL | Status: AC
Start: 2020-06-03 — End: 2020-06-03
  Administered 2020-06-03: 15 mL via OROMUCOSAL

## 2020-06-03 MED ORDER — PROPOFOL 10 MG/ML IV BOLUS
INTRAVENOUS | Status: AC
  Filled 2020-06-03: qty 20

## 2020-06-03 MED ORDER — ONDANSETRON HCL 4 MG/2ML IJ SOLN
INTRAMUSCULAR | Status: AC
  Filled 2020-06-03: qty 2

## 2020-06-03 MED ORDER — HYDROMORPHONE HCL 1 MG/ML IJ SOLN
INTRAMUSCULAR | Status: AC
  Filled 2020-06-03: qty 1

## 2020-06-03 MED ORDER — LABETALOL HCL 5 MG/ML IV SOLN
INTRAVENOUS | Status: DC | PRN
  Administered 2020-06-03: 5 mg via INTRAVENOUS

## 2020-06-03 MED ORDER — LIDOCAINE 2% (20 MG/ML) 5 ML SYRINGE
INTRAMUSCULAR | Status: AC
  Filled 2020-06-03: qty 5

## 2020-06-03 MED ORDER — ACETAMINOPHEN 10 MG/ML IV SOLN: 1000.0000 mg | Freq: Once | INTRAVENOUS | Status: DC | PRN

## 2020-06-03 MED ORDER — OXYCODONE HCL 5 MG/5ML PO SOLN: 5.0000 mg | Freq: Once | ORAL | Status: DC | PRN

## 2020-06-03 MED ORDER — HYDROMORPHONE HCL 1 MG/ML IJ SOLN
0.2500 mg | INTRAMUSCULAR | Status: DC | PRN
  Administered 2020-06-03: 0.5 mg via INTRAVENOUS

## 2020-06-03 MED ORDER — FENTANYL CITRATE (PF) 100 MCG/2ML IJ SOLN
INTRAMUSCULAR | Status: DC | PRN
  Administered 2020-06-03 (×2): 50 ug via INTRAVENOUS

## 2020-06-03 MED ORDER — OXYCODONE HCL 5 MG PO TABS: 5.0000 mg | ORAL_TABLET | Freq: Once | ORAL | Status: DC | PRN

## 2020-06-03 MED ORDER — 0.9 % SODIUM CHLORIDE (POUR BTL) OPTIME
TOPICAL | Status: DC | PRN
  Administered 2020-06-03: 1000 mL

## 2020-06-03 MED ORDER — DROPERIDOL 2.5 MG/ML IJ SOLN: 0.6250 mg | Freq: Once | INTRAMUSCULAR | Status: DC | PRN

## 2020-06-03 MED ORDER — CEPHALEXIN 500 MG PO CAPS: 500.0000 mg | ORAL_CAPSULE | Freq: Every day | ORAL | 0 refills | Status: AC

## 2020-06-03 MED ORDER — PHENYLEPHRINE 40 MCG/ML (10ML) SYRINGE FOR IV PUSH (FOR BLOOD PRESSURE SUPPORT)
PREFILLED_SYRINGE | INTRAVENOUS | Status: DC | PRN
  Administered 2020-06-03 (×2): 80 ug via INTRAVENOUS

## 2020-06-03 SURGICAL SUPPLY — 23 items
BAG URINE DRAIN 2000ML AR STRL (UROLOGICAL SUPPLIES) ×2 IMPLANT
BAG URO CATCHER STRL LF (MISCELLANEOUS) ×2 IMPLANT
BASKET ZERO TIP NITINOL 2.4FR (BASKET) IMPLANT
CATH INTERMIT  6FR 70CM (CATHETERS) IMPLANT
CATH SILICONE 16FRX5CC (CATHETERS) ×2 IMPLANT
CATH URET 5FR 28IN CONE TIP (BALLOONS)
CATH URET 5FR 70CM CONE TIP (BALLOONS) IMPLANT
CLOTH BEACON ORANGE TIMEOUT ST (SAFETY) ×2 IMPLANT
FIBER LASER MOSES 200 DFL (Laser) ×2 IMPLANT
GLOVE SURG ENC MOIS LTX SZ7.5 (GLOVE) ×2 IMPLANT
GOWN STRL REUS W/TWL XL LVL3 (GOWN DISPOSABLE) ×2 IMPLANT
GUIDEWIRE STR DUAL SENSOR (WIRE) ×2 IMPLANT
GUIDEWIRE ZIPWRE .038 STRAIGHT (WIRE) ×4 IMPLANT
KIT TURNOVER KIT A (KITS) ×2 IMPLANT
LASER FIB FLEXIVA PULSE ID 365 (Laser) IMPLANT
MANIFOLD NEPTUNE II (INSTRUMENTS) ×2 IMPLANT
PACK CYSTO (CUSTOM PROCEDURE TRAY) ×2 IMPLANT
SHEATH URETERAL 12FRX28CM (UROLOGICAL SUPPLIES) ×2 IMPLANT
SHEATH URETERAL 12FRX35CM (MISCELLANEOUS) IMPLANT
TRACTIP FLEXIVA PULS ID 200XHI (Laser) IMPLANT
TRACTIP FLEXIVA PULSE ID 200 (Laser)
TUBING CONNECTING 10 (TUBING) ×2 IMPLANT
TUBING UROLOGY SET (TUBING) ×2 IMPLANT

## 2020-06-03 NOTE — Op Note (Signed)
Preoperative diagnosis: Bilateral ureteral stones, bilateral renal stones Postoperative diagnosis: Right renal stones, left renal stones, left ureteral stone  Procedure: Cystoscopy with bilateral ureteroscopy laser lithotripsy and stent exchange  Surgeon: Mena Goes  Anesthesia: General  Indication procedure: Ms. Womac is a 55 year old female who developed sepsis with UTI and was found to have an 8 mm right proximal stone and a 3 mm left distal stone.  She underwent urgent stent.  She presents today for definitive stone management.  She has been on amoxicillin.  Findings: Bladder was unremarkable.  No mucosal lesion or stone.  Stents in place.  On right ureteroscopy the right proximal stone was located in the right midpole multiple mobile soft stones.  Stones all dusted.  On left ureteroscopy 3 mm stone in left distal ureter followed the semirigid scope out into the bladder on withdrawal.  No other ureteral stones.  Several left renal stones which were soft and dusted.  Description of procedure: After consent was obtained patient brought to the operating room.  After adequate anesthesia she is placed in lithotomy position prepped and draped in the usual sterile fashion.  Cystoscope was passed per urethra and the bladder inspected.  The right ureteral stent was grasped and removed through the urethral meatus.  Sensor wire was advanced up the stent and coiled in the collecting system and the stent removed.  I used a short access sheath to get 2 wires placed on the right side and then went adjacent to a zip wire leaving it as the safety wire.  Digital ureteroscope was advanced and there were no proximal stones.  Stone located in the midpole and upper pole.  The stones were dusted with a 200 m laser fiber at a setting of 0.2 and 70 and 1 and 15.  Stones were soft and readily dusted.  No other significant stone or stone fragment remained.  The access sheath was backed out on the ureteroscope and the  collecting system renal pelvis and ureter inspected on the way out and noted to be normal without injury or clinically significant stone fragment.  Attention was then turned to the left ureter where the cystoscope was used to grab the left ureteral stent it was pulled through the urethral meatus and a zip wire was advanced and coiled in the collecting system.  Semirigid scope advanced into the left distal ureter where a 3 mm stone was noted.  As I backed the scope out the stone began to follow the scope as the ureter drained and the stone followed the scope right out through the ureteral orifice into the bladder.  Reinspection of the ureter all the way up noted there to be in no other stones.  Sensor wire advanced under direct vision and the semirigid backed out.  Ureteral access sheath, short, was advanced along the zip wire leaving it as the safety wire.  The digital ureteroscope was advanced into the left kidney where several stones were noted and these were dusted with a 200 m laser fiber no other stones were noted.  The access sheath was backed out on the ureteroscope and the collecting system and renal pelvis and ureter carefully inspected noted to be free of any injury and no significant stone fragment.  The zip wire was backloaded on the cystoscope and a 6 x 24 cm stent advanced.  The wire was removed with a good coil seen in the kidney and a good coil in the bladder.  Similarly the right zip wire was backloaded on  the cystoscope and a 624 cm stent advanced.  The wire was removed good curl seen in the collecting system in the bladder.  The scope was removed and a nonlatex Foley placed in left to gravity drainage.  The stent strings were taped to the Foley.  Stent/Foley can come out Friday morning.  She was then awakened taken the cover room in stable condition.  Complications: None  Blood loss: Minimal  Specimens: None  Drains: Bilateral 6 x 24 cm ureteral stent with strings, taped to a 16 [place]  nonlatex Foley catheter  Disposition: Patient stable to PACU-I left a message for sister Steward Drone going over the procedure, postop care and follow-up.

## 2020-06-03 NOTE — Transfer of Care (Signed)
Immediate Anesthesia Transfer of Care Note  Patient: Julie Donovan  Procedure(s) Performed: CYSTOSCOPY, URETEROSCOPY, HOLMIUM LASER LITHOTRIPSY WITH STONE REMOVAL, URETERAL STENT EXCHANGE (Bilateral )  Patient Location: PACU  Anesthesia Type:General  Level of Consciousness: awake and patient cooperative  Airway & Oxygen Therapy: Patient Spontanous Breathing and Patient connected to face mask  Post-op Assessment: Report given to RN and Post -op Vital signs reviewed and stable  Post vital signs: Reviewed and stable  Last Vitals:  Vitals Value Taken Time  BP    Temp    Pulse    Resp    SpO2      Last Pain:  Vitals:   06/03/20 1127  TempSrc:   PainSc: 0-No pain         Complications: No complications documented.

## 2020-06-03 NOTE — Discharge Instructions (Signed)
Indwelling Urinary Catheter Care, Adult An indwelling urinary catheter is a thin tube that is put into your bladder. The tube helps to drain pee (urine) out of your body. The tube goes in through your urethra. Your urethra is where pee comes out of your body. Your pee will come out through the catheter, then it will go into a bag (drainage bag). Take good care of your catheter so it will work well.  Removal of the Foley: Remove the Foley catheter on Friday morning Jun 06, 2020.  Standard Foley catheter with 10 cc in the balloon. The stents will pull out as well.   How to wear your catheter and bag Supplies needed  Sticky tape (adhesive tape) or a leg strap.  Alcohol wipe or soap and water (if you use tape).  A clean towel (if you use tape).  Large overnight bag.  Smaller bag (leg bag). Wearing your catheter Attach your catheter to your leg with tape or a leg strap.  Make sure the catheter is not pulled tight.  If a leg strap gets wet, take it off and put on a dry strap.  If you use tape to hold the bag on your leg: 1. Use an alcohol wipe or soap and water to wash your skin where the tape made it sticky before. 2. Use a clean towel to pat-dry that skin. 3. Use new tape to make the bag stay on your leg. Wearing your bags You should have been given a large overnight bag.  You may wear the overnight bag in the day or night.  Always have the overnight bag lower than your bladder.  Do not let the bag touch the floor.  Before you go to sleep, put a clean plastic bag in a wastebasket. Then hang the overnight bag inside the wastebasket. You should also have a smaller leg bag that fits under your clothes.  Always wear the leg bag below your knee.  Do not wear your leg bag at night. How to care for your skin and catheter Supplies needed  A clean washcloth.  Water and mild soap.  A clean towel. Caring for your skin and catheter  Clean the skin around your catheter every  day: 1. Wash your hands with soap and water. 2. Wet a clean washcloth in warm water and mild soap. 3. Clean the skin around your urethra.  If you are female:  Gently spread the folds of skin around your vagina (labia).  With the washcloth in your other hand, wipe the inner side of your labia on each side. Wipe from front to back.  If you are female:  Pull back any skin that covers the end of your penis (foreskin).  With the washcloth in your other hand, wipe your penis in small circles. Start wiping at the tip of your penis, then move away from the catheter.  Move the foreskin back in place, if needed. 4. With your free hand, hold the catheter close to where it goes into your body.  Keep holding the catheter during cleaning so it does not get pulled out. 5. With the washcloth in your other hand, clean the catheter.  Only wipe downward on the catheter.  Do not wipe upward toward your body. Doing this may push germs into your urethra and cause infection. 6. Use a clean towel to pat-dry the catheter and the skin around it. Make sure to wipe off all soap. 7. Wash your hands with soap and water.  Shower every day. Do not take baths.  Do not use cream, ointment, or lotion on the area where the catheter goes into your body, unless your doctor tells you to.  Do not use powders, sprays, or lotions on your genital area.  Check your skin around the catheter every day for signs of infection. Check for: ? Redness, swelling, or pain. ? Fluid or blood. ? Warmth. ? Pus or a bad smell.      How to empty the bag Supplies needed  Rubbing alcohol.  Gauze pad or cotton ball.  Tape or a leg strap. Emptying the bag Pour the pee out of your bag when it is ?- full, or at least 2-3 times a day. Do this for your overnight bag and your leg bag. 1. Wash your hands with soap and water. 2. Separate (detach) the bag from your leg. 3. Hold the bag over the toilet or a clean pail. Keep the bag  lower than your hips and bladder. This is so the pee (urine) does not go back into the tube. 4. Open the pour spout. It is at the bottom of the bag. 5. Empty the pee into the toilet or pail. Do not let the pour spout touch any surface. 6. Put rubbing alcohol on a gauze pad or cotton ball. 7. Use the gauze pad or cotton ball to clean the pour spout. 8. Close the pour spout. 9. Attach the bag to your leg with tape or a leg strap. 10. Wash your hands with soap and water. Follow instructions for cleaning the drainage bag:  From the product maker.  As told by your doctor. How to change the bag Supplies needed  Alcohol wipes.  A clean bag.  Tape or a leg strap. Changing the bag Replace your bag when it starts to leak, smell bad, or look dirty. 1. Wash your hands with soap and water. 2. Separate the dirty bag from your leg. 3. Pinch the catheter with your fingers so that pee does not spill out. 4. Separate the catheter tube from the bag tube where these tubes connect (at the connection valve). Do not let the tubes touch any surface. 5. Clean the end of the catheter tube with an alcohol wipe. Use a different alcohol wipe to clean the end of the bag tube. 6. Connect the catheter tube to the tube of the clean bag. 7. Attach the clean bag to your leg with tape or a leg strap. Do not make the bag tight on your leg. 8. Wash your hands with soap and water. General rules  Never pull on your catheter. Never try to take it out. Doing that can hurt you.  Always wash your hands before and after you touch your catheter or bag. Use a mild, fragrance-free soap. If you do not have soap and water, use hand sanitizer.  Always make sure there are no twists or bends (kinks) in the catheter tube.  Always make sure there are no leaks in the catheter or bag.  Drink enough fluid to keep your pee pale yellow.  Do not take baths, swim, or use a hot tub.  If you are female, wipe from front to back after  you poop (have a bowel movement).   Contact a doctor if:  Your pee is cloudy.  Your pee smells worse than usual.  Your catheter gets clogged.  Your catheter leaks.  Your bladder feels full. Get help right away if:  You have redness, swelling,  or pain where the catheter goes into your body.  You have fluid, blood, pus, or a bad smell coming from the area where the catheter goes into your body.  Your skin feels warm where the catheter goes into your body.  You have a fever.  You have pain in your: ? Belly (abdomen). ? Legs. ? Lower back. ? Bladder.  You see blood in the catheter.  Your pee is pink or red.  You feel sick to your stomach (nauseous).  You throw up (vomit).  You have chills.  Your pee is not draining into the bag.  Your catheter gets pulled out. Summary  An indwelling urinary catheter is a thin tube that is placed into the bladder to help drain pee (urine) out of the body.  The catheter is placed into the part of the body that drains pee from the bladder (urethra).  Taking good care of your catheter will keep it working properly and help prevent problems.  Always wash your hands before and after touching your catheter or bag.  Never pull on your catheter or try to take it out. This information is not intended to replace advice given to you by your health care provider. Make sure you discuss any questions you have with your health care provider. Document Revised: 05/05/2018 Document Reviewed: 08/27/2016 Elsevier Patient Education  2021 [employer].

## 2020-06-03 NOTE — Anesthesia Postprocedure Evaluation (Signed)
Anesthesia Post Note  Patient: Julie Donovan  Procedure(s) Performed: CYSTOSCOPY, URETEROSCOPY, HOLMIUM LASER LITHOTRIPSY WITH STONE REMOVAL, URETERAL STENT EXCHANGE (Bilateral )     Patient location during evaluation: PACU Anesthesia Type: General Level of consciousness: awake and alert Pain management: pain level controlled Vital Signs Assessment: post-procedure vital signs reviewed and stable Respiratory status: spontaneous breathing, nonlabored ventilation, respiratory function stable and patient connected to nasal cannula oxygen Cardiovascular status: blood pressure returned to baseline and stable Postop Assessment: no apparent nausea or vomiting Anesthetic complications: no   No complications documented.  Last Vitals:  Vitals:   06/03/20 1030 06/03/20 1433  BP: (!) 155/95 (!) 172/91  Pulse: 81   Resp: 18   Temp: (!) 36.4 C (!) 35.9 C  SpO2: 99%     Last Pain:  Vitals:   06/03/20 1433  TempSrc:   PainSc: Asleep                 Trevor Iha

## 2020-06-03 NOTE — Anesthesia Procedure Notes (Signed)
Procedure Name: LMA Insertion Date/Time: 06/03/2020 1:07 PM Performed by: Vanessa [place], CRNA Pre-anesthesia Checklist: Emergency Drugs available, Patient identified, Suction available and Patient being monitored Patient Re-evaluated:Patient Re-evaluated prior to induction Oxygen Delivery Method: Circle system utilized Preoxygenation: Pre-oxygenation with 100% oxygen Induction Type: IV induction Ventilation: Mask ventilation without difficulty LMA: LMA inserted and LMA with gastric port inserted LMA Size: 4.0 Number of attempts: 1 Placement Confirmation: positive ETCO2 and breath sounds checked- equal and bilateral Tube secured with: Tape Dental Injury: Teeth and Oropharynx as per pre-operative assessment

## 2020-06-03 NOTE — Interval H&P Note (Signed)
History and Physical Interval Note:  06/03/2020 11:35 AM  Julie Donovan  has presented today for surgery, with the diagnosis of BILATERAL OBSTRUCTING URETERAL STONES.  The various methods of treatment have been discussed with the patient and family. After consideration of risks, benefits and other options for treatment, the patient has consented to  Procedure(s): URETEROSCOPY/HOLMIUM LASER LITHOTRIPSY WITH STONE REMOVAL/URETERAL STENT EXCHANGE (Bilateral) as a surgical intervention.  The patient's history has been reviewed, patient examined, no change in status, stable for surgery.  I have reviewed the patient's chart and labs.  Questions were answered to the patient's satisfaction.  She is well. No fever, dysuria or hematuria. Worked at [employer]. Getting gent and I added cefazolin for some gram + coverage. She's been on abx since discharge.    Jerilee Field

## 2020-06-03 NOTE — Progress Notes (Signed)
Called patients sister,Julie Donovan, at patient request.  Patient was curious if her sister was here at the hospital.  Spoke with Steward Drone, she is at home, she was unsure what day Ms.Veldman surgery was.  Informed her after the procedure the doctor would call her and update her, she voiced understanding.  Held the phone up to the patients ear so she could talk to her sister for a few minutes.  Sister has no questions/concerns at this time.

## 2020-06-04 ENCOUNTER — Encounter (HOSPITAL_COMMUNITY): Payer: Self-pay | Admitting: Urology

## 2021-06-17 ENCOUNTER — Telehealth: Payer: Self-pay

## 2021-06-17 NOTE — Telephone Encounter (Signed)
April from [facility] called requesting Dr. Clinton Gallant last office visit note from May of 2022 for provider Clent Demark. Note faxed to [phone] per request.  Wyvonne Lenz, RN

## 2022-06-16 IMAGING — CT CT ABD-PELV W/O CM
2 of 4 series · 15 of 36 positions shown, 18 images · non-contrast
Comparison: None.

CLINICAL DATA: Sepsis, nausea, vomiting, altered mental status

EXAM:
CT CHEST, ABDOMEN AND PELVIS WITHOUT CONTRAST
TECHNIQUE: Multidetector CT imaging of the chest, abdomen and pelvis was
performed following the standard protocol without IV contrast.

[Series 4: cap wo 5.0 i31f 2 · axial · 0.98mm/px · z∈[+294,+849]mm · 12 of 131 slices shown, 15 images]
[im 10/131  mediastinal]
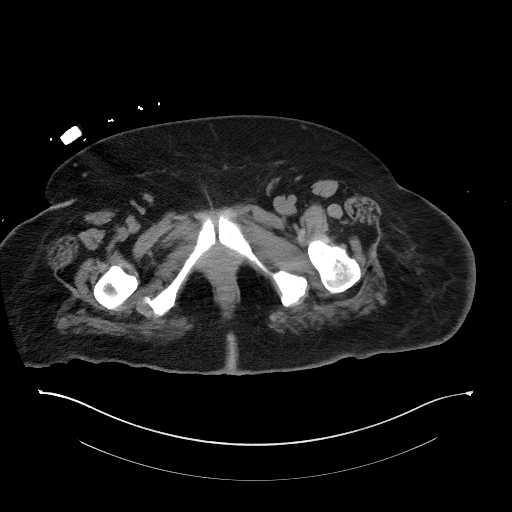
[im 10/131  lung]
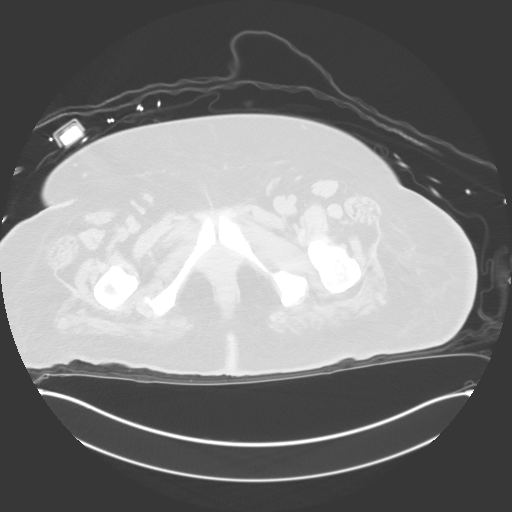
[im 19/131  lung]
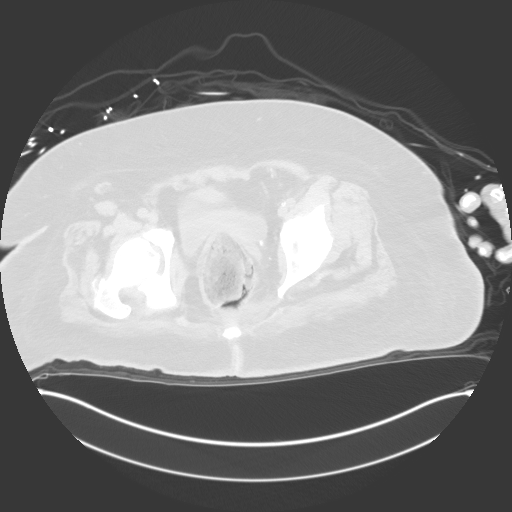
[im 28/131  lung]
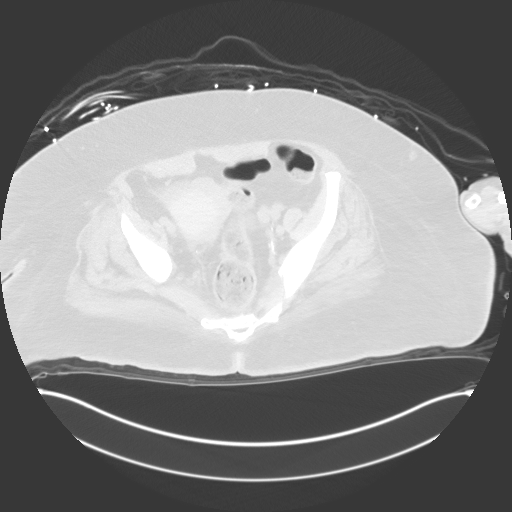
[im 38/131  lung]
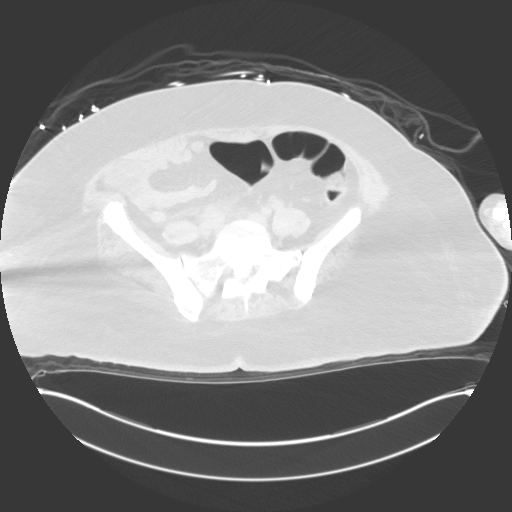
[im 47/131  mediastinal]
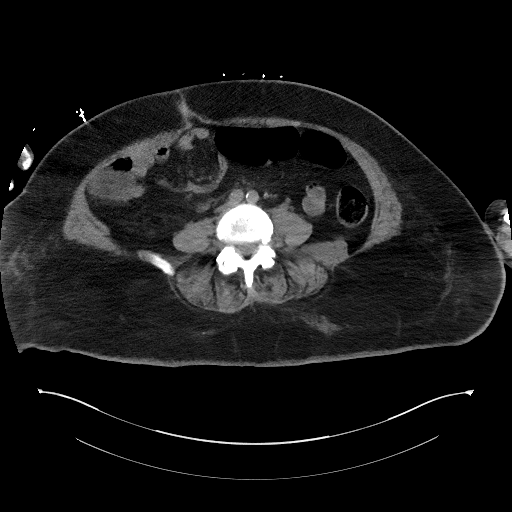
[im 47/131  lung]
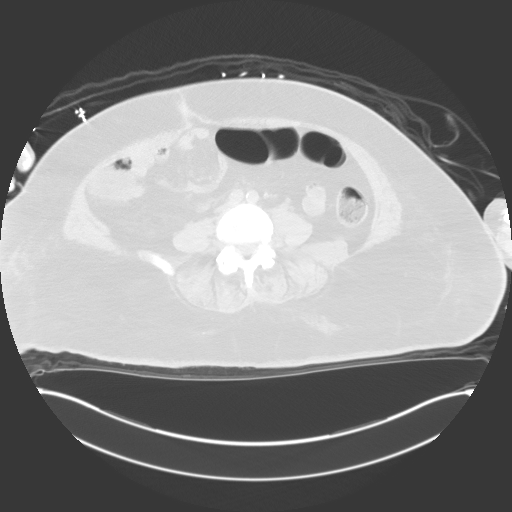
[im 56/131  lung]
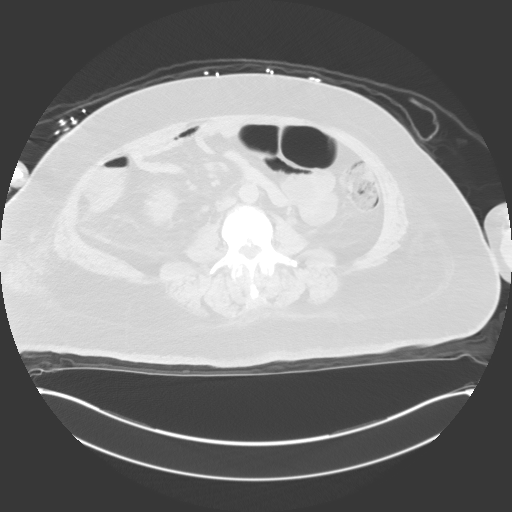
[im 75/131  lung]
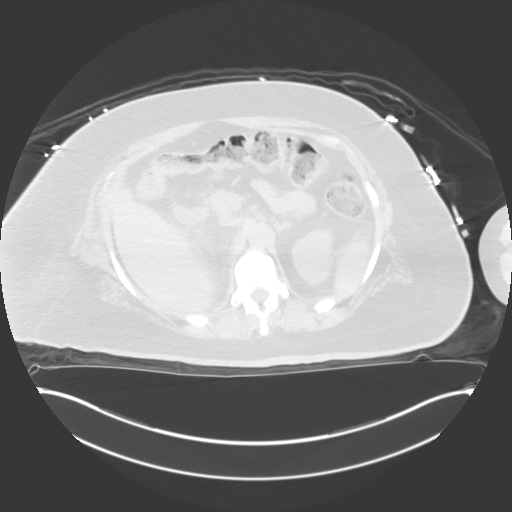
[im 84/131  lung]
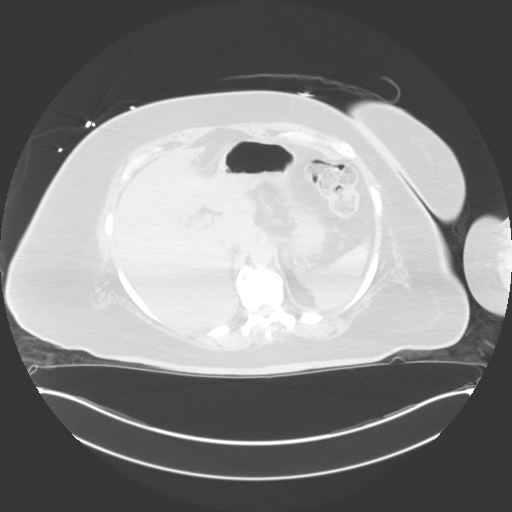
[im 93/131  mediastinal]
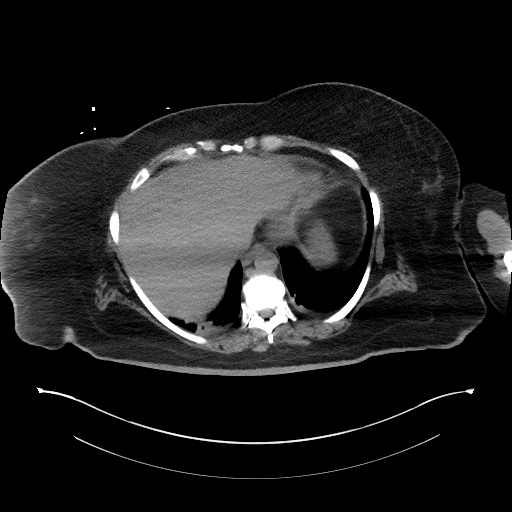
[im 93/131  lung]
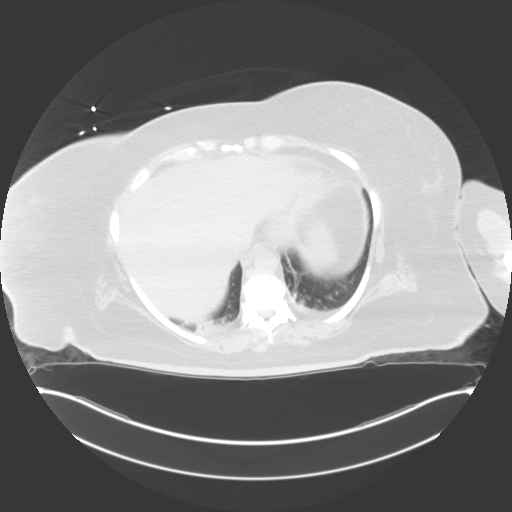
[im 103/131  lung]
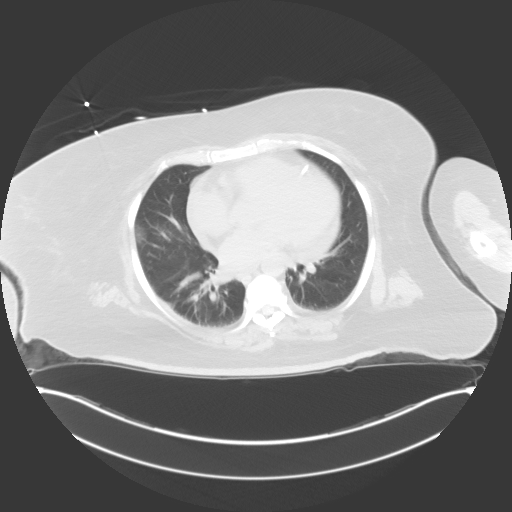
[im 112/131  lung]
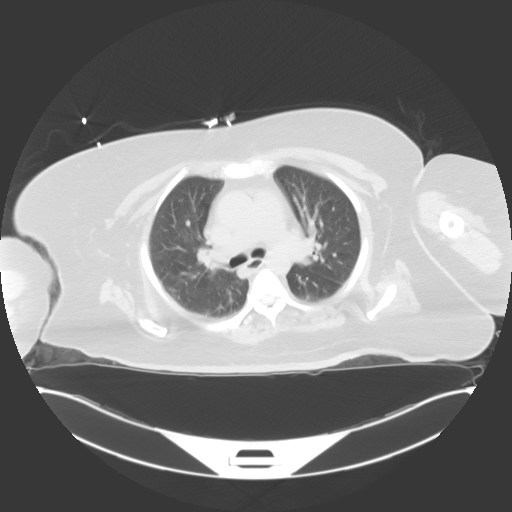
[im 121/131  lung]
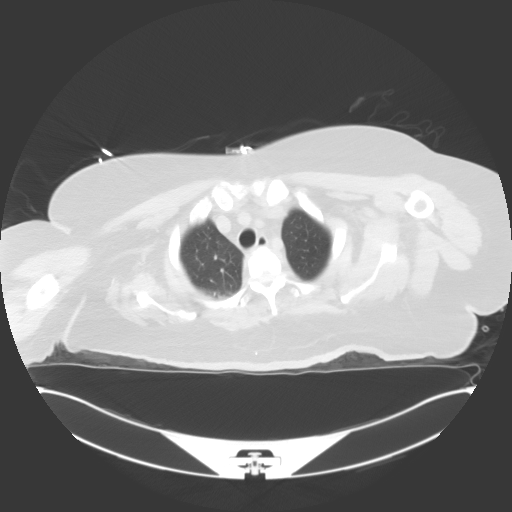

[Series 7: coronal · coronal · 0.98mm/px · 3 of 142 slices shown]
[im 29/142  lung]
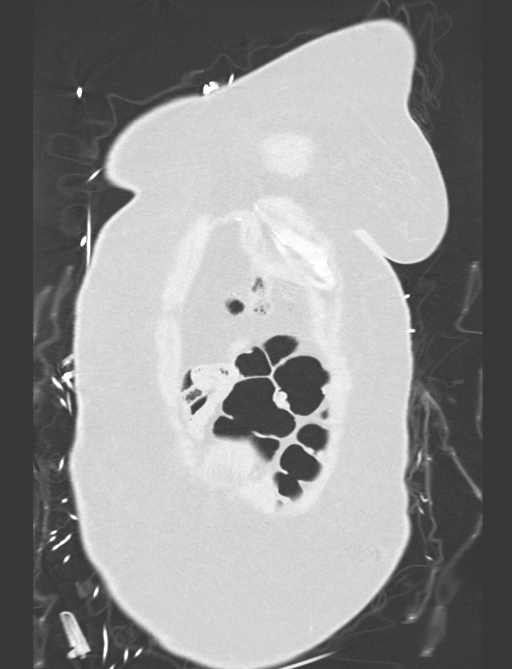
[im 57/142  lung]
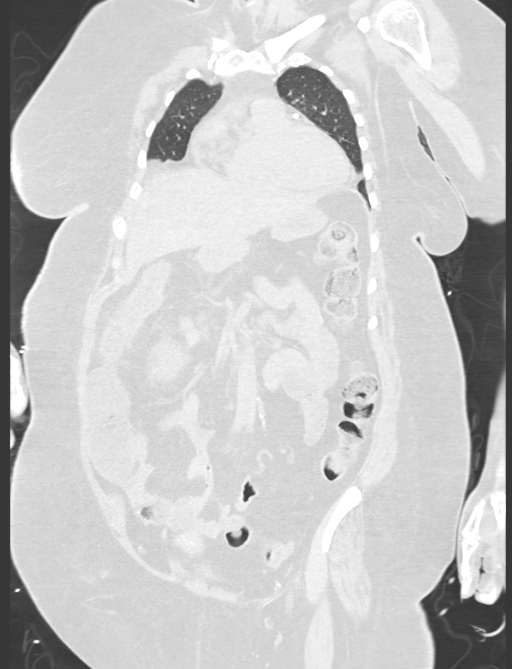
[im 85/142  lung]
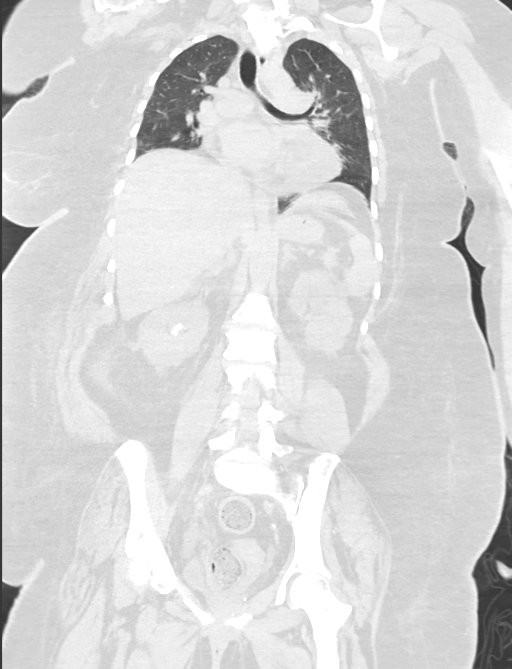

[15 of 36 positions shown; findings below may reference images not displayed]

FINDINGS: CT CHEST FINDINGS

Cardiovascular: Cardiomegaly. Moderate pericardial effusion.
Probable stents in the left anterior descending coronary artery.
Aorta normal caliber.

Mediastinum/Nodes: No mediastinal, hilar, or axillary adenopathy.
Trachea and esophagus are unremarkable. Thyroid unremarkable.

Lungs/Pleura: Bibasilar atelectasis. No effusions or other confluent
opacities.

Musculoskeletal: Chest wall soft tissues are unremarkable. No acute
bony abnormality.

CT ABDOMEN PELVIS FINDINGS

Hepatobiliary: No focal liver abnormality is seen. Status post
cholecystectomy. No biliary dilatation.

Pancreas: No focal abnormality or ductal dilatation.

Spleen: No focal abnormality.  Normal size.

Adrenals/Urinary Tract: Adrenal glands are unremarkable. Numerous
bilateral renal stones the large stone on the right layering
dependently in the renal pelvis measuring 9 mm and the largest on
the left layering dependently in the renal pelvis measuring 7 mm.
Proximal right ureteral stone measures 8 mm. Mild right
hydronephrosis. 2 mm distal left ureteral stone. Mild
pelvicaliectasis on the left. Urinary bladder decompressed,
unremarkable.

Stomach/Bowel: Stomach, large and small bowel grossly unremarkable.

Vascular/Lymphatic: No evidence of aneurysm or adenopathy. Scattered
aortoiliac atherosclerosis.

Reproductive: Uterus and adnexa unremarkable.  No mass.

Other: Trace free fluid in the pelvis.  No free air.

Musculoskeletal: No acute bony abnormality.
IMPRESSION: Cardiomegaly.  Moderate pericardial effusion.

Bibasilar atelectasis.

Bilateral nephrolithiasis.

Mild right hydronephrosis due to 8 mm proximal right ureteral stone.
Mild left pelvicaliectasis due to 2 mm left UVJ stone.

## 2022-06-16 IMAGING — DX DG CHEST 1V PORT
1 series · 1 of 1 positions shown · non-contrast
Comparison: 05/28/2019

CLINICAL DATA: Sepsis

EXAM:
PORTABLE CHEST 1 VIEW

[chest ap]
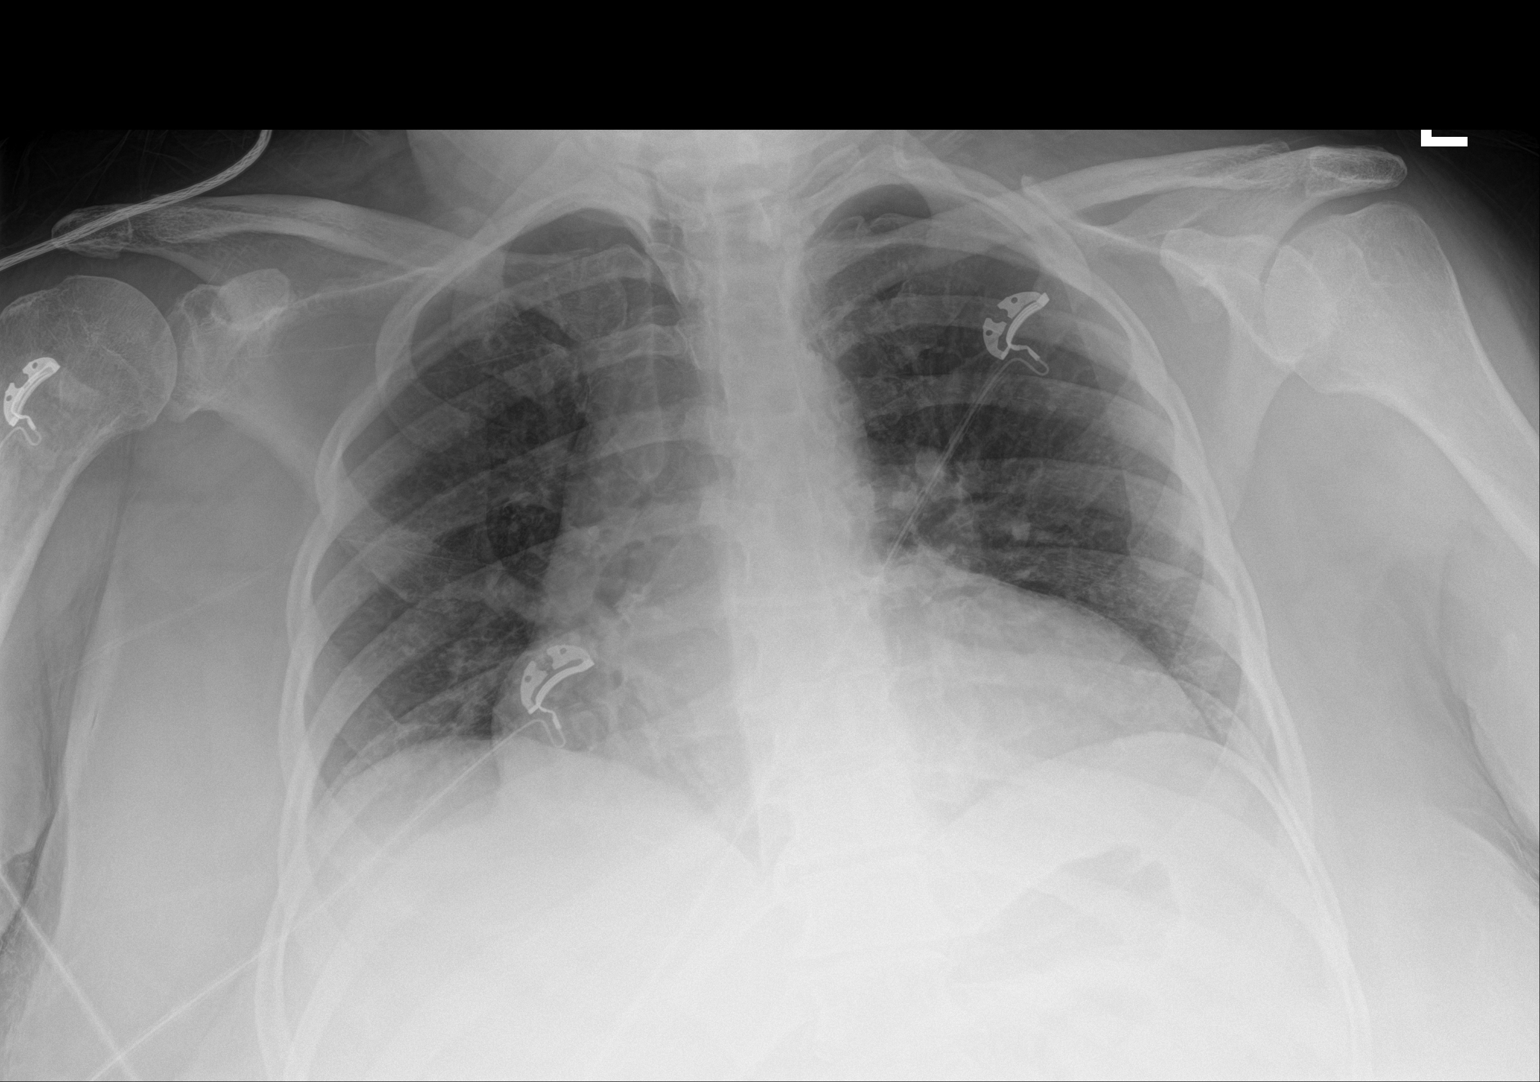

[1 of 1 positions shown; findings below may reference images not displayed]

FINDINGS: Cardiomegaly. Low lung volumes with bibasilar atelectasis. No
effusions or overt edema. No acute bony abnormality.
IMPRESSION: Low lung volumes.  Cardiomegaly, bibasilar atelectasis.
